# Patient Record
Sex: Female | Born: 1940 | Race: Black or African American | Hispanic: No | Marital: Married | Smoking: Never smoker
Health system: Southern US, Community
[De-identification: ages and names within clinical notes are randomized; demographics above are authoritative.]

## PROBLEM LIST (undated history)

## (undated) DIAGNOSIS — I10 Essential (primary) hypertension: Secondary | ICD-10-CM

## (undated) DIAGNOSIS — I2699 Other pulmonary embolism without acute cor pulmonale: Secondary | ICD-10-CM

## (undated) DIAGNOSIS — R011 Cardiac murmur, unspecified: Secondary | ICD-10-CM

## (undated) DIAGNOSIS — I82409 Acute embolism and thrombosis of unspecified deep veins of unspecified lower extremity: Secondary | ICD-10-CM

## (undated) DIAGNOSIS — M810 Age-related osteoporosis without current pathological fracture: Secondary | ICD-10-CM

## (undated) DIAGNOSIS — E7800 Pure hypercholesterolemia, unspecified: Secondary | ICD-10-CM

## (undated) DIAGNOSIS — M1990 Unspecified osteoarthritis, unspecified site: Secondary | ICD-10-CM

## (undated) HISTORY — DX: Age-related osteoporosis without current pathological fracture: M81.0

## (undated) HISTORY — PX: DILATION AND CURETTAGE OF UTERUS: SHX78

## (undated) HISTORY — PX: ABDOMINAL HYSTERECTOMY: SHX81

---
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
## 2007-11-06 ENCOUNTER — Emergency Department (HOSPITAL_COMMUNITY): Admission: EM | Admit: 2007-11-06 | Discharge: 2007-11-06 | Payer: Self-pay | Admitting: Emergency Medicine

## 2007-11-26 ENCOUNTER — Emergency Department (HOSPITAL_COMMUNITY): Admission: EM | Admit: 2007-11-26 | Discharge: 2007-11-26 | Payer: Self-pay | Admitting: Emergency Medicine

## 2008-04-08 ENCOUNTER — Emergency Department (HOSPITAL_COMMUNITY): Admission: EM | Admit: 2008-04-08 | Discharge: 2008-04-08 | Payer: Self-pay | Admitting: Emergency Medicine

## 2008-06-02 ENCOUNTER — Emergency Department (HOSPITAL_COMMUNITY): Admission: EM | Admit: 2008-06-02 | Discharge: 2008-06-02 | Payer: Self-pay | Admitting: Emergency Medicine

## 2011-03-21 DIAGNOSIS — D179 Benign lipomatous neoplasm, unspecified: Secondary | ICD-10-CM | POA: Insufficient documentation

## 2011-03-21 DIAGNOSIS — E7800 Pure hypercholesterolemia, unspecified: Secondary | ICD-10-CM | POA: Diagnosis present

## 2011-05-30 LAB — BASIC METABOLIC PANEL
BUN: 22
Calcium: 9.4
Creatinine, Ser: 1.37 — ABNORMAL HIGH
GFR calc Af Amer: 47 — ABNORMAL LOW
GFR calc non Af Amer: 38 — ABNORMAL LOW

## 2011-05-30 LAB — CBC
MCV: 88.8
Platelets: 176
RBC: 3.81 — ABNORMAL LOW
WBC: 5.4

## 2011-05-30 LAB — DIFFERENTIAL
Eosinophils Absolute: 0.1
Lymphocytes Relative: 9 — ABNORMAL LOW
Lymphs Abs: 0.5 — ABNORMAL LOW
Monocytes Relative: 9
Neutro Abs: 4.3
Neutrophils Relative %: 79 — ABNORMAL HIGH

## 2011-05-30 LAB — INFLUENZA A+B VIRUS AG-DIRECT(RAPID)
Inflenza A Ag: NEGATIVE
Influenza B Ag: NEGATIVE

## 2011-07-07 ENCOUNTER — Emergency Department (HOSPITAL_COMMUNITY): Payer: PRIVATE HEALTH INSURANCE

## 2011-07-07 ENCOUNTER — Encounter: Payer: Self-pay | Admitting: *Deleted

## 2011-07-07 ENCOUNTER — Emergency Department (HOSPITAL_COMMUNITY)
Admission: EM | Admit: 2011-07-07 | Discharge: 2011-07-07 | Disposition: A | Discharge status: Home / Self Care | Payer: PRIVATE HEALTH INSURANCE | Attending: Emergency Medicine | Admitting: Emergency Medicine

## 2011-07-07 DIAGNOSIS — S92919A Unspecified fracture of unspecified toe(s), initial encounter for closed fracture: Secondary | ICD-10-CM | POA: Insufficient documentation

## 2011-07-07 DIAGNOSIS — W2203XA Walked into furniture, initial encounter: Secondary | ICD-10-CM | POA: Insufficient documentation

## 2011-07-07 DIAGNOSIS — S92502A Displaced unspecified fracture of left lesser toe(s), initial encounter for closed fracture: Secondary | ICD-10-CM

## 2011-07-07 DIAGNOSIS — M79609 Pain in unspecified limb: Secondary | ICD-10-CM | POA: Insufficient documentation

## 2011-07-07 HISTORY — DX: Pure hypercholesterolemia, unspecified: E78.00

## 2011-07-07 NOTE — ED Notes (Signed)
Pt c/o injury to her left little toe last Friday. Pt states that it is swollen and painful.

## 2011-07-07 NOTE — ED Provider Notes (Signed)
Scribed for Raeford Razor, MD, the patient was seen in room APFT20/APFT20 . This chart was scribed by Ellie Lunch.   CSN: [id number] Arrival date & time: 07/07/2011  3:23 PM   First MD Initiated Contact with Patient 07/07/11 1621      Chief Complaint  Patient presents with  . Toe Injury    (Consider location/radiation/quality/duration/timing/severity/associated sxs/prior treatment) HPI Nicole Good is a 70 y.o. female who presents to the Emergency Department complaining of toe injury. Pt kicked a toe planter with her left foot ~10 days ago. Pt complains of pain in her 5th digit on her left foot. Pt describes pain as a constant soreness. Nothing makes pain better. Pain aggravated by high heels. Denies pain in any other digit or area. Pt has not treated with any pain medication. Pt has treated with epsom salt soak, alcohol, and cold compress with no improvement. There are no other associated symptoms and no other alleviating or aggravating factors.     Past Medical History  Diagnosis Date  . High cholesterol     Past Surgical History  Procedure Date  . Dilitation and curretage   . Abdominal hysterectomy     History reviewed. No pertinent family history.  History  Substance Use Topics  . Smoking status: Never Smoker   . Smokeless tobacco: Not on file  . Alcohol Use: No    Review of Systems 10 Systems reviewed and are negative for acute change except as noted in the HPI.   Allergies  Review of patient's allergies indicates no known allergies.  Home Medications   Current Outpatient Rx  Name Route Sig Dispense Refill  . ATORVASTATIN CALCIUM 10 MG PO TABS Oral Take 10 mg by mouth at bedtime.      . METHYLCELLULOSE 1 % OP SOLN Both Eyes Place 1 drop into both eyes as needed. For dry eye relief     . THERA M PLUS PO TABS Oral Take 1 tablet by mouth daily.        BP 113/88  Pulse 115  Temp(Src) 98.3 F (36.8 C) (Oral)  Resp 20  Ht 5\' 5"  (1.651 m)  Wt 170 lb  (77.111 kg)  BMI 28.29 kg/m2  SpO2 100%  Physical Exam  Nursing note and vitals reviewed. Constitutional: She is oriented to person, place, and time. She appears well-developed and well-nourished.  HENT:  Head: Normocephalic and atraumatic.  Eyes: Conjunctivae and EOM are normal.  Neck: Normal range of motion. Neck supple.  Cardiovascular: Normal rate, regular rhythm and normal heart sounds.   Pulmonary/Chest: Effort normal and breath sounds normal.  Abdominal: Soft. There is no tenderness.  Musculoskeletal: Normal range of motion.       Mild tenderness 5th digit of left foot. No appreciable swelling or erythema. Good flexion and extension. Neurovascularly intact.   Neurological: She is alert and oriented to person, place, and time.  Skin: Skin is warm and dry.  Psychiatric: She has a normal mood and affect.    ED Course  Procedures (including critical care time)  OTHER DATA REVIEWED: Nursing notes, vital signs, and past medical records reviewed.  DIAGNOSTIC STUDIES: Oxygen Saturation is 100% on room air, normal by my interpretation.    Dg Toe 5th Left  07/07/2011  *RADIOLOGY REPORT*  Clinical Data: Left little toe pain after stubbing toe on 07/04/2011  LEFT TOE - 2+ VIEW  Comparison: None  Findings: Three views are performed, showing an oblique fracture of the proximal phalanx of the fifth  digit.  There is deformity of the fifth metatarsal, consistent with old injury.  There is soft tissue swelling of the fifth digit.  No radiopaque foreign body or soft tissue gas.  IMPRESSION:  Acute fracture of the proximal phalanx of the fifth digit.  Original Report Authenticated By: Patterson Hammersmith, M.D.   4:29 PM EDP at PT bedside discussed with PT that xray shows a fracture in 5th digit. Discussed plan to buddy tape 5th digit and  discharge.    1. Fracture of fifth toe, left, closed       MDM  70yF with closed fx of proximal phalanx L 5th toe. Pt declining pain meds. Instructed  to take buddy tape, wear hard soled shoes and tylenol/nsaids prn pain. Ortho referral given if needed.   I personally preformed the services scribed in my presence. The recorded information has been reviewed and considered. Raeford Razor, MD.        Raeford Razor, MD 07/07/11 [phone]

## 2011-12-03 ENCOUNTER — Encounter (HOSPITAL_COMMUNITY): Payer: Self-pay

## 2011-12-03 ENCOUNTER — Emergency Department (HOSPITAL_COMMUNITY)
Admission: EM | Admit: 2011-12-03 | Discharge: 2011-12-03 | Disposition: A | Discharge status: Home / Self Care | Payer: Medicare PPO | Attending: Emergency Medicine | Admitting: Emergency Medicine

## 2011-12-03 ENCOUNTER — Other Ambulatory Visit: Payer: Self-pay

## 2011-12-03 DIAGNOSIS — I499 Cardiac arrhythmia, unspecified: Secondary | ICD-10-CM | POA: Insufficient documentation

## 2011-12-03 DIAGNOSIS — R002 Palpitations: Secondary | ICD-10-CM

## 2011-12-03 DIAGNOSIS — G4700 Insomnia, unspecified: Secondary | ICD-10-CM

## 2011-12-03 DIAGNOSIS — E7800 Pure hypercholesterolemia, unspecified: Secondary | ICD-10-CM | POA: Insufficient documentation

## 2011-12-03 LAB — COMPREHENSIVE METABOLIC PANEL
AST: 22 U/L (ref 0–37)
Albumin: 4 g/dL (ref 3.5–5.2)
Alkaline Phosphatase: 76 U/L (ref 39–117)
BUN: 25 mg/dL — ABNORMAL HIGH (ref 6–23)
Potassium: 4 mEq/L (ref 3.5–5.1)
Total Protein: 7.3 g/dL (ref 6.0–8.3)

## 2011-12-03 MED ORDER — ZOLPIDEM TARTRATE 5 MG PO TABS: 5.0000 mg | ORAL_TABLET | Freq: Every evening | ORAL | Status: DC | PRN

## 2011-12-03 NOTE — Discharge Instructions (Signed)
Make an appointment with either the cardiologist or your primary care doctor to arrange a Holter monitor to evaluate or extra heart beats.  Palpitations  A palpitation is the feeling that your heartbeat is irregular or is faster than normal. Although this is frightening, it usually is not serious. Palpitations may be caused by excesses of smoking, caffeine, or alcohol. They are also brought on by stress and anxiety. Sometimes, they are caused by heart disease. Unless otherwise noted, your caregiver did not find any signs of serious illness at this time. HOME CARE INSTRUCTIONS  To help prevent palpitations:  Drink decaffeinated coffee, tea, and soda pop. Avoid chocolate.   If you smoke or drink alcohol, quit or cut down as much as possible.   Reduce your stress or anxiety level. Biofeedback, yoga, or meditation will help you relax. Physical activity such as swimming, jogging, or walking also may be helpful.  SEEK MEDICAL CARE IF:   You continue to have a fast heartbeat.   Your palpitations occur more often.  SEEK IMMEDIATE MEDICAL CARE IF: You develop chest pain, shortness of breath, severe headache, dizziness, or fainting. Document Released: 08/22/2000 Document Revised: 08/14/2011 Document Reviewed: 10/22/2007 [facility] Patient Information 2012 [place], [place].  Insomnia Insomnia is frequent trouble falling and/or staying asleep. Insomnia can be a long term problem or a short term problem. Both are common. Insomnia can be a short term problem when the wakefulness is related to a certain stress or worry. Long term insomnia is often related to ongoing stress during waking hours and/or poor sleeping habits. Overtime, sleep deprivation itself can make the problem worse. Every little thing feels more severe because you are overtired and your ability to cope is decreased. CAUSES   Stress, anxiety, and depression.   Poor sleeping habits.   Distractions such as TV in the bedroom.   Naps close  to bedtime.   Engaging in emotionally charged conversations before bed.   Technical reading before sleep.   Alcohol and other sedatives. They may make the problem worse. They can hurt normal sleep patterns and normal dream activity.   Stimulants such as caffeine for several hours prior to bedtime.   Pain syndromes and shortness of breath can cause insomnia.   Exercise late at night.   Changing time zones may cause sleeping problems (jet lag).  It is sometimes helpful to have someone observe your sleeping patterns. They should look for periods of not breathing during the night (sleep apnea). They should also look to see how long those periods last. If you live alone or observers are uncertain, you can also be observed at a sleep clinic where your sleep patterns will be professionally monitored. Sleep apnea requires a checkup and treatment. Give your caregivers your medical history. Give your caregivers observations your family has made about your sleep.  SYMPTOMS   Not feeling rested in the morning.   Anxiety and restlessness at bedtime.   Difficulty falling and staying asleep.  TREATMENT   Your caregiver may prescribe treatment for an underlying medical disorders. Your caregiver can give advice or help if you are using alcohol or other drugs for self-medication. Treatment of underlying problems will usually eliminate insomnia problems.   Medications can be prescribed for short time use. They are generally not recommended for lengthy use.   Over-the-counter sleep medicines are not recommended for lengthy use. They can be habit forming.   You can promote easier sleeping by making lifestyle changes such as:   Using relaxation techniques  that help with breathing and reduce muscle tension.   Exercising earlier in the day.   Changing your diet and the time of your last meal. No night time snacks.   Establish a regular time to go to bed.   Counseling can help with stressful problems  and worry.   Soothing music and white noise may be helpful if there are background noises you cannot remove.   Stop tedious detailed work at least one hour before bedtime.  HOME CARE INSTRUCTIONS   Keep a diary. Inform your caregiver about your progress. This includes any medication side effects. See your caregiver regularly. Take note of:   Times when you are asleep.   Times when you are awake during the night.   The quality of your sleep.   How you feel the next day.  This information will help your caregiver care for you.  Get out of bed if you are still awake after 15 minutes. Read or do some quiet activity. Keep the lights down. Wait until you feel sleepy and go back to bed.   Keep regular sleeping and waking hours. Avoid naps.   Exercise regularly.   Avoid distractions at bedtime. Distractions include watching television or engaging in any intense or detailed activity like attempting to balance the household checkbook.   Develop a bedtime ritual. Keep a familiar routine of bathing, brushing your teeth, climbing into bed at the same time each night, listening to soothing music. Routines increase the success of falling to sleep faster.   Use relaxation techniques. This can be using breathing and muscle tension release routines. It can also include visualizing peaceful scenes. You can also help control troubling or intruding thoughts by keeping your mind occupied with boring or repetitive thoughts like the old concept of counting sheep. You can make it more creative like imagining planting one beautiful flower after another in your backyard garden.   During your day, work to eliminate stress. When this is not possible use some of the previous suggestions to help reduce the anxiety that accompanies stressful situations.  MAKE SURE YOU:   Understand these instructions.   Will watch your condition.   Will get help right away if you are not doing well or get worse.  Document  Released: 08/22/2000 Document Revised: 08/14/2011 Document Reviewed: 09/22/2007 [facility] Patient Information 2012 [place], [place].  Zolpidem tablets What is this medicine? ZOLPIDEM (zole PI dem) is used to treat insomnia. This medicine helps you to fall asleep and sleep through the night. This medicine may be used for other purposes; ask your health care provider or pharmacist if you have questions. What should I tell my health care provider before I take this medicine? They need to know if you have any of these conditions: -depression -history of a drug or alcohol abuse problem -liver disease -lung or breathing disease -suicidal thoughts -an unusual or allergic reaction to zolpidem, other medicines, foods, dyes, or preservatives -pregnant or trying to get pregnant -breast-feeding How should I use this medicine? Take this medicine by mouth with a glass of water. Follow the directions on the prescription label. It is better to take this medicine on an empty stomach and only when you are ready for bed. Do not take your medicine more often than directed. If you have been taking this medicine for several weeks and suddenly stop taking it, you may get unpleasant withdrawal symptoms. Your doctor or health care professional may want to gradually reduce the dose. Do not stop  taking this medicine on your own. Always follow your doctor or health care professional's advice. A special MedGuide will be given to you by the pharmacist with each prescription and refill. Be sure to read this information carefully each time. Talk to your pediatrician regarding the use of this medicine in children. Special care may be needed. Overdosage: If you think you have taken too much of this medicine contact a poison control center or emergency room at once. NOTE: This medicine is only for you. Do not share this medicine with others. What if I miss a dose? This does not apply. This medicine should only be taken  immediately before going to sleep. Do not take double or extra doses. What may interact with this medicine? -herbal medicines like kava kava, melatonin, St. John's wort and valerian -medicines for fungal infections like ketoconazole, fluconazole, or itraconazole -medicines for treating depression or other mental problems -other medicines given for sleep -some medicines for Parkinson' s disease or other movement disorders -some medicines used to treat HIV infection or AIDS, like ritonavir This list may not describe all possible interactions. Give your health care provider a list of all the medicines, herbs, non-prescription drugs, or dietary supplements you use. Also tell them if you smoke, drink alcohol, or use illegal drugs. Some items may interact with your medicine. What should I watch for while using this medicine? Visit your doctor or health care professional for regular checks on your progress. Keep a regular sleep schedule by going to bed at about the same time each night. Avoid caffeine-containing drinks in the evening hours. When sleep medicines are used every night for more than a few weeks, they may stop working. Talk to your doctor if you still have trouble sleeping. Do not take this medicine unless you are able to get a full night's sleep before you must be active again. You may not be able to remember things that you do in the hours after you take this medicine. Some people have reported driving, making phone calls, or preparing and eating food while asleep after taking sleep medicine. Take this medicine right before going to sleep. Tell your doctor if you are have any problems with your memory. After you stop taking this medicine, you may have trouble falling asleep. This is called rebound insomnia. This problem usually goes away on its own after 1 or 2 nights. You may get drowsy or dizzy. Do not drive, use machinery, or do anything that needs mental alertness until you know how this  medicine affects you. Do not stand or sit up quickly, especially if you are an older patient. This reduces the risk of dizzy or fainting spells. Alcohol may interfere with the effect of this medicine. Avoid alcoholic drinks. If you or your family notice any changes in your behavior, or if you have any unusual or disturbing thoughts, call your doctor right away. What side effects may I notice from receiving this medicine? Side effects that you should report to your doctor or health care professional as soon as possible: -allergic reactions like skin rash, itching or hives, swelling of the face, lips, or tongue -changes in vision -confusion -depressed mood -feeling faint or lightheaded, falls -hallucinations -problems with balance, speaking, walking -restlessness, excitability, or feelings of agitation -unusual activities while asleep like driving, eating, making phone calls Side effects that usually do not require medical attention (report to your doctor or health care professional if they continue or are bothersome): -diarrhea -dizziness, or daytime drowsiness, sometimes  called a hangover effect -headache This list may not describe all possible side effects. Call your doctor for medical advice about side effects. You may report side effects to FDA at 1-800-FDA-1088. Where should I keep my medicine? Keep out of the reach of children. This medicine can be abused. Keep your medicine in a safe place to protect it from theft. Do not share this medicine with anyone. Selling or giving away this medicine is dangerous and against the law. Store at room temperature between 20 and 25 degrees C (68 and 77 degrees F). Throw away any unused medicine after the expiration date. NOTE: This sheet is a summary. It may not cover all possible information. If you have questions about this medicine, talk to your doctor, pharmacist, or health care provider.  2012, [employer]. (02/28/2008 9:46:47 PM)

## 2011-12-03 NOTE — ED Notes (Signed)
Pt reports her father recently died and for the past 5 days has noticed her heat "skipping beats" intermittently.  Reports has been feeling very sleepy and tired and is  Unusual for her.  Denies cp, sob, or dizziness.

## 2011-12-03 NOTE — ED Provider Notes (Signed)
History     CSN: [id number]  Arrival date & time 12/03/11  1253   First MD Initiated Contact with Patient 12/03/11 1312      Chief Complaint  Patient presents with  . Irregular Heart Beat    (Consider location/radiation/quality/duration/timing/severity/associated sxs/prior treatment) The history is provided by the patient.   71 year old female has noticed extra heartbeats for the last 5 days. They are coming rather infrequently averaging about one hour. There is no associated chest pain, heaviness, tightness, or pressure. She denies dizziness or lightheadedness. She denies dyspnea, nausea, vomiting, diaphoresis. She has been under stress for the last several months following the death of her father and states that she has not been sleeping well during this time. She denies depression, crying spells, anhedonia. She has noted that the extra beats do not seem to occur when she is active. She's not had problems with extra beats before.  Past Medical History  Diagnosis Date  . High cholesterol     Past Surgical History  Procedure Date  . Dilitation and curretage   . Abdominal hysterectomy     No family history on file.  History  Substance Use Topics  . Smoking status: Never Smoker   . Smokeless tobacco: Not on file  . Alcohol Use: No    OB History    Grav Para Term Preterm Abortions TAB SAB Ect Mult Living                  Review of Systems  All other systems reviewed and are negative.    Allergies  Review of patient's allergies indicates no known allergies.  Home Medications   Current Outpatient Rx  Name Route Sig Dispense Refill  . ATORVASTATIN CALCIUM 10 MG PO TABS Oral Take 10 mg by mouth at bedtime.      . METHYLCELLULOSE 1 % OP SOLN Both Eyes Place 1 drop into both eyes as needed. For dry eye relief     . THERA M PLUS PO TABS Oral Take 1 tablet by mouth daily.        BP 123/78  Pulse 71  Temp 98.1 F (36.7 C)  Resp 20  Ht 5\' 5"  (1.651 m)  Wt 178 lb  (80.74 kg)  BMI 29.62 kg/m2  SpO2 97%  Physical Exam  Nursing note and vitals reviewed.  70 year old female who is resting comfortably and in no acute distress. Vital signs are normal. Oxygen saturation is 97% which is normal. Head is normocephalic and atraumatic. PERRLA, EOMI. Oropharynx is clear. Neck is nontender and supple without adenopathy or JVD. There is no thyromegaly. Lungs are clear without rales, wheezes, or rhonchi. Heart has regular rate and rhythm without murmur. Abdomen is soft, flat, nontender without masses or hepatosplenomegaly. Extremities have no cyanosis or edema, full range of motion is present. Skin is warm and dry without rash. Neurologic: Mental status is normal, cranial nerves are intact, there are no focal motor or sensory deficits.  ED Course  Procedures (including critical care time)  Results for orders placed during the hospital encounter of 12/03/11  COMPREHENSIVE METABOLIC PANEL      Component Value Range   Sodium 137  135 - 145 (mEq/L)   Potassium 4.0  3.5 - 5.1 (mEq/L)   Chloride 99  96 - 112 (mEq/L)   CO2 28  19 - 32 (mEq/L)   Glucose, Bld 108 (*) 70 - 99 (mg/dL)   BUN 25 (*) 6 - 23 (mg/dL)   Creatinine, Ser  1.24 (*) 0.50 - 1.10 (mg/dL)   Calcium 19.1  8.4 - 10.5 (mg/dL)   Total Protein 7.3  6.0 - 8.3 (g/dL)   Albumin 4.0  3.5 - 5.2 (g/dL)   AST 22  0 - 37 (U/L)   ALT 22  0 - 35 (U/L)   Alkaline Phosphatase 76  39 - 117 (U/L)   Total Bilirubin 0.3  0.3 - 1.2 (mg/dL)   GFR calc non Af Amer 43 (*) >90 (mL/min)   GFR calc Af Amer 49 (*) >90 (mL/min)     Date: 12/03/2011  Rate: 67  Rhythm: normal sinus rhythm  QRS Axis: normal  Intervals: normal  ST/T Wave abnormalities: normal  Conduction Disutrbances:none  Narrative Interpretation: Borderline left atrial hypertrophy, otherwise normal ECG. No old ECG available for comparison.  Old EKG Reviewed: none available  Electrolytes are normal. Mild renal insufficiency is unchanged from baseline. No  evidence of hepatic dysfunction. She will be given a prescription for Ambien to treat her sleeping difficulty, and referred to cardiology to arrange for a Holter monitor. She's also given the option of having a Holter monitor arranged through her primary care provider.  1. Palpitations   2. Insomnia       MDM  Probable symptomatic PVCs. Insomnia. Electrolytes will be checked. Since she is on a statin, I will order a complete metabolic panel to also evaluate for any possible liver dysfunction.        Dione Booze, MD 12/03/11 1414

## 2011-12-03 NOTE — ED Notes (Signed)
Pt denies pain but states she has noticed an intermittent extra "thump" x 5 days. NAD.

## 2012-02-05 DIAGNOSIS — Z6826 Body mass index (BMI) 26.0-26.9, adult: Secondary | ICD-10-CM | POA: Insufficient documentation

## 2012-09-23 ENCOUNTER — Emergency Department (HOSPITAL_COMMUNITY)
Admission: EM | Admit: 2012-09-23 | Discharge: 2012-09-23 | Disposition: A | Discharge status: Home / Self Care | Payer: Medicare PPO | Attending: Emergency Medicine | Admitting: Emergency Medicine

## 2012-09-23 ENCOUNTER — Encounter (HOSPITAL_COMMUNITY): Payer: Self-pay | Admitting: *Deleted

## 2012-09-23 ENCOUNTER — Emergency Department (HOSPITAL_COMMUNITY): Payer: Medicare PPO

## 2012-09-23 DIAGNOSIS — R5381 Other malaise: Secondary | ICD-10-CM | POA: Insufficient documentation

## 2012-09-23 DIAGNOSIS — Z7982 Long term (current) use of aspirin: Secondary | ICD-10-CM | POA: Insufficient documentation

## 2012-09-23 DIAGNOSIS — R42 Dizziness and giddiness: Secondary | ICD-10-CM | POA: Insufficient documentation

## 2012-09-23 DIAGNOSIS — R531 Weakness: Secondary | ICD-10-CM

## 2012-09-23 DIAGNOSIS — Z79899 Other long term (current) drug therapy: Secondary | ICD-10-CM | POA: Insufficient documentation

## 2012-09-23 DIAGNOSIS — I10 Essential (primary) hypertension: Secondary | ICD-10-CM | POA: Insufficient documentation

## 2012-09-23 DIAGNOSIS — E7800 Pure hypercholesterolemia, unspecified: Secondary | ICD-10-CM | POA: Insufficient documentation

## 2012-09-23 HISTORY — DX: Essential (primary) hypertension: I10

## 2012-09-23 LAB — URINALYSIS, ROUTINE W REFLEX MICROSCOPIC
Hgb urine dipstick: NEGATIVE
Leukocytes, UA: NEGATIVE
Nitrite: NEGATIVE
Protein, ur: NEGATIVE mg/dL
Urobilinogen, UA: 0.2 mg/dL (ref 0.0–1.0)

## 2012-09-23 LAB — COMPREHENSIVE METABOLIC PANEL
BUN: 22 mg/dL (ref 6–23)
CO2: 30 mEq/L (ref 19–32)
Calcium: 10.4 mg/dL (ref 8.4–10.5)
Creatinine, Ser: 1.27 mg/dL — ABNORMAL HIGH (ref 0.50–1.10)
GFR calc Af Amer: 48 mL/min — ABNORMAL LOW (ref >90)
GFR calc non Af Amer: 41 mL/min — ABNORMAL LOW (ref >90)
Glucose, Bld: 99 mg/dL (ref 70–99)

## 2012-09-23 LAB — CBC WITH DIFFERENTIAL/PLATELET
Eosinophils Relative: 7 % — ABNORMAL HIGH (ref 0–5)
HCT: 37.9 % (ref 36.0–46.0)
Lymphocytes Relative: 23 % (ref 12–46)
Lymphs Abs: 1.3 10*3/uL (ref 0.7–4.0)
MCH: 30.7 pg (ref 26.0–34.0)
MCV: 92.2 fL (ref 78.0–100.0)
Monocytes Absolute: 0.3 10*3/uL (ref 0.1–1.0)
RBC: 4.11 MIL/uL (ref 3.87–5.11)
WBC: 5.5 10*3/uL (ref 4.0–10.5)

## 2012-09-23 LAB — TROPONIN I: Troponin I: <0.3 ng/mL (ref <0.30)

## 2012-09-23 NOTE — ED Notes (Signed)
Pt alert & oriented x4, stable gait. Patient given discharge instructions, paperwork & prescription(s). Patient  instructed to stop at the registration desk to finish any additional paperwork. Patient verbalized understanding. Pt left department w/ no further questions. 

## 2012-09-23 NOTE — ED Provider Notes (Signed)
History     CSN: [id number]  Arrival date & time 09/23/12  1420   First MD Initiated Contact with Patient 09/23/12 1610      Chief Complaint  Patient presents with  . Dizziness  . Weakness    (Consider location/radiation/quality/duration/timing/severity/associated sxs/prior treatment) HPI....generalized weakness for approximately 8 days. Slight dizziness. Generally, patient is in good health.  Nothing makes symptoms better or worse. Severity is moderate.   No fever, sweats, chills, dysuria, cough, dyspnea, stiff neck, weight loss.  Past Medical History  Diagnosis Date  . High cholesterol   . Hypertension     Past Surgical History  Procedure Date  . Dilitation and curretage   . Abdominal hysterectomy     No family history on file.  History  Substance Use Topics  . Smoking status: Never Smoker   . Smokeless tobacco: Not on file  . Alcohol Use: No    OB History    Grav Para Term Preterm Abortions TAB SAB Ect Mult Living                  Review of Systems  All other systems reviewed and are negative.    Allergies  Review of patient's allergies indicates no known allergies.  Home Medications   Current Outpatient Rx  Name  Route  Sig  Dispense  Refill  . ASPIRIN EC 81 MG PO TBEC   Oral   Take 81 mg by mouth every morning.          . ATORVASTATIN CALCIUM 10 MG PO TABS   Oral   Take 10 mg by mouth at bedtime.          . CHOLECALCIFEROL 400 UNITS PO TABS   Oral   Take 400 Units by mouth every morning.          Marland Kitchen OMEGA-3 FATTY ACIDS 1000 MG PO CAPS   Oral   Take 1 g by mouth daily.         Marland Kitchen HYDROCHLOROTHIAZIDE 25 MG PO TABS   Oral   Take 25 mg by mouth every morning.          Carma Leaven M PLUS PO TABS   Oral   Take 1 tablet by mouth every morning.            BP 150/65  Pulse 54  Temp 98 F (36.7 C) (Oral)  Resp 20  Ht 5\' 6"  (1.676 m)  Wt 180 lb (81.647 kg)  BMI 29.05 kg/m2  SpO2 95%  Physical Exam  Nursing note and vitals  reviewed. Constitutional: She is oriented to person, place, and time. She appears well-developed and well-nourished.  HENT:  Head: Normocephalic and atraumatic.  Eyes: Conjunctivae normal and EOM are normal. Pupils are equal, round, and reactive to light.  Neck: Normal range of motion. Neck supple.  Cardiovascular: Normal rate, regular rhythm and normal heart sounds.   Pulmonary/Chest: Effort normal and breath sounds normal.  Abdominal: Soft. Bowel sounds are normal.  Musculoskeletal: Normal range of motion.  Neurological: She is alert and oriented to person, place, and time.  Skin: Skin is warm and dry.  Psychiatric: She has a normal mood and affect.    ED Course  Procedures (including critical care time)  Labs Reviewed  CBC WITH DIFFERENTIAL - Abnormal; Notable for the following:    Eosinophils Relative 7 (*)     All other components within normal limits  COMPREHENSIVE METABOLIC PANEL - Abnormal; Notable for the following:  Creatinine, Ser 1.27 (*)     Total Bilirubin 0.1 (*)     GFR calc non Af Amer 41 (*)     GFR calc Af Amer 48 (*)     All other components within normal limits  TROPONIN I  URINALYSIS, ROUTINE W REFLEX MICROSCOPIC  TSH   Dg Chest 2 View  09/23/2012  *RADIOLOGY REPORT*  Clinical Data: Dizziness, weakness.  Flu-like symptoms.  CHEST - 2 VIEW  Comparison: 11/06/2007  Findings: Heart is upper limits normal in size.  Lungs are clear. No effusions or acute bony abnormality.  IMPRESSION: No acute cardiopulmonary disease.   Original Report Authenticated By: Charlett Nose, M.D.      1. Weakness       MDM  Patient has normal physical exam. Screening test negative. His primary care followup. No neuro deficits       Donnetta Hutching, MD 09/23/12 [phone]

## 2012-09-23 NOTE — ED Notes (Signed)
Pt states for past 8-9 days she "hasn't felt right" and c/o generalized weakness and intermittent dizziness. Pt denies any pain, SOB, NVD, headaches. Pt states "I just stay tired".

## 2012-09-23 NOTE — ED Notes (Signed)
Pt states that for the past two weeks she has been weak, off balance, dizzy, denies any pain ,sob, recent illness. Denies any weakness on one side vs other, no problems with thought process or speech.

## 2012-09-24 LAB — TSH: TSH: 0.576 u[IU]/mL (ref 0.350–4.500)

## 2013-06-01 ENCOUNTER — Emergency Department (HOSPITAL_COMMUNITY): Payer: Medicare PPO

## 2013-06-01 ENCOUNTER — Emergency Department (HOSPITAL_COMMUNITY)
Admission: EM | Admit: 2013-06-01 | Discharge: 2013-06-01 | Disposition: A | Discharge status: Home / Self Care | Payer: Medicare PPO | Attending: Emergency Medicine | Admitting: Emergency Medicine

## 2013-06-01 ENCOUNTER — Encounter (HOSPITAL_COMMUNITY): Payer: Self-pay | Admitting: Emergency Medicine

## 2013-06-01 DIAGNOSIS — K5900 Constipation, unspecified: Secondary | ICD-10-CM

## 2013-06-01 DIAGNOSIS — Z7982 Long term (current) use of aspirin: Secondary | ICD-10-CM | POA: Diagnosis not present

## 2013-06-01 DIAGNOSIS — E7800 Pure hypercholesterolemia, unspecified: Secondary | ICD-10-CM | POA: Diagnosis not present

## 2013-06-01 DIAGNOSIS — K649 Unspecified hemorrhoids: Secondary | ICD-10-CM | POA: Insufficient documentation

## 2013-06-01 DIAGNOSIS — Z79899 Other long term (current) drug therapy: Secondary | ICD-10-CM | POA: Diagnosis not present

## 2013-06-01 DIAGNOSIS — I10 Essential (primary) hypertension: Secondary | ICD-10-CM | POA: Insufficient documentation

## 2013-06-01 MED ORDER — FLEET ENEMA 7-19 GM/118ML RE ENEM
1.0000 | ENEMA | Freq: Once | RECTAL | Status: AC
  Administered 2013-06-01: 1 via RECTAL

## 2013-06-01 NOTE — ED Notes (Signed)
Fleet enema given by Dr. Adriana Simas with RN at bedside. Pt had small BM shortly after BM was given. Pt tolerated procedure well.

## 2013-06-01 NOTE — ED Provider Notes (Signed)
CSN: [id number]     Arrival date & time 06/01/13  1111 History  This chart was scribed for Nicole Hutching, MD by Nicole Good, ED scribe.  This patient was seen in room [facility] and the patient's care was started at 11:50 AM.  Chief Complaint  Patient presents with  . Constipation  . Hemorrhoids    The history is provided by the patient. No language interpreter was used.    HPI Comments: Nicole Good is a 72 y.o. female who presents to the Emergency Department complaining of constipation that has been present intermittently for 2-3 months and most recently has been ongoing for one week.  Pt states that she last had a normal BM 1 week ago and since then has had only sporadic small stools and has been straining.  Her last BM was yesterday,  She has attempted to treat constipation with prune juice and stool softener, without relief.  Pt also expresses concern she may have hemorrhoids secondary to her constipation.  She notes that she received an enema 5 weeks ago.   Past Medical History  Diagnosis Date  . High cholesterol   . Hypertension     Past Surgical History  Procedure Laterality Date  . Dilitation and curretage    . Abdominal hysterectomy      Family History  Problem Relation Age of Onset  . Stroke Mother     History  Substance Use Topics  . Smoking status: Never Smoker   . Smokeless tobacco: Never Used  . Alcohol Use: No    OB History   Grav Para Term Preterm Abortions TAB SAB Ect Mult Living   3 1 1  2  1 1  1       Review of Systems A complete 10 system review of systems was obtained and all systems are negative except as noted in the HPI and PMH.    Allergies  Review of patient's allergies indicates no known allergies.  Home Medications   Current Outpatient Rx  Name  Route  Sig  Dispense  Refill  . aspirin EC 81 MG tablet   Oral   Take 81 mg by mouth every morning.          Marland Kitchen atorvastatin (LIPITOR) 10 MG tablet   Oral   Take 10 mg by  mouth at bedtime.          . cholecalciferol (VITAMIN D) 400 UNITS TABS   Oral   Take 400 Units by mouth every morning.          . fish oil-omega-3 fatty acids 1000 MG capsule   Oral   Take 1 g by mouth daily.         . hydrochlorothiazide (HYDRODIURIL) 25 MG tablet   Oral   Take 25 mg by mouth every morning.          . Multiple Vitamins-Minerals (MULTIVITAMINS THER. W/MINERALS) TABS   Oral   Take 1 tablet by mouth every morning.           BP 122/67  Pulse 73  Temp(Src) 98 F (36.7 C) (Oral)  Resp 18  Ht 5' 5.5" (1.664 m)  Wt 162 lb (73.483 kg)  BMI 26.54 kg/m2  SpO2 100%  Physical Exam  Nursing note and vitals reviewed. Constitutional: She is oriented to person, place, and time. She appears well-developed and well-nourished.  HENT:  Head: Normocephalic and atraumatic.  Eyes: Conjunctivae and EOM are normal. Pupils are equal, round, and reactive to  light.  Neck: Normal range of motion. Neck supple.  Cardiovascular: Normal rate, regular rhythm and normal heart sounds.   Pulmonary/Chest: Effort normal and breath sounds normal.  Abdominal: Soft. Bowel sounds are normal. There is no tenderness.  Musculoskeletal: Normal range of motion.  Neurological: She is alert and oriented to person, place, and time.  Skin: Skin is warm and dry.  Psychiatric: She has a normal mood and affect.    ED Course  Procedures (including critical care time)  DIAGNOSTIC STUDIES: Oxygen Saturation is 100% on room air, normal by my interpretation.    COORDINATION OF CARE: 11:56 AM-Discussed treatment plan which includes abdomen x-ray and rectal exam with pt at bedside and pt agreed to plan.   Results for orders placed during the hospital encounter of 09/23/12  CBC WITH DIFFERENTIAL      Result Value Range   WBC 5.5  4.0 - 10.5 K/uL   RBC 4.11  3.87 - 5.11 MIL/uL   Hemoglobin 12.6  12.0 - 15.0 g/dL   HCT 45.4  09.8 - 11.9 %   MCV 92.2  78.0 - 100.0 fL   MCH 30.7  26.0 - 34.0  pg   MCHC 33.2  30.0 - 36.0 g/dL   RDW 14.7  82.9 - 56.2 %   Platelets 200  150 - 400 K/uL   Neutrophils Relative % 64  43 - 77 %   Neutro Abs 3.5  1.7 - 7.7 K/uL   Lymphocytes Relative 23  12 - 46 %   Lymphs Abs 1.3  0.7 - 4.0 K/uL   Monocytes Relative 5  3 - 12 %   Monocytes Absolute 0.3  0.1 - 1.0 K/uL   Eosinophils Relative 7 (*) 0 - 5 %   Eosinophils Absolute 0.4  0.0 - 0.7 K/uL   Basophils Relative 0  0 - 1 %   Basophils Absolute 0.0  0.0 - 0.1 K/uL  COMPREHENSIVE METABOLIC PANEL      Result Value Range   Sodium 138  135 - 145 mEq/L   Potassium 4.1  3.5 - 5.1 mEq/L   Chloride 102  96 - 112 mEq/L   CO2 30  19 - 32 mEq/L   Glucose, Bld 99  70 - 99 mg/dL   BUN 22  6 - 23 mg/dL   Creatinine, Ser 1.30 (*) 0.50 - 1.10 mg/dL   Calcium 86.5  8.4 - 78.4 mg/dL   Total Protein 7.8  6.0 - 8.3 g/dL   Albumin 4.1  3.5 - 5.2 g/dL   AST 23  0 - 37 U/L   ALT 24  0 - 35 U/L   Alkaline Phosphatase 75  39 - 117 U/L   Total Bilirubin 0.1 (*) 0.3 - 1.2 mg/dL   GFR calc non Af Amer 41 (*) >90 mL/min   GFR calc Af Amer 48 (*) >90 mL/min  TROPONIN I      Result Value Range   Troponin I <0.30  <0.30 ng/mL  URINALYSIS, ROUTINE W REFLEX MICROSCOPIC      Result Value Range   Color, Urine YELLOW  YELLOW   APPearance CLEAR  CLEAR   Specific Gravity, Urine 1.010  1.005 - 1.030   pH 7.0  5.0 - 8.0   Glucose, UA NEGATIVE  NEGATIVE mg/dL   Hgb urine dipstick NEGATIVE  NEGATIVE   Bilirubin Urine NEGATIVE  NEGATIVE   Ketones, ur NEGATIVE  NEGATIVE mg/dL   Protein, ur NEGATIVE  NEGATIVE mg/dL  Urobilinogen, UA 0.2  0.0 - 1.0 mg/dL   Nitrite NEGATIVE  NEGATIVE   Leukocytes, UA NEGATIVE  NEGATIVE  TSH      Result Value Range   TSH 0.576  0.350 - 4.500 uIU/mL    Dg Abd 1 View  06/01/2013   CLINICAL DATA:  Constipation for 3 months, hemorrhoids  EXAM: ABDOMEN - 1 VIEW  COMPARISON:  None  FINDINGS: Increased stool throughout colon.  Nonobstructive bowel gas pattern with gas present to rectum.  No  definite bowel wall thickening.  Bones appear mildly demineralized.  Small left pelvic phlebolith.  IMPRESSION: Increased stool throughout colon.   Electronically Signed   By: Ulyses Southward M.D.   On: 06/01/2013 11:56      MDM  No diagnosis found.  No acute abdomen.  Digital exam shows no obvious  stool in rectum.   I personally performed the services described in this documentation, which was scribed in my presence. The recorded information has been reviewed and is accurate.    Nicole Hutching, MD 06/06/13 1655

## 2013-06-01 NOTE — ED Notes (Signed)
Per c/o constipation x6 months. Patient reports taking stool softner and prune juice with no relief. Per patient last BM yesterday, soft, but "not a lot of stool." Patient also reports hemorrhoids from the constipation.

## 2013-06-01 NOTE — ED Notes (Signed)
Pt reports constipation and possible hemorrhoid x5-6 months. Pt states she has been using prune juice and OTC stool softener for symptoms and states are "usually" effective. Pt's last BM was last night however she reports it was very small. Pt denies abdominal pain and also denies any blood in her stools.

## 2014-07-04 DIAGNOSIS — K642 Third degree hemorrhoids: Secondary | ICD-10-CM | POA: Insufficient documentation

## 2014-07-04 HISTORY — DX: Third degree hemorrhoids: K64.2

## 2014-07-10 ENCOUNTER — Encounter (HOSPITAL_COMMUNITY): Payer: Self-pay | Admitting: Emergency Medicine

## 2015-09-08 ENCOUNTER — Emergency Department (HOSPITAL_COMMUNITY): Payer: Medicare PPO

## 2015-09-08 ENCOUNTER — Emergency Department (HOSPITAL_COMMUNITY)
Admission: EM | Admit: 2015-09-08 | Discharge: 2015-09-08 | Disposition: A | Discharge status: Home / Self Care | Payer: Medicare PPO | Attending: Emergency Medicine | Admitting: Emergency Medicine

## 2015-09-08 ENCOUNTER — Encounter (HOSPITAL_COMMUNITY): Payer: Self-pay | Admitting: Emergency Medicine

## 2015-09-08 DIAGNOSIS — Z86711 Personal history of pulmonary embolism: Secondary | ICD-10-CM | POA: Insufficient documentation

## 2015-09-08 DIAGNOSIS — E7800 Pure hypercholesterolemia, unspecified: Secondary | ICD-10-CM | POA: Insufficient documentation

## 2015-09-08 DIAGNOSIS — J40 Bronchitis, not specified as acute or chronic: Secondary | ICD-10-CM

## 2015-09-08 DIAGNOSIS — R05 Cough: Secondary | ICD-10-CM | POA: Diagnosis present

## 2015-09-08 DIAGNOSIS — Z7982 Long term (current) use of aspirin: Secondary | ICD-10-CM | POA: Diagnosis not present

## 2015-09-08 DIAGNOSIS — J209 Acute bronchitis, unspecified: Secondary | ICD-10-CM | POA: Diagnosis not present

## 2015-09-08 DIAGNOSIS — Z79899 Other long term (current) drug therapy: Secondary | ICD-10-CM | POA: Diagnosis not present

## 2015-09-08 DIAGNOSIS — I10 Essential (primary) hypertension: Secondary | ICD-10-CM | POA: Insufficient documentation

## 2015-09-08 HISTORY — DX: Other pulmonary embolism without acute cor pulmonale: I26.99

## 2015-09-08 MED ORDER — AZITHROMYCIN 250 MG PO TABS: ORAL_TABLET | ORAL | Status: DC

## 2015-09-08 NOTE — ED Provider Notes (Signed)
CSN: [id number]     Arrival date & time 09/08/15  [id number] History   First MD Initiated Contact with Patient 09/08/15 [phone]     Chief Complaint  Patient presents with  . Cough     (Consider location/radiation/quality/duration/timing/severity/associated sxs/prior Treatment) Patient is a 74 y.o. female presenting with cough. The history is provided by the patient. No language interpreter was used.  Cough Cough characteristics:  Productive Sputum characteristics:  White Severity:  Moderate Onset quality:  Gradual Duration:  5 days Timing:  Constant Progression:  Worsening Chronicity:  New Smoker: no   Context: upper respiratory infection   Relieved by:  Cough suppressants Worsened by:  Nothing tried Ineffective treatments:  None tried Associated symptoms: no rhinorrhea   Risk factors: no recent infection   Pt complains of cough.  No fever  Past Medical History  Diagnosis Date  . High cholesterol   . Hypertension   . Pulmonary emboli [facility])    Past Surgical History  Procedure Laterality Date  . Dilitation and curretage    . Abdominal hysterectomy     Family History  Problem Relation Age of Onset  . Stroke Mother    Social History  Substance Use Topics  . Smoking status: Never Smoker   . Smokeless tobacco: Never Used  . Alcohol Use: No   OB History    Gravida Para Term Preterm AB TAB SAB Ectopic Multiple Living   3 1 1  2  1 1  1      Review of Systems  HENT: Negative for rhinorrhea.   Respiratory: Positive for cough.   All other systems reviewed and are negative.     Allergies  Review of patient's allergies indicates no known allergies.  Home Medications   Prior to Admission medications   Medication Sig Start Date End Date Taking? Authorizing Provider  aspirin EC 81 MG tablet Take 81 mg by mouth every morning.     Historical Provider, MD  atorvastatin (LIPITOR) 10 MG tablet Take 10 mg by mouth at bedtime.     Historical Provider, MD  cholecalciferol  (VITAMIN D) 400 UNITS TABS Take 400 Units by mouth every morning.     Historical Provider, MD  fish oil-omega-3 fatty acids 1000 MG capsule Take 1 g by mouth daily.    Historical Provider, MD  hydrochlorothiazide (HYDRODIURIL) 25 MG tablet Take 25 mg by mouth every morning.     Historical Provider, MD  Multiple Vitamins-Minerals (MULTIVITAMINS THER. W/MINERALS) TABS Take 1 tablet by mouth every morning.     Historical Provider, MD   BP 121/63 mmHg  Pulse 62  Temp(Src) 98.6 F (37 C) (Oral)  Resp 18  Ht 5\' 6"  (1.676 m)  Wt 73.483 kg  BMI 26.16 kg/m2  SpO2 99% Physical Exam  Constitutional: She is oriented to person, place, and time. She appears well-developed and well-nourished.  HENT:  Head: Normocephalic and atraumatic.  Eyes: Conjunctivae and EOM are normal. Pupils are equal, round, and reactive to light.  Neck: Normal range of motion.  Cardiovascular: Normal rate and normal heart sounds.   Pulmonary/Chest: Effort normal.  Abdominal: She exhibits no distension.  Musculoskeletal: Normal range of motion.  Neurological: She is alert and oriented to person, place, and time.  Skin: Skin is warm.  Psychiatric: She has a normal mood and affect.  Nursing note and vitals reviewed.   ED Course  Procedures (including critical care time) Labs Review Labs Reviewed - No data to display  Imaging Review Dg Chest  2 View  09/08/2015  CLINICAL DATA:  Cough.  Chest congestion starting on last Tuesday. EXAM: CHEST  2 VIEW COMPARISON:  09/23/2012 FINDINGS: Tortuous thoracic aorta with atherosclerotic calcification of the arch. Mild enlargement of the cardiopericardial silhouette, without edema. The lungs appear otherwise clear.  No pleural effusion. Based on a horizontal central retrocardiac lucency I suspect the presence of a small hiatal hernia. IMPRESSION: 1. Mild enlargement of the cardiopericardial silhouette, without edema. 2. Small hiatal hernia. 3. Atherosclerotic thoracic aorta.  Electronically Signed   By: Van Clines M.D.   On: 09/08/2015 08:39   I have personally reviewed and evaluated these images and lab results as part of my medical decision-making.   EKG Interpretation None      MDMnormal temp, normal pulse and 99%02.  I suspect bronchitis.  Pt advised to follow up with her MD for recheck this week   Final diagnoses:  Bronchitis    Meds ordered this encounter  Medications  . azithromycin (ZITHROMAX Z-PAK) 250 MG tablet    Sig: 2 tablets on first day, then 1 tablet a day on days 2-5    Dispense:  6 each    Refill:  0    Order Specific Question:  Supervising Provider    Answer:  Noemi Chapel [place], PA-C 09/08/15 [place], MD 09/08/15 [phone]

## 2015-09-08 NOTE — Discharge Instructions (Signed)

## 2015-09-08 NOTE — ED Notes (Signed)
Pt states that she has had a cough with white sputum since Tuesday.

## 2015-09-09 HISTORY — PX: CATARACT EXTRACTION W/ INTRAOCULAR LENS  IMPLANT, BILATERAL: SHX1307

## 2015-10-02 ENCOUNTER — Emergency Department (HOSPITAL_COMMUNITY)
Admission: EM | Admit: 2015-10-02 | Discharge: 2015-10-02 | Disposition: A | Discharge status: Home / Self Care | Payer: Medicare PPO | Attending: Emergency Medicine | Admitting: Emergency Medicine

## 2015-10-02 ENCOUNTER — Emergency Department (HOSPITAL_COMMUNITY): Payer: Medicare PPO

## 2015-10-02 ENCOUNTER — Encounter (HOSPITAL_COMMUNITY): Payer: Self-pay | Admitting: *Deleted

## 2015-10-02 DIAGNOSIS — Z7982 Long term (current) use of aspirin: Secondary | ICD-10-CM | POA: Insufficient documentation

## 2015-10-02 DIAGNOSIS — E7800 Pure hypercholesterolemia, unspecified: Secondary | ICD-10-CM | POA: Diagnosis not present

## 2015-10-02 DIAGNOSIS — M542 Cervicalgia: Secondary | ICD-10-CM | POA: Diagnosis present

## 2015-10-02 DIAGNOSIS — M4802 Spinal stenosis, cervical region: Secondary | ICD-10-CM | POA: Insufficient documentation

## 2015-10-02 DIAGNOSIS — R202 Paresthesia of skin: Secondary | ICD-10-CM | POA: Insufficient documentation

## 2015-10-02 DIAGNOSIS — I10 Essential (primary) hypertension: Secondary | ICD-10-CM | POA: Diagnosis not present

## 2015-10-02 DIAGNOSIS — Z79899 Other long term (current) drug therapy: Secondary | ICD-10-CM | POA: Insufficient documentation

## 2015-10-02 DIAGNOSIS — Z86711 Personal history of pulmonary embolism: Secondary | ICD-10-CM | POA: Diagnosis not present

## 2015-10-02 MED ORDER — DEXAMETHASONE 4 MG PO TABS: 4.0000 mg | ORAL_TABLET | Freq: Two times a day (BID) | ORAL | Status: DC

## 2015-10-02 MED ORDER — MELOXICAM 7.5 MG PO TABS: 7.5000 mg | ORAL_TABLET | Freq: Two times a day (BID) | ORAL | Status: DC

## 2015-10-02 NOTE — ED Notes (Signed)
Pt c/o right sided neck pain x 4 days. States "weird sensation continues from neck down to right arm and hand". Denies tingling/burning/numbness. Does report doing yard work last Thursday that involved cutting down trees.

## 2015-10-02 NOTE — Discharge Instructions (Signed)
The CT scan of his cervical spine is negative for fracture or dislocation, and there is no evidence of herniated disc. There is noted narrowing of the foramina, or the tract where the smaller branches of nerve, all the spinal cord area. Please use the Decadron 2 times daily, and Mobic 2 times daily after a meal. Please see Dr. Berenice Primas, or the orthopedic specialist of your choice for orthopedic evaluation of this issue.

## 2015-10-02 NOTE — ED Provider Notes (Signed)
CSN: [id number]     Arrival date & time 10/02/15  1218 History   First MD Initiated Contact with Patient 10/02/15 1244     Chief Complaint  Patient presents with  . Neck Pain     (Consider location/radiation/quality/duration/timing/severity/associated sxs/prior Treatment) HPI Comments: Patient is a 75 year old female who presents to the emergency department with right-sided neck pain.  The patient states that over the last 4 days she's been having increasing pain in the mid to lower right neck, and this is extending into the right arm and occasionally into the right hand. The patient denies dropping any objects. The patient denies problems using the hand. She states that one or 2 days when this started she was cutting down lamp's and small trees. She states that after this the problem seemed to have gotten worse. She has a sensation of pins and needles at times that goes from the neck to the shoulder and down the arm. No other problems noted. The patient particularly denies any weakness of the upper or lower extremities. There's been no difficulty with speech, or swallowing. The patient has no problems with balance or walking.  Patient is a 75 y.o. female presenting with neck pain. The history is provided by the patient.  Neck Pain Pain location:  R side   Past Medical History  Diagnosis Date  . High cholesterol   . Hypertension   . Pulmonary emboli [facility])    Past Surgical History  Procedure Laterality Date  . Dilitation and curretage    . Abdominal hysterectomy     Family History  Problem Relation Age of Onset  . Stroke Mother    Social History  Substance Use Topics  . Smoking status: Never Smoker   . Smokeless tobacco: Never Used  . Alcohol Use: No   OB History    Gravida Para Term Preterm AB TAB SAB Ectopic Multiple Living   3 1 1  2  1 1  1      Review of Systems  Musculoskeletal: Positive for arthralgias and neck pain.  All other systems reviewed and are  negative.     Allergies  Review of patient's allergies indicates no known allergies.  Home Medications   Prior to Admission medications   Medication Sig Start Date End Date Taking? Authorizing Provider  aspirin EC 81 MG tablet Take 81 mg by mouth every morning.    Yes Historical Provider, MD  atorvastatin (LIPITOR) 10 MG tablet Take 10 mg by mouth at bedtime.    Yes Historical Provider, MD  fish oil-omega-3 fatty acids 1000 MG capsule Take 1 g by mouth daily.   Yes Historical Provider, MD  hydrochlorothiazide (HYDRODIURIL) 25 MG tablet Take 25 mg by mouth every morning.    Yes Historical Provider, MD  Multiple Vitamins-Minerals (MULTIVITAMINS THER. W/MINERALS) TABS Take 1 tablet by mouth every morning.    Yes Historical Provider, MD  azithromycin (ZITHROMAX Z-PAK) 250 MG tablet 2 tablets on first day, then 1 tablet a day on days 2-5 Patient not taking: Reported on 10/02/2015 09/08/15   Fransico Meadow, PA-C  dexamethasone (DECADRON) 4 MG tablet Take 1 tablet (4 mg total) by mouth 2 (two) times daily with a meal. 10/02/15   Lily Kocher, PA-C  meloxicam (MOBIC) 7.5 MG tablet Take 1 tablet (7.5 mg total) by mouth 2 (two) times daily after a meal. 10/02/15   Lily Kocher, PA-C   BP 135/67 mmHg  Pulse 70  Temp(Src) 98.1 F (36.7 C) (Temporal)  Resp 16  Ht 5' 5.5" (1.664 m)  Wt 75.297 kg  BMI 27.19 kg/m2  SpO2 99% Physical Exam  Constitutional: She is oriented to person, place, and time. She appears well-developed and well-nourished.  Non-toxic appearance.  HENT:  Head: Normocephalic.  Right Ear: Tympanic membrane and external ear normal.  Left Ear: Tympanic membrane and external ear normal.  Eyes: EOM and lids are normal. Pupils are equal, round, and reactive to light.  Neck: Normal range of motion. Neck supple. Carotid bruit is not present.  Cardiovascular: Normal rate, regular rhythm, normal heart sounds, intact distal pulses and normal pulses.   Pulmonary/Chest: Breath sounds  normal. No respiratory distress.  Abdominal: Soft. Bowel sounds are normal. There is no tenderness. There is no guarding.  Musculoskeletal: Normal range of motion.       Cervical back: She exhibits tenderness and spasm. She exhibits normal range of motion and no deformity.  There is soreness of the mid to lower lateral right neck. There is full range of motion of the right shoulder. There is full range of motion of the right elbow wrist and fingers.  Lymphadenopathy:       Head (right side): No submandibular adenopathy present.       Head (left side): No submandibular adenopathy present.    She has no cervical adenopathy.  Neurological: She is alert and oriented to person, place, and time. She has normal strength. No cranial nerve deficit or sensory deficit.  Grip is symmetrical. No motor deficit noted. Pt is ambulatory without problem.  Skin: Skin is warm and dry.  Psychiatric: She has a normal mood and affect. Her speech is normal.  Nursing note and vitals reviewed.   ED Course  Procedures (including critical care time) Labs Review Labs Reviewed - No data to display  Imaging Review Ct Cervical Spine Wo Contrast  10/02/2015  CLINICAL DATA:  Study 66-year-old presenting with a 4 day history of right-sided neck pain with right upper extremity paresthesias. Patient reports that she performed a large amount of yard work at home 5 days ago which included cutting down trees. No injuries, however. EXAM: CT CERVICAL SPINE WITHOUT CONTRAST TECHNIQUE: Multidetector CT imaging of the cervical spine was performed without intravenous contrast. Multiplanar CT image reconstructions were also generated. COMPARISON:  None. FINDINGS: No fractures identified involving the cervical spine. Sagittal reconstructed images demonstrate very slight (2 mm) spondylolisthesis of C4 on C5, degenerative in nature as there are severe facet degenerative changes at this level. Facet joints intact throughout with diffuse  degenerative changes. Mild disc space narrowing at C4-5 and C6-7. Moderate disc space narrowing at C5-6 with vacuum phenomenon. No spinal stenosis. Soft tissue window images demonstrate no overt disc extrusions. Coronal reformatted images demonstrate an intact craniocervical junction, intact C1-C2 articulation, intact dens, and intact lateral masses throughout. Predominately facet hypertrophy accounts for multilevel foraminal stenoses including mild left C3-4, severe bilateral C5-6, severe bilateral C6-7, and mild bilateral C7-T1. IMPRESSION: 1. No acute osseous abnormality. 2. Degenerative changes throughout the cervical spine as detailed above with multilevel foraminal stenoses and no evidence of spinal stenosis. Electronically Signed   By: Evangeline Dakin M.D.   On: 10/02/2015 14:22   I have personally reviewed and evaluated these images and lab results as part of my medical decision-making.   EKG Interpretation None      MDM  Vital signs are stable. The CT C-spine is negative for acute bone issue. There are multiple levels of degenerative changes through out the  C-spine, especially noted is foraminal stenosis. Pt denies dropping objects or controlling the upper extremities. Plan - orthopedic consult with Dr Berenice Primas. Rx for decadron and Mobic 7.5 bid with meal given to the patient.   Final diagnoses:  Foraminal stenosis of cervical region  Paresthesia of right arm    *I have reviewed nursing notes, vital signs, and all appropriate lab and imaging results for this patient.Lily Kocher, PA-C 10/02/15 1513  Milton Ferguson, MD 10/02/15 [phone]

## 2015-10-30 ENCOUNTER — Ambulatory Visit (INDEPENDENT_AMBULATORY_CARE_PROVIDER_SITE_OTHER): Payer: Medicare FFS | Admitting: Neurology

## 2015-10-30 ENCOUNTER — Encounter: Payer: Self-pay | Admitting: Neurology

## 2015-10-30 VITALS — BP 139/84 | HR 72 | Ht 65.0 in | Wt 176.5 lb

## 2015-10-30 DIAGNOSIS — R29898 Other symptoms and signs involving the musculoskeletal system: Secondary | ICD-10-CM | POA: Insufficient documentation

## 2015-10-30 HISTORY — DX: Other symptoms and signs involving the musculoskeletal system: R29.898

## 2015-10-30 NOTE — Progress Notes (Signed)
Reason for visit: Right arm weakness  Referring physician: Dr. Gerlean Ren Nicole Good is a 75 y.o. female  History of present illness:  Nicole Good is a 75 year old right-handed black female with a history of onset of right arm weakness that began about one month ago. The patient indicates that she woke up one morning with some sensation of weakness and nervousness of the right arm and hand. The patient reports some slight tingling sensations in the fourth and fifth fingers of the hand. She went to [facility], MRI of the cervical spine was done showing evidence of cervical spondylosis. She was sent to Dr. Rip Harbour from orthopedic surgery for an evaluation. No definite orthopedic etiology of her symptoms were noted. The patient was found to have relatively normal strength in both arms. The patient denies any neck pain or pain down the arm. She reports no troubles with balance or difficulty with any sensation alteration of the face, speech changes, or any loss of vision. She is sent to this office for an evaluation.  Past Medical History  Diagnosis Date  . High cholesterol   . Hypertension   . Pulmonary emboli [facility])     Past Surgical History  Procedure Laterality Date  . Dilitation and curretage    . Abdominal hysterectomy      Family History  Problem Relation Age of Onset  . Stroke Mother   . Heart disease Mother   . Hypertension Maternal Grandmother     Social history:  reports that she has never smoked. She has never used smokeless tobacco. She reports that she does not drink alcohol or use illicit drugs.  Medications:  Prior to Admission medications   Medication Sig Start Date End Date Taking? Authorizing Provider  aspirin EC 81 MG tablet Take 81 mg by mouth every morning.    Yes Historical Provider, MD  atorvastatin (LIPITOR) 10 MG tablet Take 10 mg by mouth at bedtime.    Yes Historical Provider, MD  azithromycin (ZITHROMAX Z-PAK) 250 MG tablet 2 tablets on  first day, then 1 tablet a day on days 2-5 09/08/15  Yes Fransico Meadow, PA-C  dexamethasone (DECADRON) 4 MG tablet Take 1 tablet (4 mg total) by mouth 2 (two) times daily with a meal. 10/02/15  Yes Lily Kocher, PA-C  fish oil-omega-3 fatty acids 1000 MG capsule Take 1 g by mouth daily.   Yes Historical Provider, MD  hydrochlorothiazide (HYDRODIURIL) 25 MG tablet Take 25 mg by mouth every morning.    Yes Historical Provider, MD  meloxicam (MOBIC) 7.5 MG tablet Take 1 tablet (7.5 mg total) by mouth 2 (two) times daily after a meal. 10/02/15  Yes Lily Kocher, PA-C  Multiple Vitamins-Minerals (MULTIVITAMINS THER. W/MINERALS) TABS Take 1 tablet by mouth every morning.    Yes Historical Provider, MD     No Known Allergies  ROS:  Out of a complete 14 system review of symptoms, the patient complains only of the following symptoms, and all other reviewed systems are negative.  Fatigue  Blood pressure 139/84, pulse 72, height 5\' 5"  (1.651 m), weight 176 lb 8 oz (80.06 kg).  Physical Exam  General: The patient is alert and cooperative at the time of the examination.  Eyes: Pupils are equal, round, and reactive to light. Discs are flat bilaterally.  Neck: The neck is supple, no carotid bruits are noted.  Respiratory: The respiratory examination is clear.  Cardiovascular: The cardiovascular examination reveals a regular rate and rhythm,  no obvious murmurs or rubs are noted.  Skin: Extremities are without significant edema.  Neurologic Exam  Mental status: The patient is alert and oriented x 3 at the time of the examination. The patient has apparent normal recent and remote memory, with an apparently normal attention span and concentration ability.  Cranial nerves: Facial symmetry is present. There is good sensation of the face to pinprick and soft touch bilaterally. The strength of the facial muscles and the muscles to head turning and shoulder shrug are normal bilaterally. Speech is well  enunciated, no aphasia or dysarthria is noted. Extraocular movements are full. Visual fields are full. The tongue is midline, and the patient has symmetric elevation of the soft palate. No obvious hearing deficits are noted.  Motor: The motor testing reveals 5 over 5 strength of all 4 extremities. Good symmetric motor tone is noted throughout.  Sensory: Sensory testing is intact to pinprick, soft touch, vibration sensation, and position sense on all 4 extremities. No evidence of extinction is noted.  Coordination: Cerebellar testing reveals good finger-nose-finger and heel-to-shin bilaterally.  Gait and station: Gait is normal. Tandem gait is normal. Romberg is negative. No drift is seen.  Reflexes: Deep tendon reflexes are symmetric and normal bilaterally, with exception of a slight increase in the left biceps reflex relative to the right. Toes are downgoing bilaterally.   Assessment/Plan:  1. Subjective right arm weakness  Clinical examination today is relatively unremarkable. The only notable features are that the biceps reflex on the right is decreased as compared to the left. I see no weakness whatsoever. The patient reports sudden onset of painless sensation of weakness in the right arm. We will check MRI of the brain, if this shows evidence of a recent stroke event, we will pursue further stroke evaluation.  Jill Alexanders MD 10/30/2015 7:33 PM  [facility] [address] [place], [place] [address]  Phone [phone] Fax [phone]

## 2015-11-01 ENCOUNTER — Encounter (HOSPITAL_COMMUNITY): Payer: Self-pay | Admitting: Occupational Therapy

## 2015-11-01 ENCOUNTER — Ambulatory Visit (HOSPITAL_COMMUNITY): Payer: Medicare PPO | Attending: Family Medicine | Admitting: Occupational Therapy

## 2015-11-01 DIAGNOSIS — M6281 Muscle weakness (generalized): Secondary | ICD-10-CM | POA: Insufficient documentation

## 2015-11-01 NOTE — Therapy (Addendum)
[place], [place], [address] Phone: [phone]   Fax:  [phone]  Occupational Therapy Evaluation  Patient Details  Name: Nicole Good MRN: [id number] Date of Birth: 1941-03-12 Referring Provider: Dr. Rhina Brackett  Encounter Date: 11/01/2015      OT End of Session - 11/01/15 1159    Visit Number 1   Number of Visits 1   Date for OT Re-Evaluation 11/02/15   Authorization Type Generic Commercial   OT Start Time 1007   OT Stop Time 1049   OT Time Calculation (min) 42 min   Activity Tolerance Patient tolerated treatment well   Behavior During Therapy [facility] for tasks assessed/performed      Past Medical History  Diagnosis Date  . High cholesterol   . Hypertension   . Pulmonary emboli [facility])     Past Surgical History  Procedure Laterality Date  . Dilitation and curretage    . Abdominal hysterectomy      There were no vitals filed for this visit.  Visit Diagnosis:  Muscle weakness of right upper extremity      Subjective Assessment - 11/01/15 1155    Subjective  S: My right arm just feels different than my left.    Pertinent History Pt is a 75 y/o female with complaints of right arm weakness that began approximately 1 month ago after she was moving limbs in the yard. Pt does not have any pain, tingling, or numbness in her RUE. Dr. Simonne Maffucci referred pt to occupational therapy for evaluation and treatment.    Special Tests FOTO Score: 84/100 (16% impairment)   Patient Stated Goals To have my arm feel normal   Currently in Pain? No/denies           [facility] OT Assessment - 11/01/15 1006    Assessment   Diagnosis Right arm weakness   Referring Provider Dr. Rhina Brackett   Onset Date 10/01/15  sudden onset   Prior Therapy none   Precautions   Precautions None   Balance Screen   Has the patient fallen in the past 6 months No   Has the patient had a decrease in activity level  because of a fear of falling?  No   Home  Environment   Family/patient expects to be discharged to: Private residence   Prior Function   Level of Independence Independent with basic ADLs   Leisure yardwork, reading, piano, community work   ADL   ADL comments Pt is able to complete all ADL and leisure tasks however her arm feels weak and "different from the left" the majority of the time   Written Expression   Dominant Hand Right   Cognition   Overall Cognitive Status Within Functional Limits for tasks assessed   ROM / Strength   AROM / PROM / Strength Strength;AROM   Palpation   Palpation comment Min fascial restrictions in the trapezius region   AROM   Overall AROM Comments All A/ROM is [facility]   Strength   Overall Strength Comments Assessed seated, ER/IR adducted   Strength Assessment Site Shoulder;Elbow;Wrist;Hand   Right/Left Shoulder Right   Right Shoulder Flexion 5/5   Right Shoulder ABduction 4+/5   Right Shoulder Internal Rotation 4+/5   Right Shoulder External Rotation 4/5   Right/Left Elbow Right   Right Elbow Flexion 4+/5   Right Elbow Extension 4+/5   Right/Left Wrist Right   Right Wrist Flexion 5/5   Right Wrist Extension  5/5   Right Wrist Radial Deviation 5/5   Right Wrist Ulnar Deviation 5/5   Right/Left hand Right;Left   Right Hand Gross Grasp Functional   Right Hand Grip (lbs) 54   Right Hand Lateral Pinch 17 lbs   Right Hand 3 Point Pinch 10 lbs   Left Hand Gross Grasp Functional   Left Hand Grip (lbs) 48   Left Hand Lateral Pinch 10 lbs   Left Hand 3 Point Pinch 9 lbs                  OT Treatments/Exercises (OP) - 11/01/15 1207    Exercises   Exercises Shoulder   Shoulder Exercises: Seated   Protraction Strengthening;5 reps   Protraction Weight (lbs) 2   Horizontal ABduction Strengthening;5 reps   Horizontal ABduction Weight (lbs) 2   External Rotation Strengthening;5 reps   External Rotation Weight (lbs) 2   Internal Rotation  Strengthening;5 reps   Internal Rotation Weight (lbs) 2   Flexion Strengthening;5 reps   Flexion Weight (lbs) 2   Abduction Strengthening;5 reps   ABduction Weight (lbs) 2   Shoulder Exercises: Standing   Extension Theraband;5 reps   Theraband Level (Shoulder Extension) Level 3 (Green)   Row [employer] reps   Theraband Level (Shoulder Row) Level 3 (Green)   Retraction Theraband;5 reps   Theraband Level (Shoulder Retraction) Level 3 (Green)               OT Education - 11/01/15 1158    Education provided Yes   Education Details RUE strengthening exercises, scapular theraband strengthening exercises   Person(s) Educated Patient   Methods Explanation;Demonstration;Handout   Comprehension Verbalized understanding;Returned demonstration          OT Short Term Goals - 11/01/15 1206    OT SHORT TERM GOAL #1   Title Pt will be educated on and independent in HEP.    Time 1   Period Days   Status Achieved                  Plan - 11/01/15 1159    Clinical Impression Statement A: Pt is a 75 y/o female presenting with weakness in the RUE after moving limbs in her yard approximately 1 month ago. Pt reports she has no difficulty using the RUE including lifting heavy objects and working out at the gym. Pt has no pain or altered sensation in the RUE. Pt presents with A/ROM and strength WFL, describing the feeling as " that feeling you get when you're nervous about something." Discussed and HEP versus therapy sessions with pt, pt is agreeable to HEP.     Pt will benefit from skilled therapeutic intervention in order to improve on the following deficits (Retired) Decreased strength   Rehab Potential Good   OT Frequency 1x / week   OT Duration --  1 visit   OT Treatment/Interventions Patient/family education   Plan P: Pt will benefit from skilled OT services and education on general RUE strengthening, self-myofascial release to decrease fascial restrictions and increase RUE  strength, focusing on improved activity tolerance and decreased fatigue during daily tasks.    OT Home Exercise Plan RUE strengthening and scapular theraband exercises   Consulted and Agree with Plan of Care Patient     G-Codes: Carrying, Moving, or Handling Objects Goal Status: [place] 0 percent impaired, limited, or restricted Discharge Status: [place] 0 percent impaired, limited, or restricted   Problem List Patient Active Problem List   Diagnosis  Date Noted  . Right arm weakness 10/30/2015    Guadelupe Sabin, OTR/L  [phone]  11/01/2015, 12:08 PM  [facility] [address] [place], [place], [address] Phone: [phone]   Fax:  [phone]  Name: Nicole Good MRN: [id number] Date of Birth: 1941-02-05

## 2015-11-01 NOTE — Patient Instructions (Signed)
Complete exercises with a 2 lb weight to begin with & increase as able to tolerate.   1) Shoulder Protraction    Begin with elbows by your side, slowly "punch" straight out in front of you keeping arms/elbows straight.      2) Shoulder Flexion  Supine:     Standing:         Begin with arms at your side with thumbs pointed up, slowly raise both arms up and forward towards overhead.               3) Horizontal abduction/adduction  Supine:   Standing:           Begin with arms straight out in front of you, bring out to the side in at "T" shape. Keep arms straight entire time.                 4) Internal & External Rotation    *No band* -Stand with elbows at the side and elbows bent 90 degrees. Move your forearms away from your body, then bring back inward toward the body.     5) Shoulder Abduction  Supine:     Standing:       Lying on your back begin with your arms flat on the table next to your side. Slowly move your arms out to the side so that they go overhead, in a jumping jack or snow angel movement.    6) X to V arms (cheerleader move):  Begin with arms straight down, crossed in front of body in an "X". Keeping arms crossed, lift arms straight up overhead. Then spread arms apart into a "V" shape.  Bring back together into x and lower down to starting position.    Repeat all exercises 10-15 times, 1-2 times per day.        Theraband Exercises: Complete 1-2X per day  (Home) Extension: Isometric / Bilateral Arm Retraction - Sitting   Facing anchor, hold hands and elbow at shoulder height, with elbow bent.  Pull arms back to squeeze shoulder blades together. Repeat 10-15 times.  Copyright  VHI. All rights reserved.   (Home) Retraction: Row - Bilateral (Anchor)   Facing anchor, arms reaching forward, pull hands toward stomach, keeping elbows bent and at your sides and pinching shoulder blades together. Repeat 10-15  times.  Copyright  VHI. All rights reserved.   (Clinic) Extension / Flexion (Assist)   Face anchor, pull arms back, keeping elbow straight, and squeze shoulder blades together. Repeat 10-15 times.   Copyright  VHI. All rights reserved.

## 2015-11-10 ENCOUNTER — Ambulatory Visit
Admission: RE | Admit: 2015-11-10 | Discharge: 2015-11-10 | Disposition: A | Discharge status: Home / Self Care | Payer: Medicare FFS | Source: Ambulatory Visit | Attending: Neurology | Admitting: Neurology

## 2015-11-10 DIAGNOSIS — R29898 Other symptoms and signs involving the musculoskeletal system: Secondary | ICD-10-CM

## 2015-11-12 ENCOUNTER — Telehealth: Payer: Self-pay | Admitting: Neurology

## 2015-11-12 NOTE — Telephone Encounter (Signed)
  I called the patient. The MRI shows minimal SVD, nothing to explain the symptoms noted by the patient.   MRI brain 11/12/15:  IMPRESSION: This MRI of the brain without contrast shows the following: 1. 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 The extent is typical for age. 2. There are no acute findings

## 2015-11-26 NOTE — Telephone Encounter (Signed)
I spoke to pt and relayed  her MRI results as per below.   She verbalized understanding.

## 2015-11-26 NOTE — Telephone Encounter (Addendum)
Pt called and would like to get results of MRI. May leave a message [phone]

## 2015-12-03 ENCOUNTER — Inpatient Hospital Stay (HOSPITAL_COMMUNITY): Admit: 2015-12-03 | Payer: Medicare FFS

## 2015-12-03 ENCOUNTER — Observation Stay (HOSPITAL_COMMUNITY): Payer: Medicare FFS

## 2015-12-03 ENCOUNTER — Emergency Department (HOSPITAL_BASED_OUTPATIENT_CLINIC_OR_DEPARTMENT_OTHER)
Admit: 2015-12-03 | Discharge: 2015-12-03 | Disposition: A | Discharge status: Home / Self Care | Payer: Medicare FFS | Attending: Vascular Surgery | Admitting: Vascular Surgery

## 2015-12-03 ENCOUNTER — Observation Stay (HOSPITAL_COMMUNITY)
Admission: EM | Admit: 2015-12-03 | Discharge: 2015-12-04 | Disposition: A | Discharge status: Home / Self Care | Payer: Medicare FFS | Attending: Family Medicine | Admitting: Family Medicine

## 2015-12-03 ENCOUNTER — Encounter (HOSPITAL_COMMUNITY): Payer: Self-pay | Admitting: Emergency Medicine

## 2015-12-03 DIAGNOSIS — I82402 Acute embolism and thrombosis of unspecified deep veins of left lower extremity: Secondary | ICD-10-CM | POA: Diagnosis present

## 2015-12-03 DIAGNOSIS — R55 Syncope and collapse: Secondary | ICD-10-CM | POA: Insufficient documentation

## 2015-12-03 DIAGNOSIS — I824Z2 Acute embolism and thrombosis of unspecified deep veins of left distal lower extremity: Secondary | ICD-10-CM | POA: Diagnosis not present

## 2015-12-03 DIAGNOSIS — Z86711 Personal history of pulmonary embolism: Secondary | ICD-10-CM

## 2015-12-03 DIAGNOSIS — M7989 Other specified soft tissue disorders: Secondary | ICD-10-CM

## 2015-12-03 DIAGNOSIS — I82412 Acute embolism and thrombosis of left femoral vein: Secondary | ICD-10-CM | POA: Diagnosis not present

## 2015-12-03 DIAGNOSIS — E785 Hyperlipidemia, unspecified: Secondary | ICD-10-CM | POA: Diagnosis not present

## 2015-12-03 DIAGNOSIS — I82409 Acute embolism and thrombosis of unspecified deep veins of unspecified lower extremity: Secondary | ICD-10-CM

## 2015-12-03 DIAGNOSIS — R7989 Other specified abnormal findings of blood chemistry: Secondary | ICD-10-CM | POA: Diagnosis not present

## 2015-12-03 DIAGNOSIS — I82442 Acute embolism and thrombosis of left tibial vein: Secondary | ICD-10-CM | POA: Diagnosis not present

## 2015-12-03 DIAGNOSIS — I10 Essential (primary) hypertension: Secondary | ICD-10-CM | POA: Diagnosis present

## 2015-12-03 DIAGNOSIS — I82432 Acute embolism and thrombosis of left popliteal vein: Secondary | ICD-10-CM | POA: Insufficient documentation

## 2015-12-03 DIAGNOSIS — R609 Edema, unspecified: Secondary | ICD-10-CM

## 2015-12-03 DIAGNOSIS — E7800 Pure hypercholesterolemia, unspecified: Secondary | ICD-10-CM | POA: Insufficient documentation

## 2015-12-03 DIAGNOSIS — I2699 Other pulmonary embolism without acute cor pulmonale: Secondary | ICD-10-CM | POA: Diagnosis not present

## 2015-12-03 DIAGNOSIS — I129 Hypertensive chronic kidney disease with stage 1 through stage 4 chronic kidney disease, or unspecified chronic kidney disease: Secondary | ICD-10-CM | POA: Insufficient documentation

## 2015-12-03 DIAGNOSIS — N183 Chronic kidney disease, stage 3 (moderate): Secondary | ICD-10-CM | POA: Insufficient documentation

## 2015-12-03 DIAGNOSIS — I999 Unspecified disorder of circulatory system: Secondary | ICD-10-CM | POA: Diagnosis not present

## 2015-12-03 DIAGNOSIS — K449 Diaphragmatic hernia without obstruction or gangrene: Secondary | ICD-10-CM | POA: Diagnosis not present

## 2015-12-03 DIAGNOSIS — Z7982 Long term (current) use of aspirin: Secondary | ICD-10-CM | POA: Insufficient documentation

## 2015-12-03 DIAGNOSIS — Z86718 Personal history of other venous thrombosis and embolism: Secondary | ICD-10-CM | POA: Insufficient documentation

## 2015-12-03 HISTORY — DX: Cardiac murmur, unspecified: R01.1

## 2015-12-03 HISTORY — DX: Acute embolism and thrombosis of unspecified deep veins of unspecified lower extremity: I82.409

## 2015-12-03 HISTORY — DX: Unspecified osteoarthritis, unspecified site: M19.90

## 2015-12-03 LAB — COMPREHENSIVE METABOLIC PANEL
ALBUMIN: 4.4 g/dL (ref 3.5–5.0)
ALT: 20 U/L (ref 14–54)
ANION GAP: 10 (ref 5–15)
AST: 25 U/L (ref 15–41)
Alkaline Phosphatase: 70 U/L (ref 38–126)
BILIRUBIN TOTAL: 0.8 mg/dL (ref 0.3–1.2)
BUN: 33 mg/dL — ABNORMAL HIGH (ref 6–20)
CHLORIDE: 105 mmol/L (ref 101–111)
CO2: 25 mmol/L (ref 22–32)
Calcium: 9.4 mg/dL (ref 8.9–10.3)
Creatinine, Ser: 1.47 mg/dL — ABNORMAL HIGH (ref 0.44–1.00)
GFR calc Af Amer: 39 mL/min — ABNORMAL LOW (ref >60)
GFR calc non Af Amer: 34 mL/min — ABNORMAL LOW (ref >60)
GLUCOSE: 162 mg/dL — AB (ref 65–99)
POTASSIUM: 4 mmol/L (ref 3.5–5.1)
Sodium: 140 mmol/L (ref 135–145)
TOTAL PROTEIN: 8.1 g/dL (ref 6.5–8.1)

## 2015-12-03 LAB — I-STAT CHEM 8, ED
BUN: 36 mg/dL — AB (ref 6–20)
Calcium, Ion: 1.18 mmol/L (ref 1.13–1.30)
Chloride: 104 mmol/L (ref 101–111)
Creatinine, Ser: 1.5 mg/dL — ABNORMAL HIGH (ref 0.44–1.00)
Glucose, Bld: 153 mg/dL — ABNORMAL HIGH (ref 65–99)
HEMATOCRIT: 44 % (ref 36.0–46.0)
HEMOGLOBIN: 15 g/dL (ref 12.0–15.0)
POTASSIUM: 4.1 mmol/L (ref 3.5–5.1)
SODIUM: 143 mmol/L (ref 135–145)
TCO2: 27 mmol/L (ref 0–100)

## 2015-12-03 LAB — PROTIME-INR
INR: 1.11 (ref 0.00–1.49)
PROTHROMBIN TIME: 14.5 s (ref 11.6–15.2)

## 2015-12-03 LAB — CBC WITH DIFFERENTIAL/PLATELET
BASOS ABS: 0 10*3/uL (ref 0.0–0.1)
BASOS PCT: 0 %
EOS ABS: 0.2 10*3/uL (ref 0.0–0.7)
EOS PCT: 2 %
HEMATOCRIT: 40.4 % (ref 36.0–46.0)
Hemoglobin: 13.2 g/dL (ref 12.0–15.0)
Lymphocytes Relative: 18 %
Lymphs Abs: 1.5 10*3/uL (ref 0.7–4.0)
MCH: 30.2 pg (ref 26.0–34.0)
MCHC: 32.7 g/dL (ref 30.0–36.0)
MCV: 92.4 fL (ref 78.0–100.0)
MONO ABS: 0.3 10*3/uL (ref 0.1–1.0)
MONOS PCT: 3 %
NEUTROS ABS: 6.7 10*3/uL (ref 1.7–7.7)
NEUTROS PCT: 77 %
PLATELETS: 185 10*3/uL (ref 150–400)
RBC: 4.37 MIL/uL (ref 3.87–5.11)
RDW: 13.4 % (ref 11.5–15.5)
WBC: 8.7 10*3/uL (ref 4.0–10.5)

## 2015-12-03 MED ORDER — ACETAMINOPHEN 650 MG RE SUPP: 650.0000 mg | Freq: Four times a day (QID) | RECTAL | Status: DC | PRN

## 2015-12-03 MED ORDER — ASPIRIN EC 81 MG PO TBEC
81.0000 mg | DELAYED_RELEASE_TABLET | Freq: Every morning | ORAL | Status: DC
  Administered 2015-12-04: 81 mg via ORAL
  Filled 2015-12-03: qty 1

## 2015-12-03 MED ORDER — ADULT MULTIVITAMIN W/MINERALS CH
1.0000 | ORAL_TABLET | Freq: Every day | ORAL | Status: DC
  Administered 2015-12-04: 1 via ORAL
  Filled 2015-12-03: qty 1

## 2015-12-03 MED ORDER — ACETAMINOPHEN 325 MG PO TABS: 650.0000 mg | ORAL_TABLET | Freq: Four times a day (QID) | ORAL | Status: DC | PRN

## 2015-12-03 MED ORDER — SODIUM CHLORIDE 0.9 % IV SOLN
INTRAVENOUS | Status: DC
  Administered 2015-12-03 – 2015-12-04 (×2): via INTRAVENOUS

## 2015-12-03 MED ORDER — ONDANSETRON HCL 4 MG PO TABS: 4.0000 mg | ORAL_TABLET | Freq: Four times a day (QID) | ORAL | Status: DC | PRN

## 2015-12-03 MED ORDER — HYDROCODONE-ACETAMINOPHEN 5-325 MG PO TABS: 1.0000 | ORAL_TABLET | ORAL | Status: DC | PRN

## 2015-12-03 MED ORDER — OMEGA-3 FATTY ACIDS 1000 MG PO CAPS: 1.0000 g | ORAL_CAPSULE | Freq: Every day | ORAL | Status: DC

## 2015-12-03 MED ORDER — ONDANSETRON HCL 4 MG/2ML IJ SOLN: 4.0000 mg | Freq: Four times a day (QID) | INTRAMUSCULAR | Status: DC | PRN

## 2015-12-03 MED ORDER — THERA M PLUS PO TABS: 1.0000 | ORAL_TABLET | Freq: Every morning | ORAL | Status: DC

## 2015-12-03 MED ORDER — FENTANYL CITRATE (PF) 100 MCG/2ML IJ SOLN
50.0000 ug | Freq: Once | INTRAMUSCULAR | Status: AC
  Administered 2015-12-03: 50 ug via INTRAVENOUS
  Filled 2015-12-03: qty 2

## 2015-12-03 MED ORDER — FENTANYL CITRATE (PF) 100 MCG/2ML IJ SOLN
25.0000 ug | Freq: Once | INTRAMUSCULAR | Status: AC
  Administered 2015-12-03: 25 ug via INTRAVENOUS
  Filled 2015-12-03: qty 2

## 2015-12-03 MED ORDER — ATORVASTATIN CALCIUM 10 MG PO TABS
10.0000 mg | ORAL_TABLET | Freq: Every day | ORAL | Status: DC
  Administered 2015-12-03: 10 mg via ORAL
  Filled 2015-12-03: qty 1

## 2015-12-03 MED ORDER — IOHEXOL 350 MG/ML SOLN
100.0000 mL | Freq: Once | INTRAVENOUS | Status: AC | PRN
  Administered 2015-12-03: 70 mL via INTRAVENOUS

## 2015-12-03 MED ORDER — SODIUM CHLORIDE 0.9% FLUSH
3.0000 mL | Freq: Two times a day (BID) | INTRAVENOUS | Status: DC
  Administered 2015-12-03 – 2015-12-04 (×2): 3 mL via INTRAVENOUS

## 2015-12-03 MED ORDER — ENOXAPARIN SODIUM 80 MG/0.8ML ~~LOC~~ SOLN
1.0000 mg/kg | Freq: Two times a day (BID) | SUBCUTANEOUS | Status: DC
  Administered 2015-12-03 – 2015-12-04 (×2): 80 mg via SUBCUTANEOUS
  Filled 2015-12-03 (×3): qty 0.8

## 2015-12-03 MED ORDER — OMEGA-3-ACID ETHYL ESTERS 1 G PO CAPS
1.0000 g | ORAL_CAPSULE | Freq: Every day | ORAL | Status: DC
  Administered 2015-12-04: 1 g via ORAL
  Filled 2015-12-03: qty 1

## 2015-12-03 NOTE — Progress Notes (Signed)
ANTICOAGULATION CONSULT NOTE - Initial Consult  Pharmacy Consult for LMWH Indication: DVT  No Known Allergies  Patient Measurements: Height: 5' 5.5" (166.4 cm) Weight: 175 lb (79.379 kg) IBW/kg (Calculated) : 58.15   Vital Signs: Temp: 98.6 F (37 C) (03/27 1151) Temp Source: Oral (03/27 1151) BP: 108/65 mmHg (03/27 1737) Pulse Rate: 62 (03/27 1737)  Labs:  Recent Labs  12/03/15 1344 12/03/15 1347  HGB 13.2 15.0  HCT 40.4 44.0  PLT 185  --   LABPROT 14.5  --   INR 1.11  --   CREATININE 1.47* 1.50*    Estimated Creatinine Clearance: 34.1 mL/min (by C-G formula based on Cr of 1.5).   Medical History: Past Medical History  Diagnosis Date  . High cholesterol   . Hypertension   . 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  Pharmacy consulted to dose LMWH.   Wt 79.4 kg, creat 1.5, creat cl ~ 36 ml/min.  CBC WNL.  Baseline INR 1.11.  She has previous hx of PE approx 4 years ago but not on chronic anticoagulation.    Goal of Therapy:  Anti-Xa level 0.6-1 units/ml 4hrs after LMWH dose given Monitor platelets by anticoagulation protocol: Yes   Plan:  LMWH 1 mg/kg sq q12 = 80 q12h Could transition to 120 mg sq q24 hrs for convenience if needed at discharge F/u renal function and CBC F/u plans for oral anticoagulation  Eudelia Bunch, Pharm.D. [id number] 12/03/2015 5:46 PM

## 2015-12-03 NOTE — Progress Notes (Signed)
VASCULAR LAB PRELIMINARY  VENOUS  Right:  No evidence of DVT, superficial thrombosis, or Baker's cyst.  Left: DVT noted from the CFV, FV, pop v, gastrocnemius veins, PTV, and peroneal v.  Superficial thrombosis noted in the proximal few inches of the GSV.  No Baker's cyst.    ARTERIAL  ABI completed:  Biphasic waveforms from the CFA through the popliteal artery.  No areas of stenosis noted.      RIGHT    LEFT    PRESSURE WAVEFORM  PRESSURE WAVEFORM  BRACHIAL 129 Triphasic  BRACHIAL 124 Triphasic   DP 137 Triphasic  DP 112 Biphasic   AT   AT    PT 135 Triphasic  PT 127 Biphasic   PER   PER    GREAT TOE  NA GREAT TOE  NA    RIGHT LEFT  ABI 1.06 0.98     Nicole Good, RVT 12/03/2015, 4:53 PM

## 2015-12-03 NOTE — Consult Note (Addendum)
Referred by:  Dr. Wyvonnia Dusky [facility] ED)  Reason for referral: left leg ischemia  History of Present Illness  Nicole Good is a 76 y.o. ([birth year]) female who presents with chief complaint: fatigue for 2 weeks.  While waiting in the waiting room, she developed L calf swelling ~2.5 hours ago.  She then had a near syncopal event reported during blood draw with HR into 160s.  After resolution of this event, reported the ED physican could not feel pulse in the patient Left foot.  ED wanted her transfer for evaluation of left foot ischemia.  The patient denies any motor loss and anesthesia.  This patient does not have baseline history of intermittent claudication or rest pain.  Her atherosclerotic risk factors include: HLD, HTN.  She has had a PE previously but is not on chronic anticoagulation.   Past Medical History  Diagnosis Date  . High cholesterol   . Hypertension   . Pulmonary emboli [facility])     Past Surgical History  Procedure Laterality Date  . Dilitation and curretage    . Abdominal hysterectomy      Social History   Social History  . Marital Status: Married    Spouse Name: N/A  . Number of Children: 1  . Years of Education: MA   Occupational History  . retired    Social History Main Topics  . Smoking status: Never Smoker   . Smokeless tobacco: Never Used  . Alcohol Use: No  . Drug Use: No  . Sexual Activity: Not on file   Other Topics Concern  . Not on file   Social History Narrative   Patient does not drink much caffeine.   Patient is right handed.    Family History  Problem Relation Age of Onset  . Stroke Mother   . Heart disease Mother   . Hypertension Maternal Grandmother     No current facility-administered medications for this encounter.   Current Outpatient Prescriptions  Medication Sig Dispense Refill  . aspirin EC 81 MG tablet Take 81 mg by mouth every morning.     Marland Kitchen atorvastatin (LIPITOR) 10 MG tablet Take 10 mg by mouth at bedtime.       . fish oil-omega-3 fatty acids 1000 MG capsule Take 1 g by mouth daily.    . hydrochlorothiazide (HYDRODIURIL) 25 MG tablet Take 25 mg by mouth every morning.     . Multiple Vitamins-Minerals (MULTIVITAMINS THER. W/MINERALS) TABS Take 1 tablet by mouth every morning.     Marland Kitchen azithromycin (ZITHROMAX Z-PAK) 250 MG tablet 2 tablets on first day, then 1 tablet a day on days 2-5 (Patient not taking: Reported on 12/03/2015) 6 each 0  . dexamethasone (DECADRON) 4 MG tablet Take 1 tablet (4 mg total) by mouth 2 (two) times daily with a meal. (Patient not taking: Reported on 12/03/2015) 12 tablet 0  . meloxicam (MOBIC) 7.5 MG tablet Take 1 tablet (7.5 mg total) by mouth 2 (two) times daily after a meal. (Patient not taking: Reported on 12/03/2015) 12 tablet 0     No Known Allergies   REVIEW OF SYSTEMS:  (Positives checked otherwise negative)  CARDIOVASCULAR:   [ ]  chest pain,  [ ]  chest pressure,  [ ]  palpitations,  [ ]  shortness of breath when laying flat,  [ ]  shortness of breath with exertion,   [ ]  pain in feet when walking,  [ ]  pain in feet when laying flat, [x ] history of blood clot in  veins (DVT),  [ ]  history of phlebitis,  [x]  swelling in legs,  [ ]  varicose veins  PULMONARY:   [ ]  productive cough,  [ ]  asthma,  [ ]  wheezing  NEUROLOGIC:   [ ]  weakness in arms or legs,  [ ]  numbness in arms or legs,  [ ]  difficulty speaking or slurred speech,  [ ]  temporary loss of vision in one eye,  [ ]  dizziness  HEMATOLOGIC:   [ ]  bleeding problems,  [x]  problems with blood clotting too easily  MUSCULOSKEL:   [ ]  joint pain, [ ]  joint swelling  GASTROINTEST:   [ ]  vomiting blood,  [ ]  blood in stool     GENITOURINARY:   [ ]  burning with urination,  [ ]  blood in urine  PSYCHIATRIC:   [ ]  history of major depression  INTEGUMENTARY:   [ ]  rashes,  [ ]  ulcers  CONSTITUTIONAL:   [ ]  fever,  [ ]  chills    For VQI Use Only  PRE-ADM LIVING: Home  AMB STATUS:  Ambulatory  CAD Sx: None  PRIOR CHF: None  STRESS TEST: [x ] No, [ ]  Normal, [ ]  + ischemia, [ ]  + MI, [ ]  Both   Physical Examination  Filed Vitals:   12/03/15 1355 12/03/15 1400 12/03/15 1416 12/03/15 1458  BP: 118/70 111/82 111/82 114/78  Pulse: 80  71 64  Temp:      TempSrc:      Resp: 23 19 15 19   Height:      Weight:      SpO2: 99%  98% 98%   Body mass index is 28.67 kg/(m^2).  General: A&O x 3, WDWN  Head: [place]/AT  Ear/Nose/Throat: Hearing grossly intact, nares w/o erythema or drainage, oropharynx w/o Erythema/Exudate, Mallampati score: 3  Eyes: PERRLA, EOMI  Neck: Supple, no nuchal rigidity, no palpable LAD  Pulmonary: Sym exp, good air movt, CTAB, no rales, rhonchi, & wheezing  Cardiac: RRR, Nl S1, S2, no Murmurs, rubs or gallops  Vascular: Vessel Right Left  Radial Palpable Palpable  Brachial Palpable Palpable  Carotid Palpable, without bruit Palpable, without bruit  Aorta Not palpable N/A  Femoral Faintly Palpable Faintly Palpable  Popliteal Not palpable Not palpable  PT Not Palpable Not Palpable  DP Not Palpable Not Palpable   Gastrointestinal: soft, NTND, no G/R, no HSM, no masses, no CVAT B  Musculoskeletal: M/S 5/5 throughout included motor throughout left leg, Extremities without ischemic changes , L calf edema 1+, left anterior compartment swelling, erythema from proximal calf to distal thigh  Neurologic: CN 2-12 intact , Pain and light touch intact in extremities , Motor exam as listed above  Psychiatric: Judgment intact, Mood & affect appropriate for pt's clinical situation  Dermatologic: See M/S exam for extremity exam, no rashes otherwise noted  Lymph : No Cervical, Axillary, or Inguinal lymphadenopathy    Non-Invasive Vascular Imaging  ABI, LLE Venous and arterial duplex ordered  Laboratory: CBC:    Component Value Date/Time   WBC 8.7 12/03/2015 1344   RBC 4.37 12/03/2015 1344   HGB 15.0 12/03/2015 1347   HCT 44.0 12/03/2015  1347   PLT 185 12/03/2015 1344   MCV 92.4 12/03/2015 1344   MCH 30.2 12/03/2015 1344   MCHC 32.7 12/03/2015 1344   RDW 13.4 12/03/2015 1344   LYMPHSABS 1.5 12/03/2015 1344   MONOABS 0.3 12/03/2015 1344   EOSABS 0.2 12/03/2015 1344   BASOSABS 0.0 12/03/2015 1344    BMP:  Component Value Date/Time   NA 143 12/03/2015 1347   K 4.1 12/03/2015 1347   CL 104 12/03/2015 1347   CO2 25 12/03/2015 1344   GLUCOSE 153* 12/03/2015 1347   BUN 36* 12/03/2015 1347   CREATININE 1.50* 12/03/2015 1347   CALCIUM 9.4 12/03/2015 1344   GFRNONAA 34* 12/03/2015 1344   GFRAA 39* 12/03/2015 1344    Coagulation: Lab Results  Component Value Date   INR 1.11 12/03/2015   No results found for: PTT  Lipids: No results found for: CHOL, TRIG, HDL, CHOLHDL, VLDL, LDLCALC, LDLDIRECT   Medical Decision Making  Nicole Good is a 75 y.o. female who presents with: likely LLE DVT, chronic kidney disease stage III, abnormal glucose   Odds of combine DVT and thromboembolism are extremely low without PFO.  Non-invasive studies ordered.  Further recommendation depending on testing.  Thank you for allowing [place] to participate in this patient's care.   Adele Barthel, MD [facility] [place] Office: [phone] Pager: [phone]  12/03/2015, 3:42 PM  Addendum  VENOUS  Right: No evidence of DVT, superficial thrombosis, or Baker's cyst. Left: DVT noted from the CFV, FV, pop v, gastrocnemius veins, PTV, and peroneal v. Superficial thrombosis noted in the proximal few inches of the GSV. No Baker's cyst.    ARTERIAL  ABI completed: Biphasic waveforms from the CFA through the popliteal artery. No areas of stenosis noted.     RIGHT    LEFT     PRESSURE WAVEFORM  PRESSURE WAVEFORM  BRACHIAL 129 Triphasic  BRACHIAL 124 Triphasic   DP 137 Triphasic  DP 112 Biphasic   AT   AT    PT 135 Triphasic  PT 127 Biphasic   PER    PER    GREAT TOE  NA GREAT TOE  NA    RIGHT LEFT  ABI [id number]   - Not certain why I can't feel her pedal pulses other than possible vasoconstriction but the ABI demonstrate no PAD - LLE DVT noted: defer choice of anticoagulation to ED - Available as needed.  Adele Barthel, MD [facility] [place] Office: [phone] Pager: [phone]  12/03/2015, 5:11 PM

## 2015-12-03 NOTE — H&P (Signed)
[facility] History and Physical  Nicole Good [id number] DOB: May 30, 1941 DOA: 12/03/2015  Referring physician: Dr. Daleen Bo PCP: Mikle Bosworth, MD   Chief Complaint: Left leg swelling  HPI:  75 year old female with history of hypertension, hyperlipidemia and history of PE treated with Coumadin and Lovenox about 4 years back presented to the [facility] ED with weakness and fatigue for past 2 weeks. Reported some dizziness. While waiting in the ED she developed acute swelling in her left leg. She had significant Significantly heavy on the left leg. Patient denies any headache, blurred vision, nausea, vomiting, chest pain, palpitations or shortness of breath. He has any abdominal pain bowel urinary symptoms. Denies recent travel or trauma. Denies family history of blood clots. While she was having blood draw in the [place] she had a near-syncopal episode with heart rate going up to 160s transiently and subsequently felt better. The distal pulses were not palpable. Given concern for acute compartment syndrome vascular surgeon was consulted who recommended transferring patient to [facility]. Patient was transferred to [facility] . Patient was evaluated by vascular surgeon in the ED. ABI was done which were no peripheral arterial disease. Patient had a lower extremity Doppler done which showed extensive DVT stemming from common femoral, femoral vein, popliteal vein, gastrocnemius, posterior tibial and peroneal vein. Recent given a dose of IV Lovenox and hospitalist admission requested on observation.   Review of Systems:  As outlined in history of present illness, 12 point review of systems otherwise unremarkable.   Past Medical History  Diagnosis Date  . High cholesterol   . Hypertension   . Pulmonary emboli [facility])    Past Surgical History  Procedure Laterality Date  . Dilitation and curretage    . Abdominal hysterectomy     Social History:  reports that she has  never smoked. She has never used smokeless tobacco. She reports that she does not drink alcohol or use illicit drugs.  No Known Allergies  Family History  Problem Relation Age of Onset  . Stroke Mother   . Heart disease Mother   . Hypertension Maternal Grandmother     Prior to Admission medications   Medication Sig Start Date End Date Taking? Authorizing Provider  aspirin EC 81 MG tablet Take 81 mg by mouth every morning.    Yes Historical Provider, MD  atorvastatin (LIPITOR) 10 MG tablet Take 10 mg by mouth at bedtime.    Yes Historical Provider, MD  fish oil-omega-3 fatty acids 1000 MG capsule Take 1 g by mouth daily.   Yes Historical Provider, MD  hydrochlorothiazide (HYDRODIURIL) 25 MG tablet Take 25 mg by mouth every morning.    Yes Historical Provider, MD  Multiple Vitamins-Minerals (MULTIVITAMINS THER. W/MINERALS) TABS Take 1 tablet by mouth every morning.    Yes Historical Provider, MD  azithromycin (ZITHROMAX Z-PAK) 250 MG tablet 2 tablets on first day, then 1 tablet a day on days 2-5 Patient not taking: Reported on 12/03/2015 09/08/15   Fransico Meadow, PA-C  dexamethasone (DECADRON) 4 MG tablet Take 1 tablet (4 mg total) by mouth 2 (two) times daily with a meal. Patient not taking: Reported on 12/03/2015 10/02/15   Lily Kocher, PA-C  meloxicam (MOBIC) 7.5 MG tablet Take 1 tablet (7.5 mg total) by mouth 2 (two) times daily after a meal. Patient not taking: Reported on 12/03/2015 10/02/15   Lily Kocher, PA-C     Physical Exam:  Filed Vitals:   12/03/15 1458 12/03/15 1515 12/03/15  1720 12/03/15 1737  BP: 114/78 107/73 100/77 108/65  Pulse: 64 63 62 62  Temp:      TempSrc:      Resp: 19 20 15 16   Height:      Weight:      SpO2: 98% 96% 100% 100%    Constitutional: Vital signs reviewed.  Elderly female not in distress HEENT: no pallor, no icterus, moist oral mucosa, no cervical lymphadenopathy Cardiovascular: RRR, S1 normal, S2 normal, no MRG Chest: CTAB, no wheezes,  rales, or rhonchi Abdominal: Soft. Non-tender, non-distended, bowel sounds are normal, Ext: warm, significant swelling over left extremity extending from the thigh up to the ankles  Neurological: A&O x3, non focal  Labs on Admission:  Basic Metabolic Panel:  Recent Labs Lab 12/03/15 1344 12/03/15 1347  NA 140 143  K 4.0 4.1  CL 105 104  CO2 25  --   GLUCOSE 162* 153*  BUN 33* 36*  CREATININE 1.47* 1.50*  CALCIUM 9.4  --    Liver Function Tests:  Recent Labs Lab 12/03/15 1344  AST 25  ALT 20  ALKPHOS 70  BILITOT 0.8  PROT 8.1  ALBUMIN 4.4   No results for input(s): LIPASE, AMYLASE in the last 168 hours. No results for input(s): AMMONIA in the last 168 hours. CBC:  Recent Labs Lab 12/03/15 1344 12/03/15 1347  WBC 8.7  --   NEUTROABS 6.7  --   HGB 13.2 15.0  HCT 40.4 44.0  MCV 92.4  --   PLT 185  --    Cardiac Enzymes: No results for input(s): CKTOTAL, CKMB, CKMBINDEX, TROPONINI in the last 168 hours. BNP: Invalid input(s): POCBNP CBG: No results for input(s): GLUCAP in the last 168 hours.  Radiological Exams on Admission: No results found.  EKG: Normal sinus rhythm at 90, no ST-T changes  Assessment/Plan Principal Problem:   DVT (deep venous thrombosis), left Extensive acute DVT over left leg. No CT etiology. Patient has history of PE treated with anticoagulation about 4 years back. Admit under observation to telemetry for 24 hours. Received her dose of therapeutic Lovenox  in the ED. Given her GFR less than 40, I would place her on IV heparin if she stays in the hospital and get discharged tomorrow send her on no anticoagulation. - reportedly had a near-syncopal episode with HR going up to 160s in the ED. Vitals are currently stable. Obtain a CT angiogram of the chest to rule out acute PE. Pain control with when necessary hydrocodone. Given her history of PE and current episode of significant DVT she will likely need lifelong  anticoagulation.  Active Problems: Essential hypertension Hold HCTZ given her elevated creatinine. Monitor morning    Hyperlipidemia Continue statin.  Diet: Regular  DVT prophylaxis: IV heparin   Code Status: full code Family Communication: discussed with patient and her husband at bedside Disposition Plan: admit to obs tele  Louellen Molder [facility] Pager [phone]  Total time spent on admission :50 minutes  If 7PM-7AM, please contact night-coverage [email or web address] Password [facility] 12/03/2015, 5:57 PM

## 2015-12-03 NOTE — Progress Notes (Signed)
Report received from [facility] for admission to [phone]

## 2015-12-03 NOTE — ED Notes (Signed)
PT c/o generalized fatigue and denies any cold symptoms or body aches recently. PT denies any urinary symptoms.

## 2015-12-03 NOTE — ED Notes (Signed)
When lab was attempting to draw blood that was ordered pt became pale, clammy and HR noted to be 160 at that time. Now pt in triage and stated she was feeling better and denied any SOB.

## 2015-12-03 NOTE — ED Notes (Addendum)
Pt's left leg became swollen, red and warm to touch all of a sudden. Pt reporting "crampy" pain in left upper leg. No palpable pulses in left leg, no pulses found with doppler. Dr. Wyvonnia Dusky notified and coming to bedside.

## 2015-12-03 NOTE — ED Notes (Signed)
Pt transported to US

## 2015-12-03 NOTE — ED Notes (Signed)
Pt transported to CT ?

## 2015-12-03 NOTE — ED Provider Notes (Signed)
3:10 PM Patient seen at this time. No complaints. She has color change to left leg with mild cramping, swelling described as a catch. No palpable DP or PT pulses. Minimal tenderness and pain without any chest pain or SOB. She has history of PE approximately 4 years ago and was on anticoagulation for some time. Patient exercises on a NordicTrack machine for an hour a day, 5 days a week with no previous claudication.   Spoke with Dr. Bridgett Larsson. Asks for stat arterial duplex. Will try to facilitate this testing.   3:18 PM Spoke with vascular lab and informed them that patient is in urgent need of testing.   Spoke with Dr. Eulis Foster who will see patient. Venous doppler order also added.   BP 114/78 mmHg  Pulse 64  Temp(Src) 98.6 F (37 C) (Oral)  Resp 19  Ht 5' 5.5" (1.664 m)  Wt 79.379 kg  BMI 28.67 kg/m2  SpO2 98%'  3:32 PM Dr. Bridgett Larsson has seen patient. His concern is for DVT. Will follow results.   4:51 PM Patient with extensive LLE DVT per [place] tech. Slightly below normal ABI on left, normal ABI right.   5:19 PM Spoke with Dr. Bridgett Larsson regarding results. Agrees with anticoagulation. No indication for vascular intervention at this time. Lovenox ordered. Will defer oral anticoagulation decision to inpatient MD.   BP 107/73 mmHg  Pulse 63  Temp(Src) 98.6 F (37 C) (Oral)  Resp 20  Ht 5' 5.5" (1.664 m)  Wt 79.379 kg  BMI 28.67 kg/m2  SpO2 96%  5:38 PM Spoke with Dr. Clementeen Graham of [facility] who will admit.   Patient updated on results. Back pain improved with medication.    Carlisle Cater, PA-C 12/03/15 [place], MD 12/05/15 [phone]

## 2015-12-03 NOTE — ED Provider Notes (Signed)
CSN: [id number]     Arrival date & time 12/03/15  1133 History   First MD Initiated Contact with Patient 12/03/15 1339     Chief Complaint  Patient presents with  . Fatigue     (Consider location/radiation/quality/duration/timing/severity/associated sxs/prior Treatment) HPI Comments: Level 5 caveat for acuity of condition. Called to see patient as soon as she arrived to room by nurse. Patient developed left leg pain and swelling while in triage. She was coming to the ER for 2 weeks of dizziness, fatigue and exhaustion. She denies any nausea, vomiting or fever. No chest pain or shortness of breath. While having blood drawn in triage patient had a near syncopal episode with heart rate to 160s transiently. She now feels better. After this episode patient developed pain to her left leg. There are no palpable pulses and leg is dusky. Patient reports history of PE in the past and is no longer anticoagulated. Denies any chest, back, abdominal pain.  The history is provided by the patient and a relative.    Past Medical History  Diagnosis Date  . High cholesterol   . Hypertension   . Pulmonary emboli [facility])    Past Surgical History  Procedure Laterality Date  . Dilitation and curretage    . Abdominal hysterectomy     Family History  Problem Relation Age of Onset  . Stroke Mother   . Heart disease Mother   . Hypertension Maternal Grandmother    Social History  Substance Use Topics  . Smoking status: Never Smoker   . Smokeless tobacco: Never Used  . Alcohol Use: No   OB History    Gravida Para Term Preterm AB TAB SAB Ectopic Multiple Living   3 1 1  2  1 1  1      Review of Systems  Unable to perform ROS: Acuity of condition      Allergies  Review of patient's allergies indicates no known allergies.  Home Medications   Prior to Admission medications   Medication Sig Start Date End Date Taking? Authorizing Provider  aspirin EC 81 MG tablet Take 81 mg by mouth every  morning.     Historical Provider, MD  atorvastatin (LIPITOR) 10 MG tablet Take 10 mg by mouth at bedtime.     Historical Provider, MD  azithromycin (ZITHROMAX Z-PAK) 250 MG tablet 2 tablets on first day, then 1 tablet a day on days 2-5 09/08/15   Fransico Meadow, PA-C  dexamethasone (DECADRON) 4 MG tablet Take 1 tablet (4 mg total) by mouth 2 (two) times daily with a meal. 10/02/15   Lily Kocher, PA-C  fish oil-omega-3 fatty acids 1000 MG capsule Take 1 g by mouth daily.    Historical Provider, MD  hydrochlorothiazide (HYDRODIURIL) 25 MG tablet Take 25 mg by mouth every morning.     Historical Provider, MD  meloxicam (MOBIC) 7.5 MG tablet Take 1 tablet (7.5 mg total) by mouth 2 (two) times daily after a meal. 10/02/15   Lily Kocher, PA-C  Multiple Vitamins-Minerals (MULTIVITAMINS THER. W/MINERALS) TABS Take 1 tablet by mouth every morning.     Historical Provider, MD   BP 117/101 mmHg  Pulse 167  Temp(Src) 98.6 F (37 C) (Oral)  Resp 24  Ht 5' 5.5" (1.664 m)  Wt 175 lb (79.379 kg)  BMI 28.67 kg/m2  SpO2 100% Physical Exam  Constitutional: She is oriented to person, place, and time. She appears well-developed and well-nourished. No distress.  HENT:  Head: Normocephalic and atraumatic.  Mouth/Throat: Oropharynx is clear and moist. No oropharyngeal exudate.  Eyes: Conjunctivae and EOM are normal. Pupils are equal, round, and reactive to light.  Neck: Normal range of motion. Neck supple.  No meningismus.  Cardiovascular: Normal rate, regular rhythm, normal heart sounds and intact distal pulses.   No murmur heard. Pulmonary/Chest: Effort normal and breath sounds normal. No respiratory distress.  Abdominal: Soft. There is no tenderness. There is no rebound and no guarding.  Musculoskeletal: Normal range of motion. She exhibits edema and tenderness.  Left leg is dusky, foot is cool. Unable to palpate or Doppler femoral, DP or PT pulses. There is some tightness to patient's calf and pain  with range of motion of ankle.  Intact DP and PT pulses on right side.  Neurological: She is alert and oriented to person, place, and time. No cranial nerve deficit. She exhibits normal muscle tone. Coordination normal.  No ataxia on finger to nose bilaterally. No pronator drift. 5/5 strength throughout. CN 2-12 intact.Equal grip strength. Sensation intact.   Skin: Skin is warm.  Psychiatric: She has a normal mood and affect. Her behavior is normal.  Nursing note and vitals reviewed.   ED Course  Procedures (including critical care time) Labs Review Labs Reviewed  CBC WITH DIFFERENTIAL/PLATELET  COMPREHENSIVE METABOLIC PANEL  PROTIME-INR    Imaging Review No results found. I have personally reviewed and evaluated these images and lab results as part of my medical decision-making.   EKG Interpretation   Date/Time:  Monday December 03 2015 13:10:06 EDT Ventricular Rate:  90 PR Interval:  142 QRS Duration: 66 QT Interval:  366 QTC Calculation: 447 R Axis:   4 Text Interpretation:  Normal sinus rhythm Possible Left atrial enlargement  Borderline ECG Confirmed by Christy Gentles  MD, Elenore Rota ([id number]) on 12/03/2015  1:19:22 PM      MDM   Final diagnoses:  None  Patient with acute onset of left leg pain, swelling, unable to palpate pulses. Concern for acute limb ischemia. Unable to palpate or Doppler pulses. Possible early compartment syndrome with tightness in calf.   Immediately discussed with vascular surgery Dr. Bridgett Larsson. He recommends emergency transfer to [facility]. Does not recommend starting heparin. Does not recommend waiting for CT angiogram.  D/w Dr. Tomi Bamberger who accepts patient to [facility] ED.  Dr. Bridgett Larsson aware.  No heparin per Dr. Bridgett Larsson.  Vitals stable for transfer.  BP 111/82 mmHg  Pulse 71  Temp(Src) 98.6 F (37 C) (Oral)  Resp 15  Ht 5' 5.5" (1.664 m)  Wt 175 lb (79.379 kg)  BMI 28.67 kg/m2  SpO2 98%  CRITICAL CARE Performed by: Ezequiel Essex Total critical  care time: 31 minutes Critical care time was exclusive of separately billable procedures and treating other patients. Critical care was necessary to treat or prevent imminent or life-threatening deterioration. Critical care was time spent personally by me on the following activities: development of treatment plan with patient and/or surrogate as well as nursing, discussions with consultants, evaluation of patient's response to treatment, examination of patient, obtaining history from patient or surrogate, ordering and performing treatments and interventions, ordering and review of laboratory studies, ordering and review of radiographic studies, pulse oximetry and re-evaluation of patient's condition.   Ezequiel Essex, MD 12/03/15 [phone]

## 2015-12-04 DIAGNOSIS — I2699 Other pulmonary embolism without acute cor pulmonale: Principal | ICD-10-CM

## 2015-12-04 DIAGNOSIS — E785 Hyperlipidemia, unspecified: Secondary | ICD-10-CM

## 2015-12-04 DIAGNOSIS — I82402 Acute embolism and thrombosis of unspecified deep veins of left lower extremity: Secondary | ICD-10-CM

## 2015-12-04 DIAGNOSIS — I82409 Acute embolism and thrombosis of unspecified deep veins of unspecified lower extremity: Secondary | ICD-10-CM | POA: Diagnosis not present

## 2015-12-04 DIAGNOSIS — I10 Essential (primary) hypertension: Secondary | ICD-10-CM | POA: Diagnosis not present

## 2015-12-04 DIAGNOSIS — Z7901 Long term (current) use of anticoagulants: Secondary | ICD-10-CM | POA: Insufficient documentation

## 2015-12-04 LAB — BASIC METABOLIC PANEL
Anion gap: 9 (ref 5–15)
BUN: 32 mg/dL — AB (ref 6–20)
CHLORIDE: 108 mmol/L (ref 101–111)
CO2: 22 mmol/L (ref 22–32)
CREATININE: 1.36 mg/dL — AB (ref 0.44–1.00)
Calcium: 9.3 mg/dL (ref 8.9–10.3)
GFR calc Af Amer: 43 mL/min — ABNORMAL LOW (ref >60)
GFR calc non Af Amer: 37 mL/min — ABNORMAL LOW (ref >60)
Glucose, Bld: 104 mg/dL — ABNORMAL HIGH (ref 65–99)
POTASSIUM: 4.2 mmol/L (ref 3.5–5.1)
Sodium: 139 mmol/L (ref 135–145)

## 2015-12-04 LAB — CBC
HEMATOCRIT: 34.2 % — AB (ref 36.0–46.0)
Hemoglobin: 10.8 g/dL — ABNORMAL LOW (ref 12.0–15.0)
MCH: 28.8 pg (ref 26.0–34.0)
MCHC: 31.6 g/dL (ref 30.0–36.0)
MCV: 91.2 fL (ref 78.0–100.0)
Platelets: 143 10*3/uL — ABNORMAL LOW (ref 150–400)
RBC: 3.75 MIL/uL — AB (ref 3.87–5.11)
RDW: 13.7 % (ref 11.5–15.5)
WBC: 9.3 10*3/uL (ref 4.0–10.5)

## 2015-12-04 MED ORDER — WARFARIN SODIUM 5 MG PO TABS: 5.0000 mg | ORAL_TABLET | Freq: Every day | ORAL | Status: DC

## 2015-12-04 MED ORDER — ENOXAPARIN SODIUM 80 MG/0.8ML ~~LOC~~ SOLN: 80.0000 mg | Freq: Two times a day (BID) | SUBCUTANEOUS | Status: DC

## 2015-12-04 NOTE — Progress Notes (Signed)
Pt discharged home instable condition with no concerns voiced. discharge education provided with pt voicing understanding the teaching

## 2015-12-04 NOTE — Care Management Note (Addendum)
Case Management Note  Patient Details  Name: Nicole Good MRN: [id number] Date of Birth: 1941-04-25  Subjective/Objective:                    Action/Plan:  Below explained to patient, she states she has already discussed with her doctor and she prefers Coumadin .  S/W [facility] @ [employer] @ [facility] # [id number]   XARETLO 20 MG PO DAILY FOR 30   COVER- YES  CO-PAY- $ 47.00   Q/L 30 FOR 30  TIER- 3 DRUG  PRIOR APPROVAL - NO  PHARMACY: [facility] OR [facility]   Referral for [place], 30 day free card given and explained to patient . Entered benefits check for co pay for Xarelto 20 mg PO daily x 30 days , will notify patient of cost as soon as completed.  Patient voiced understanding.  Expected Discharge Date:                  Expected Discharge Plan:  Home/Self Care  In-House Referral:     Discharge planning Services  Medication Assistance  Post Acute Care Choice:    Choice offered to:  Patient  DME Arranged:    DME Agency:     HH Arranged:    [place] Agency:     Status of Service:  In process, will continue to follow  Medicare Important Message Given:    Date Medicare IM Given:    Medicare IM give by:    Date Additional Medicare IM Given:    Additional Medicare Important Message give by:     If discussed at [place] of Stay Meetings, dates discussed:    Additional Comments:  Marilu Favre, RN 12/04/2015, 8:28 AM

## 2015-12-04 NOTE — Care Management Obs Status (Signed)
MEDICARE OBSERVATION STATUS NOTIFICATION   Patient Details  Name: JORDIS EZZO MRN: [id number] Date of Birth: 1941/07/29   Medicare Observation Status Notification Given:  Yes (DVT work up )    Marilu Favre, RN 12/04/2015, 8:27 AM

## 2015-12-04 NOTE — Care Management (Signed)
Patient discharging on Lovenox 80 mg SQ BID x 10 days .  [facility] uses [facility] on [address] , [place] phone [phone]   Spoke with [place] at [facility] at above location they have medicine in stock however, co pay is $375.02 . Patient aware and states she still wants to go home on Lovenox and Coumadin.   Magdalen Spatz RN BSN [phone]

## 2015-12-04 NOTE — Discharge Summary (Signed)
Physician Discharge Summary  Nicole Good [id number] DOB: July 20, 1941 DOA: 12/03/2015  PCP: Mikle Bosworth, MD  Admit date: 12/03/2015 Discharge date: 12/04/2015  Time spent: 25 min minutes  Recommendations for Outpatient Follow-up:  1. Follow-up PCP in 3 days to check PT/INR 2. Check CBC in 1 week   Discharge Diagnoses:  Principal Problem:   DVT (deep venous thrombosis), left Active Problems:   DVT (deep venous thrombosis), unspecified laterality   Hx pulmonary embolism   Essential hypertension   Hyperlipidemia   Discharge Condition: Stable  Diet recommendation: Regular diet  Filed Weights   12/03/15 1151 12/03/15 2045  Weight: 79.379 kg (175 lb) 79.1 kg (174 lb 6.1 oz)    History of present illness:  75 year old female with history of hypertension, hyperlipidemia and history of PE treated with Coumadin and Lovenox about 4 years back presented to the [facility] ED with weakness and fatigue for past 2 weeks. Reported some dizziness. While waiting in the ED she developed acute swelling in her left leg. She had significant Significantly heavy on the left leg. Patient denies any headache, blurred vision, nausea, vomiting, chest pain, palpitations or shortness of breath. He has any abdominal pain bowel urinary symptoms. Denies recent travel or trauma. Denies family history of blood clots. While she was having blood draw in the [place] she had a near-syncopal episode with heart rate going up to 160s transiently and subsequently felt better. The distal pulses were not palpable. Given concern for acute compartment syndrome vascular surgeon was consulted who recommended transferring patient to [facility]. Patient was transferred to [facility] . Patient was evaluated by vascular surgeon in the ED. ABI was done which were no peripheral arterial disease. Patient had a lower extremity Doppler done which showed extensive DVT stemming from common femoral, femoral vein, popliteal vein,  gastrocnemius, posterior tibial and peroneal vein.  Hospital Course:  DVT/pulmonary embolism- patient was admitted with DVT of left lower extremity, CT angiogram showed acute pulmonary embolism with no right heart strain. Patient started on anticoagulation with Lovenox 80 mg subcutaneous every 12 hours. I discussed in detail with patient regarding Coumadin versus Xarelto. Patient has opted for Coumadin as she has taken Coumadin before for pulmonary embolism 4 years ago. Will discharge patient on Lovenox 80 mg subcutaneous every 12 hours as bridging with Coumadin 5 mg by mouth daily. Patient will check with her PCP in 3 days to check the PT/INR. Patient will be given 10 day supply of Lovenox  Hypertension-blood pressure is stable continue HCTZ  Chronic anemia- patient's hemoglobin stays usually around 11-12. This morning hemoglobin 10.8. She can follow-up with PCP to check CBC in 1 week. No signs and symptoms of bleeding.  Patient to follow-up with PCP.  Procedures:  None  Consultations:  None  Discharge Exam: Filed Vitals:   12/03/15 2045 12/04/15 0614  BP: 91/77 124/81  Pulse: 60 63  Temp: 98.9 F (37.2 C) 99 F (37.2 C)  Resp: 18 18    General: Appears in no acute distress Cardiovascular: S1-S2 regular Respiratory: Clear to auscultation bilaterally  Discharge Instructions   Discharge Instructions    Diet - low sodium heart healthy    Complete by:  As directed      Discharge instructions    Complete by:  As directed   Take Lovenox 80 mg subcutaneously twice a day, along with Coumadin 5 mg by mouth daily. Have your levels of INR checked by PCP in 3 days, need to continue  taking both till INR greater than 2.0. Once INR more than 2.0,  you can stop Lovenox.     Increase activity slowly    Complete by:  As directed           Current Discharge Medication List    START taking these medications   Details  enoxaparin (LOVENOX) 80 MG/0.8ML injection Inject 0.8 mLs (80 mg  total) into the skin every 12 (twelve) hours. Qty: 20 Syringe, Refills: 0    warfarin (COUMADIN) 5 MG tablet Take 1 tablet (5 mg total) by mouth daily at 6 PM. Qty: 30 tablet, Refills: 2      CONTINUE these medications which have NOT CHANGED   Details  atorvastatin (LIPITOR) 10 MG tablet Take 10 mg by mouth at bedtime.     fish oil-omega-3 fatty acids 1000 MG capsule Take 1 g by mouth daily.    hydrochlorothiazide (HYDRODIURIL) 25 MG tablet Take 25 mg by mouth every morning.     Multiple Vitamins-Minerals (MULTIVITAMINS THER. W/MINERALS) TABS Take 1 tablet by mouth every morning.       STOP taking these medications     aspirin EC 81 MG tablet      azithromycin (ZITHROMAX Z-PAK) 250 MG tablet      dexamethasone (DECADRON) 4 MG tablet      meloxicam (MOBIC) 7.5 MG tablet        No Known Allergies Follow-up Information    Follow up with Mikle Bosworth, MD In 3 days.   Specialty:  Internal Medicine   Why:  Check PT/INR in 3 days   Contact information:   [place] [address] [phone]        The results of significant diagnostics from this hospitalization (including imaging, microbiology, ancillary and laboratory) are listed below for reference.    Significant Diagnostic Studies: Ct Angio Chest Pe W/cm &/or Wo Cm  12/03/2015  CLINICAL DATA:  Left lower extremity DVT. History of pulmonary embolism. Low back pain. EXAM: CT ANGIOGRAPHY CHEST WITH CONTRAST TECHNIQUE: Multidetector CT imaging of the chest was performed using the standard protocol during bolus administration of intravenous contrast. Multiplanar CT image reconstructions and MIPs were obtained to evaluate the vascular anatomy. CONTRAST:  91mL OMNIPAQUE IOHEXOL 350 MG/ML SOLN COMPARISON:  09/08/2015 chest radiograph. FINDINGS: Mediastinum/Nodes: The study is high quality for the evaluation of pulmonary embolism. There are several acute segmental and subsegmental pulmonary emboli in the bilateral  upper lobes, right middle lobe and bilateral lower lobes. No central pulmonary emboli. Great vessels are normal in course and caliber. Normal heart size. No right ventricle dilation. No pericardial fluid/thickening. Normal visualized thyroid. Fluid-filled thoracic esophagus with mild circumferential wall thickening in the lower thoracic esophagus. No pathologically enlarged axillary, mediastinal or hilar lymph nodes. Lungs/Pleura: No pneumothorax. No pleural effusion. No acute consolidative airspace disease, significant pulmonary nodules or lung masses. Mild hypoventilatory changes in the dependent lungs. Upper abdomen: Small hiatal hernia. Incomplete visualization of low-density lesions in the anterior upper left kidney. Musculoskeletal:  No aggressive appearing focal osseous lesions. Review of the MIP images confirms the above findings. IMPRESSION: 1. Acute bilateral pulmonary embolism involving segmental and subsegmental pulmonary arteries of all lung lobes, overall low to moderate clot burden. 2. No CT evidence of pulmonary arterial hypertension or right heart strain. 3. Small hiatal hernia. Fluid in the thoracic esophagus, suggesting esophageal dysmotility and/or gastroesophageal reflux. Mild circumferential wall thickening in the lower thoracic esophagus is nonspecific, statistically most commonly secondary to reflux  esophagitis. Critical Value/emergent results were called by telephone at the time of interpretation on 12/03/2015 at 6:52 pm to Dr. Eulis Foster, who verbally acknowledged these results. Electronically Signed   By: Ilona Sorrel M.D.   On: 12/03/2015 18:53   Mr Brain Wo Contrast  11/12/2015   [facility] [address] [place], [place] [address] [phone] NEUROIMAGING REPORT STUDY DATE: 11/10/2015 PATIENT NAME: ALLYX PINNIX DOB: [birth year] MRN: [id number] EXAM: MRI Brain without contrast ORDERING CLINICIAN: Kathrynn Ducking M.D. CLINICAL HISTORY: 75 year old woman with  right arm weakness COMPARISON FILMS: None TECHNIQUE: MRI of the brain without contrast was obtained utilizing 5 mm axial slices with T1, T2, T2 flair, SWI and diffusion weighted views.  T1 sagittal and T2 coronal views were obtained. CONTRAST: none IMAGING SITE: [place] [facility], [place], [place] FINDINGS: On sagittal images, the spinal cord is imaged caudally to C4 and is normal in caliber.   The contents of the posterior fossa are of normal size and position.   The pituitary gland and optic chiasm appear normal.    Brain volume appears normal for age.   The ventricles are normal in size for age and without distortion.  There are no abnormal extra-axial collections of fluid.  The cerebellum and brainstem appears normal.   The deep gray matter appears normal.  There are scattered T2/FLAIR hyperintense foci in the subcortical and deep matter more than the periventricular white matter. Small confluencies are noted in the right low narrative the ventricles bilaterally..  The orbits appear normal.   The VIIth/VIIIth nerve complex appears normal.  The mastoid air cells appear normal.  The paranasal sinuses appear normal.  Flow voids are identified within the major intracerebral arteries.   Diffusion weighted images are normal.  Susceptibility weighted images are normal.    11/12/2015   This MRI of the brain without contrast shows the following: 1.   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 The extent is typical for age. 2.   There are no acute findings INTERPRETING PHYSICIAN: Richard A. Felecia Shelling, MD, PhD Certified in  Neuroimaging by [employer]     Labs: Basic Metabolic Panel:  Recent Labs Lab 12/03/15 1344 12/03/15 1347 12/04/15 0453  NA 140 143 139  K 4.0 4.1 4.2  CL 105 104 108  CO2 25  --  22  GLUCOSE 162* 153* 104*  BUN 33* 36* 32*   CREATININE 1.47* 1.50* 1.36*  CALCIUM 9.4  --  9.3   Liver Function Tests:  Recent Labs Lab 12/03/15 1344  AST 25  ALT 20  ALKPHOS 70  BILITOT 0.8  PROT 8.1  ALBUMIN 4.4   No results for input(s): LIPASE, AMYLASE in the last 168 hours. No results for input(s): AMMONIA in the last 168 hours. CBC:  Recent Labs Lab 12/03/15 1344 12/03/15 1347 12/04/15 0453  WBC 8.7  --  9.3  NEUTROABS 6.7  --   --   HGB 13.2 15.0 10.8*  HCT 40.4 44.0 34.2*  MCV 92.4  --  91.2  PLT 185  --  143*       Signed:  Jeffifer Rabold S MD.  [facility] 12/04/2015, 3:50 PM

## 2016-10-20 ENCOUNTER — Encounter (HOSPITAL_COMMUNITY): Payer: Self-pay | Admitting: Physical Therapy

## 2016-10-20 ENCOUNTER — Ambulatory Visit (HOSPITAL_COMMUNITY): Payer: Medicare Other | Attending: Family Medicine | Admitting: Physical Therapy

## 2016-10-20 DIAGNOSIS — M545 Low back pain: Secondary | ICD-10-CM | POA: Diagnosis not present

## 2016-10-21 NOTE — Therapy (Signed)
[facility] [address] [place], [place], [address] Phone: [phone]   Fax:  [phone]  Physical Therapy Evaluation  Patient Details  Name: DEAIRRA SPITLER MRN: [id number] Date of Birth: [birth year] Referring Provider: Rhina Brackett, MD  Encounter Date: 10/20/2016      PT End of Session - 10/21/16 0907    Visit Number 1   Number of Visits 1   Authorization Type [employer]    Authorization Time Period 10/20/16 to 10/23/16   PT Start Time [id number]   PT Stop Time 1424   PT Time Calculation (min) 39 min   Activity Tolerance Patient tolerated treatment well;No increased pain   Behavior During Therapy WFL for tasks assessed/performed      Past Medical History:  Diagnosis Date  . Arthritis    "maybe in my right knee and hip" (12/03/2015)  . DVT (deep venous thrombosis) ([place]) 12/03/2015   LLE  . Heart murmur    "when I was a little girl; gone now"  . High cholesterol   . Hypertension   . Pulmonary emboli ([place]) ~ 2012    Past Surgical History:  Procedure Laterality Date  . ABDOMINAL HYSTERECTOMY  ~ 1979   "partial"  . CATARACT EXTRACTION W/ INTRAOCULAR LENS  IMPLANT, BILATERAL Bilateral 2017  . DILATION AND CURETTAGE OF UTERUS    . TONSILLECTOMY  1950    There were no vitals filed for this visit.       Subjective Assessment - 10/20/16 1350    Subjective Pt reports B knee discomfort that has been going on for about 5 years. She only notices the discomfort when she is laying down at the end of the day. She does not have pain, rather it is more just an odd feeling. Her low back started ~3weeks ago when she woke up. It was unexpected. She feels that it has been about the same since it started, it will feel tight in the morning and will go away after she works out. Otherwise, she has some stiffness and achiness in the evening. She works out 5x/week and likes to be very active.    Pertinent History Hx of DVT, HTN, arthritis    How long can you sit comfortably? unlimited    How long can you stand comfortably? unlimited    How long can you walk comfortably? unlimited    Patient Stated Goals decrease the tightness/discomfort    Currently in Pain? No/denies            [facility] PT Assessment - 10/21/16 0001      Assessment   Medical Diagnosis thoracolumbar pain and B knee pain    Referring Provider Rhina Brackett, MD   Onset Date/Surgical Date --  knees several years ago; low back ~3 weeks ago    Next MD Visit none as of now    Prior Therapy none      Balance Screen   Has the patient fallen in the past 6 months No   Has the patient had a decrease in activity level because of a fear of falling?  No   Is the patient reluctant to leave their home because of a fear of falling?  No     Prior Function   Level of Independence Independent   Leisure Pt works out 5x/week and likes to stay busy during the day      Cognition   Overall Cognitive Status Within Functional Limits for tasks assessed  Observation/Other Assessments   Focus on Therapeutic Outcomes (FOTO)  6% limited      Sensation   Light Touch Appears Intact     AROM   Overall AROM Comments lumbar standing AROM full and pain free     Strength   Overall Strength Comments BLE strength grossly 5/5 MMT, except B hip abductor strength which is 4/5 MMT     Flexibility   Soft Tissue Assessment /Muscle Length yes  (+) B hip flexor tightness noted in supine    Hamstrings WNL   Quadriceps WNL   Piriformis WNL     Palpation   Palpation comment non tender with palpation along knee joint line, patellar tendon,      Transfers   Five time sit to stand comments  7.8 sec                    [facility] Adult PT Treatment/Exercise - 10/21/16 0001      Exercises   Exercises Other Exercises   Other Exercises  Standing hip flexor stretch 2x30 sec each; seated trunk rotation x15 reps each with green TB; side stepping with green TB around ankles x5  ft each direction for HEP demo                 PT Education - 10/21/16 0906    Education provided Yes   Education Details eval findings/POC; HEP review and discussed importance of variety with cardio/exercise to allow body to rest; discussed yoga as a good source of flexibility and stability training   Person(s) Educated Patient   Methods Handout;Explanation;Demonstration   Comprehension Returned demonstration;Verbalized understanding          PT Short Term Goals - 10/21/16 0908      PT SHORT TERM GOAL #1   Title Pt will demo good understaning of her HEP to allow her to progress her strength and flexibility at home.    Time 1   Period Days   Status Achieved           PT Long Term Goals - 10/21/16 [id number]      PT LONG TERM GOAL #1   Title N/A due to 1 time visit                Plan - 10/21/16 0910    Clinical Impression Statement Pt is a pleasant and motivated 76yo F referred to OPPT with complaints of low back and bilateral knee discomfort. She denies any change in bowel bladder function as well as numbness and tingling at this time. She demonstrates functional strength that is well beyond what is expected for her age, good BLE strength, and lumbar/knee ROM that is WNL and pain free. She denies and joint line tenderness or discomfort with palpation along her lumbar spine. She is currently exercising 5x/week with up to an hour or more on the treadmill and she is not performing any exercises to improve her flexibility. It is likely that her occasional discomfort is related to her mild limitations in hip flexibility and over conditioning on the treadmill which places stress on her knee joints. Therapist educated pt on the importance of variety and flexibility with her exercise program and reviewed other options she can pursue. She demonstrated good understanding of this and was able to perform her HEP without difficulty. At this time she feels comfortable performing her  HEP and making the discussed changes without need to return to PT. She does not require continued skilled PT  services and is being seen for a one time visit only.    Rehab Potential Excellent   PT Frequency One time visit   PT Treatment/Interventions ADLs/Self Care Home Management;Moist Heat;Therapeutic exercise;Therapeutic activities;Neuromuscular re-education;Patient/family education   PT Next Visit Plan one time visit    PT Home Exercise Plan standing hip flexor stretch 3x30 sec each, sidestepping with theraband 3x45ft at a time, seated trunk rotation with green TB 2x15 each    Consulted and Agree with Plan of Care Patient      Patient will benefit from skilled therapeutic intervention in order to improve the following deficits and impairments:  Impaired flexibility, Decreased activity tolerance  Visit Diagnosis: Low back pain, unspecified back pain laterality, unspecified chronicity, with sciatica presence unspecified      G-Codes - 2016-11-02 0917    Functional Assessment Tool Used FOTO: 6% limited    Functional Limitation Mobility: Walking and moving around   Mobility: Walking and Moving Around Current Status [phone]) At least 1 percent but less than 20 percent impaired, limited or restricted   Mobility: Walking and Moving Around Discharge Status [phone]) At least 1 percent but less than 20 percent impaired, limited or restricted       Problem List Patient Active Problem List   Diagnosis Date Noted  . Acute pulmonary embolism ([place]) 12/04/2015  . DVT (deep venous thrombosis), unspecified laterality 12/03/2015  . DVT (deep venous thrombosis), left 12/03/2015  . Hx pulmonary embolism 12/03/2015  . Essential hypertension 12/03/2015  . Hyperlipidemia 12/03/2015  . Right arm weakness 10/30/2015    9:25 AM,11-02-16 Elly Modena PT, DPT [facility] Out[facility] [address] [place], [place],  [address] Phone: [phone]   Fax:  [phone]  Name: CATANA CROFTON MRN: [id number] Date of Birth: [birth year]

## 2017-12-06 DIAGNOSIS — H04123 Dry eye syndrome of bilateral lacrimal glands: Secondary | ICD-10-CM | POA: Insufficient documentation

## 2017-12-06 DIAGNOSIS — H02421 Myogenic ptosis of right eyelid: Secondary | ICD-10-CM | POA: Insufficient documentation

## 2017-12-06 DIAGNOSIS — Z961 Presence of intraocular lens: Secondary | ICD-10-CM | POA: Insufficient documentation

## 2018-06-02 DIAGNOSIS — G4730 Sleep apnea, unspecified: Secondary | ICD-10-CM | POA: Insufficient documentation

## 2018-06-17 DIAGNOSIS — M5451 Vertebrogenic low back pain: Secondary | ICD-10-CM | POA: Insufficient documentation

## 2018-07-26 ENCOUNTER — Other Ambulatory Visit: Payer: Self-pay

## 2018-07-26 ENCOUNTER — Emergency Department (HOSPITAL_COMMUNITY): Payer: Medicare Other

## 2018-07-26 ENCOUNTER — Emergency Department (HOSPITAL_COMMUNITY)
Admission: EM | Admit: 2018-07-26 | Discharge: 2018-07-26 | Disposition: A | Discharge status: Home / Self Care | Payer: Medicare Other | Attending: Emergency Medicine | Admitting: Emergency Medicine

## 2018-07-26 ENCOUNTER — Encounter (HOSPITAL_COMMUNITY): Payer: Self-pay | Admitting: Emergency Medicine

## 2018-07-26 DIAGNOSIS — I10 Essential (primary) hypertension: Secondary | ICD-10-CM | POA: Insufficient documentation

## 2018-07-26 DIAGNOSIS — J069 Acute upper respiratory infection, unspecified: Secondary | ICD-10-CM | POA: Insufficient documentation

## 2018-07-26 DIAGNOSIS — Z7901 Long term (current) use of anticoagulants: Secondary | ICD-10-CM | POA: Diagnosis not present

## 2018-07-26 DIAGNOSIS — R05 Cough: Secondary | ICD-10-CM | POA: Diagnosis present

## 2018-07-26 DIAGNOSIS — Z79899 Other long term (current) drug therapy: Secondary | ICD-10-CM | POA: Diagnosis not present

## 2018-07-26 DIAGNOSIS — B9789 Other viral agents as the cause of diseases classified elsewhere: Secondary | ICD-10-CM | POA: Diagnosis not present

## 2018-07-26 MED ORDER — BENZONATATE 100 MG PO CAPS: 200.0000 mg | ORAL_CAPSULE | Freq: Three times a day (TID) | ORAL | 0 refills | Status: DC | PRN

## 2018-07-26 MED ORDER — DM-GUAIFENESIN ER 30-600 MG PO TB12: 1.0000 | ORAL_TABLET | Freq: Two times a day (BID) | ORAL | 0 refills | Status: DC

## 2018-07-26 MED ORDER — BENZONATATE 100 MG PO CAPS
200.0000 mg | ORAL_CAPSULE | Freq: Once | ORAL | Status: AC
  Administered 2018-07-26: 200 mg via ORAL
  Filled 2018-07-26: qty 2

## 2018-07-26 NOTE — Discharge Instructions (Addendum)
Rest to make sure you are drinking plenty of fluids.  You may use the medications prescribed to help you with your cough symptom and to help more easily clear the congestion.  Your chest x-ray is clear today, there is no sign of pneumonia or other significant lung infection.

## 2018-07-26 NOTE — ED Triage Notes (Signed)
Patient complaining of cough x 5 days.

## 2018-07-27 DIAGNOSIS — K2270 Barrett's esophagus without dysplasia: Secondary | ICD-10-CM | POA: Insufficient documentation

## 2018-07-27 DIAGNOSIS — K2970 Gastritis, unspecified, without bleeding: Secondary | ICD-10-CM | POA: Insufficient documentation

## 2018-07-28 NOTE — ED Provider Notes (Signed)
[facility] EMERGENCY DEPARTMENT Provider Note   CSN: [id number] Arrival date & time: 07/26/18  1049     History   Chief Complaint Chief Complaint  Patient presents with  . Cough    HPI Nicole Good is a 77 y.o. 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  She endorses having a uri including nasal congestion and drainage which is improving, but continues to have a dry, scratchy, itchy throat causing persistent cough.  She denies chest pain, sob, fevers or chills and has no leg pain or swelling. She has found no alleviators for her cough.  The history is provided by the patient.    Past Medical History:  Diagnosis Date  . Arthritis    "maybe in my right knee and hip" (12/03/2015)  . DVT (deep venous thrombosis) ([place]) 12/03/2015   LLE  . Heart murmur    "when I was a little girl; gone now"  . High cholesterol   . Hypertension   . Pulmonary emboli ([place]) ~ 2012    Patient Active Problem List   Diagnosis Date Noted  . Acute pulmonary embolism ([place]) 12/04/2015  . DVT (deep venous thrombosis), unspecified laterality 12/03/2015  . DVT (deep venous thrombosis), left 12/03/2015  . Hx pulmonary embolism 12/03/2015  . Essential hypertension 12/03/2015  . Hyperlipidemia 12/03/2015  . Right arm weakness 10/30/2015    Past Surgical History:  Procedure Laterality Date  . ABDOMINAL HYSTERECTOMY  ~ 1979   "partial"  . CATARACT EXTRACTION W/ INTRAOCULAR LENS  IMPLANT, BILATERAL Bilateral 2017  . DILATION AND CURETTAGE OF UTERUS    . TONSILLECTOMY  1950     OB History    Gravida  3   Para  1   Term  1   Preterm      AB  2   Living  1     SAB  1   TAB      Ectopic  1   Multiple      Live Births               Home Medications    Prior to Admission medications   Medication Sig Start Date End Date Taking? Authorizing Provider  atorvastatin  (LIPITOR) 10 MG tablet Take 10 mg by mouth at bedtime.     [provider]  benzonatate (TESSALON) 100 MG capsule Take 2 capsules (200 mg total) by mouth 3 (three) times daily as needed. 07/26/18   Evalee Jefferson, PA-C  dextromethorphan-guaiFENesin (MUCINEX DM) 30-600 MG 12hr tablet Take 1 tablet by mouth 2 (two) times daily. 07/26/18   Evalee Jefferson, PA-C  enoxaparin (LOVENOX) 80 MG/0.8ML injection Inject 0.8 mLs (80 mg total) into the skin every 12 (twelve) hours. 12/04/15 12/13/15  Oswald Hillock, MD  fish oil-omega-3 fatty acids 1000 MG capsule Take 1 g by mouth daily.    [provider]  hydrochlorothiazide (HYDRODIURIL) 25 MG tablet Take 25 mg by mouth every morning.     [provider]  Multiple Vitamins-Minerals (MULTIVITAMINS THER. W/MINERALS) TABS Take 1 tablet by mouth every morning.     [provider]  warfarin (COUMADIN) 5 MG tablet Take 1 tablet (5 mg total) by mouth daily at 6 PM. 12/04/15   Oswald Hillock, MD    Family History Family History  Problem Relation Age of Onset  . Stroke Mother   .  Heart disease Mother   . Hypertension Maternal Grandmother     Social History Social History   Tobacco Use  . Smoking status: Never Smoker  . Smokeless tobacco: Never Used  Substance Use Topics  . Alcohol use: No  . Drug use: No     Allergies   Patient has no known allergies.   Review of Systems Review of Systems  Constitutional: Negative for chills and fever.  HENT: Positive for congestion, rhinorrhea and sore throat. Negative for ear pain, sinus pressure, trouble swallowing and voice change.   Eyes: Negative for discharge.  Respiratory: Positive for cough. Negative for shortness of breath, wheezing and stridor.   Cardiovascular: Negative for chest pain.  Gastrointestinal: Negative for abdominal pain.  Genitourinary: Negative.      Physical Exam Updated Vital Signs BP 137/69 (BP Location: Left Arm)   Pulse 60   Temp 98.4 F (36.9 C)  (Oral)   Resp 14   Ht 5\' 6"  (1.676 m)   Wt 80.7 kg   SpO2 100%   BMI 28.73 kg/m   Physical Exam  Constitutional: She is oriented to person, place, and time. She appears well-developed and well-nourished.  HENT:  Head: Normocephalic and atraumatic.  Right Ear: Tympanic membrane and ear canal normal.  Left Ear: Tympanic membrane and ear canal normal.  Nose: Mucosal edema and rhinorrhea present.  Mouth/Throat: Uvula is midline, oropharynx is clear and moist and mucous membranes are normal. No oropharyngeal exudate, posterior oropharyngeal edema, posterior oropharyngeal erythema or tonsillar abscesses.  Eyes: Conjunctivae are normal.  Cardiovascular: Normal rate and normal heart sounds.  Pulmonary/Chest: Effort normal and breath sounds normal. No stridor. No respiratory distress. She has no wheezes. She has no rales.  Frequent cough, no crackles.  Abdominal: Soft. She exhibits no distension. There is no tenderness.  Musculoskeletal: Normal range of motion. She exhibits no edema or tenderness.  Neurological: She is alert and oriented to person, place, and time.  Skin: Skin is warm and dry. No rash noted.  Psychiatric: She has a normal mood and affect.     ED Treatments / Results  Labs (all labs ordered are listed, but only abnormal results are displayed) Labs Reviewed - No data to display  EKG None  Radiology Dg Chest 2 View  Result Date: 07/26/2018 CLINICAL DATA:  Cough EXAM: CHEST - 2 VIEW COMPARISON:  Chest radiographs September 08, 2015 and chest CT December 03, 2015 FINDINGS: There is no edema or consolidation. Heart size and pulmonary vascularity are normal. No adenopathy. There is aortic atherosclerosis. Hiatal hernia better seen on prior studies. No bone lesions. IMPRESSION: No edema or consolidation. There is aortic atherosclerosis. Hiatal hernia, better seen on prior studies. Aortic Atherosclerosis (ICD10-I70.0). Electronically Signed   By: Lowella Grip III M.D.   On:  07/26/2018 11:43    Procedures Procedures (including critical care time)  Medications Ordered in ED Medications  benzonatate (TESSALON) capsule 200 mg (200 mg Oral Given 07/26/18 1357)     Initial Impression / Assessment and Plan / ED Course  I have reviewed the triage vital signs and the nursing notes.  Pertinent labs & imaging results that were available during my care of the patient were reviewed by me and considered in my medical decision making (see chart for details).     Pt with exam and h/o c/w viral uri, improving but with lingering cough.  No sob, no peripheral edema, lungs clear. Exam not c/w PE or chf.  She was given tessalon  here with some perceived improvement in cough frequency.  Prescribed same for home use, also mucinex DM for pt to try if tessalon not fully effective. Plan f/u pcp for persistent sx. CXR clear.  Final Clinical Impressions(s) / ED Diagnoses   Final diagnoses:  Viral URI with cough    ED Discharge Orders         Ordered    benzonatate (TESSALON) 100 MG capsule  3 times daily PRN     07/26/18 1346    dextromethorphan-guaiFENesin (MUCINEX DM) 30-600 MG 12hr tablet  2 times daily     07/26/18 1346           Evalee Jefferson, PA-C 07/28/18 1120    Milton Ferguson, MD 07/28/18 1458

## 2018-09-05 ENCOUNTER — Encounter (HOSPITAL_COMMUNITY): Payer: Self-pay | Admitting: Emergency Medicine

## 2018-09-05 ENCOUNTER — Emergency Department (HOSPITAL_COMMUNITY)
Admission: EM | Admit: 2018-09-05 | Discharge: 2018-09-05 | Disposition: A | Discharge status: Home / Self Care | Payer: Medicare Other | Attending: Emergency Medicine | Admitting: Emergency Medicine

## 2018-09-05 ENCOUNTER — Other Ambulatory Visit: Payer: Self-pay

## 2018-09-05 DIAGNOSIS — N390 Urinary tract infection, site not specified: Secondary | ICD-10-CM | POA: Insufficient documentation

## 2018-09-05 DIAGNOSIS — Z7901 Long term (current) use of anticoagulants: Secondary | ICD-10-CM | POA: Diagnosis not present

## 2018-09-05 DIAGNOSIS — Z79899 Other long term (current) drug therapy: Secondary | ICD-10-CM | POA: Diagnosis not present

## 2018-09-05 DIAGNOSIS — I10 Essential (primary) hypertension: Secondary | ICD-10-CM | POA: Insufficient documentation

## 2018-09-05 DIAGNOSIS — E860 Dehydration: Secondary | ICD-10-CM | POA: Diagnosis not present

## 2018-09-05 DIAGNOSIS — R531 Weakness: Secondary | ICD-10-CM | POA: Diagnosis not present

## 2018-09-05 LAB — URINALYSIS, ROUTINE W REFLEX MICROSCOPIC
Bilirubin Urine: NEGATIVE
GLUCOSE, UA: NEGATIVE mg/dL
Hgb urine dipstick: NEGATIVE
Ketones, ur: NEGATIVE mg/dL
NITRITE: POSITIVE — AB
PH: 6 (ref 5.0–8.0)
Protein, ur: NEGATIVE mg/dL
SPECIFIC GRAVITY, URINE: 1.015 (ref 1.005–1.030)

## 2018-09-05 LAB — BASIC METABOLIC PANEL
ANION GAP: 8 (ref 5–15)
BUN: 29 mg/dL — AB (ref 8–23)
CALCIUM: 9.5 mg/dL (ref 8.9–10.3)
CO2: 26 mmol/L (ref 22–32)
Chloride: 105 mmol/L (ref 98–111)
Creatinine, Ser: 1.59 mg/dL — ABNORMAL HIGH (ref 0.44–1.00)
GFR calc Af Amer: 36 mL/min — ABNORMAL LOW (ref >60)
GFR, EST NON AFRICAN AMERICAN: 31 mL/min — AB (ref >60)
Glucose, Bld: 80 mg/dL (ref 70–99)
POTASSIUM: 3.8 mmol/L (ref 3.5–5.1)
SODIUM: 139 mmol/L (ref 135–145)

## 2018-09-05 LAB — CBC
HCT: 40.5 % (ref 36.0–46.0)
Hemoglobin: 12.6 g/dL (ref 12.0–15.0)
MCH: 29.2 pg (ref 26.0–34.0)
MCHC: 31.1 g/dL (ref 30.0–36.0)
MCV: 93.8 fL (ref 80.0–100.0)
PLATELETS: 205 10*3/uL (ref 150–400)
RBC: 4.32 MIL/uL (ref 3.87–5.11)
RDW: 13.2 % (ref 11.5–15.5)
WBC: 5.8 10*3/uL (ref 4.0–10.5)
nRBC: 0 % (ref 0.0–0.2)

## 2018-09-05 LAB — CBG MONITORING, ED: Glucose-Capillary: 95 mg/dL (ref 70–99)

## 2018-09-05 LAB — TSH: TSH: 0.728 u[IU]/mL (ref 0.350–4.500)

## 2018-09-05 MED ORDER — CEPHALEXIN 250 MG PO CAPS: 250.0000 mg | ORAL_CAPSULE | Freq: Four times a day (QID) | ORAL | 0 refills | Status: DC

## 2018-09-05 NOTE — ED Notes (Signed)
Pt reports increased fatigue for the past three weeks, denies unilateral weakness, slurred speech, or HA. States she is able to do all of her ADLs and work out 5 days a week. Denies GI/GU sx, anemia hx, or new medications.

## 2018-09-05 NOTE — Discharge Instructions (Signed)
Make sure that you drink plenty of fluids, 1 to 2 L of water each day.  This will improve your renal function and make you feel stronger.  We sent a prescription antibiotic to your pharmacy to treat UTI.  When you see your doctor next week make sure that they check your renal function and repeat a urinalysis to see if you still have an infection.

## 2018-09-05 NOTE — ED Triage Notes (Signed)
Pt states that for the past 3 weeks she has been overly tried for her no other s/s.

## 2018-09-05 NOTE — ED Provider Notes (Signed)
[facility] EMERGENCY DEPARTMENT Provider Note   CSN: [id number] Arrival date & time: 09/05/18  1039     History   Chief Complaint Chief Complaint  Patient presents with  . Weakness    HPI Nicole Good is a 77 y.o. female.  HPI   She complains of general weakness for several weeks, without known cause.  She is eating well, stooling well, urinating well, walking well and drove here from her house for evaluation.  She is taking her regular medication.  She wonders if the pantoprazole which she recently started is causing this problem.  She denies stress or depression.  She denies fever, chills, cough, chest pain, headache, neck pain or back pain.  There are no other known modifying factors.  Past Medical History:  Diagnosis Date  . Arthritis    "maybe in my right knee and hip" (12/03/2015)  . DVT (deep venous thrombosis) ([place]) 12/03/2015   LLE  . Heart murmur    "when I was a little girl; gone now"  . High cholesterol   . Hypertension   . Pulmonary emboli ([place]) ~ 2012    Patient Active Problem List   Diagnosis Date Noted  . Acute pulmonary embolism ([place]) 12/04/2015  . DVT (deep venous thrombosis), unspecified laterality 12/03/2015  . DVT (deep venous thrombosis), left 12/03/2015  . Hx pulmonary embolism 12/03/2015  . Essential hypertension 12/03/2015  . Hyperlipidemia 12/03/2015  . Right arm weakness 10/30/2015    Past Surgical History:  Procedure Laterality Date  . ABDOMINAL HYSTERECTOMY  ~ 1979   "partial"  . CATARACT EXTRACTION W/ INTRAOCULAR LENS  IMPLANT, BILATERAL Bilateral 2017  . DILATION AND CURETTAGE OF UTERUS    . TONSILLECTOMY  1950     OB History    Gravida  3   Para  1   Term  1   Preterm      AB  2   Living  1     SAB  1   TAB      Ectopic  1   Multiple      Live Births               Home Medications    Prior to Admission medications   Medication Sig Start Date End Date Taking? Authorizing Provider  apixaban  (ELIQUIS) 5 MG TABS tablet Take 5 mg by mouth at bedtime.   Yes [provider]  atorvastatin (LIPITOR) 10 MG tablet Take 10 mg by mouth at bedtime.    Yes [provider]  Cholecalciferol (VITAMIN D3) 1.25 MG (50000 UT) CAPS Take 1 capsule by mouth daily.   Yes [provider]  hydrochlorothiazide (HYDRODIURIL) 25 MG tablet Take 25 mg by mouth every morning.    Yes [provider]  Multiple Vitamins-Minerals (MULTIVITAMINS THER. W/MINERALS) TABS Take 1 tablet by mouth every morning.    Yes [provider]  pantoprazole (PROTONIX) 40 MG tablet Take 40 mg by mouth daily.   Yes [provider]  rosuvastatin (CRESTOR) 10 MG tablet Take 10 mg by mouth daily.   Yes [provider]  benzonatate (TESSALON) 100 MG capsule Take 2 capsules (200 mg total) by mouth 3 (three) times daily as needed. Patient not taking: Reported on 09/05/2018 07/26/18   Evalee Jefferson, PA-C  cephALEXin (KEFLEX) 250 MG capsule Take 1 capsule (250 mg total) by mouth 4 (four) times daily. 09/05/18   Daleen Bo, MD  enoxaparin (LOVENOX) 80 MG/0.8ML injection Inject 0.8 mLs (  80 mg total) into the skin every 12 (twelve) hours. 12/04/15 12/13/15  Oswald Hillock, MD    Family History Family History  Problem Relation Age of Onset  . Stroke Mother   . Heart disease Mother   . Hypertension Maternal Grandmother     Social History Social History   Tobacco Use  . Smoking status: Never Smoker  . Smokeless tobacco: Never Used  Substance Use Topics  . Alcohol use: No  . Drug use: No     Allergies   Patient has no known allergies.   Review of Systems Review of Systems  All other systems reviewed and are negative.    Physical Exam Updated Vital Signs BP (!) 143/79   Pulse (!) 49   Temp 98.1 F (36.7 C) (Oral)   Resp 17   Ht 5\' 6"  (1.676 m)   Wt 81.2 kg   SpO2 100%   BMI 28.89 kg/m   Physical Exam Vitals signs and nursing note reviewed.    Constitutional:      General: She is not in acute distress.    Appearance: Normal appearance. She is well-developed. She is not ill-appearing or diaphoretic.     Comments: Elderly, frail  HENT:     Head: Normocephalic and atraumatic.     Mouth/Throat:     Mouth: Mucous membranes are moist.     Pharynx: No oropharyngeal exudate or posterior oropharyngeal erythema.  Eyes:     Conjunctiva/sclera: Conjunctivae normal.     Pupils: Pupils are equal, round, and reactive to light.  Neck:     Musculoskeletal: Normal range of motion and neck supple.     Trachea: Phonation normal.     Comments: No thyromegaly or thyroid tenderness Cardiovascular:     Rate and Rhythm: Normal rate and regular rhythm.  Pulmonary:     Effort: Pulmonary effort is normal.     Breath sounds: Normal breath sounds.  Chest:     Chest wall: No tenderness.  Abdominal:     General: There is no distension.     Palpations: Abdomen is soft.     Tenderness: There is no abdominal tenderness. There is no guarding.  Musculoskeletal: Normal range of motion.  Skin:    General: Skin is warm and dry.     Coloration: Skin is not pale.     Findings: No erythema or rash.  Neurological:     Mental Status: She is alert and oriented to person, place, and time.     Cranial Nerves: No cranial nerve deficit.     Sensory: No sensory deficit.     Motor: No weakness or abnormal muscle tone.     Coordination: Coordination normal.  Psychiatric:        Mood and Affect: Mood normal.        Behavior: Behavior normal.        Thought Content: Thought content normal.        Judgment: Judgment normal.      ED Treatments / Results  Labs (all labs ordered are listed, but only abnormal results are displayed) Labs Reviewed  BASIC METABOLIC PANEL - Abnormal; Notable for the following components:      Result Value   BUN 29 (*)    Creatinine, Ser 1.59 (*)    GFR calc non Af Amer 31 (*)    GFR calc Af Amer 36 (*)    All other components  within normal limits  URINALYSIS, ROUTINE W REFLEX MICROSCOPIC - Abnormal;  Notable for the following components:   APPearance HAZY (*)    Nitrite POSITIVE (*)    Leukocytes, UA SMALL (*)    Bacteria, UA RARE (*)    All other components within normal limits  URINE CULTURE  CBC  TSH  CBG MONITORING, ED    EKG EKG Interpretation  Date/Time:  Sunday September 05 2018 11:12:22 EST Ventricular Rate:  58 PR Interval:    QRS Duration: 70 QT Interval:  409 QTC Calculation: 402 R Axis:   24 Text Interpretation:  Sinus rhythm since last tracing no significant change Confirmed by Jerrianne Hartin ([id number]) on 09/05/2018 11:57:42 AM   Radiology No results found.  Procedures Procedures (including critical care time)  Medications Ordered in ED Medications - No data to display   Initial Impression / Assessment and Plan / ED Course  I have reviewed the triage vital signs and the nursing notes.  Pertinent labs & imaging results that were available during my care of the patient were reviewed by me and considered in my medical decision making (see chart for details).  Clinical Course as of Sep 06 1319  Sun Sep 05, 2018  1311 Normal except BUN high, creatinine high, GFR low  Basic metabolic panel(!) [EW]  1312 Normal  TSH [EW]  1312 Normal except dipstick with nitrite and leukocytes.  Microscopic with increased WBCs and bacteria.  Urine culture ordered   [EW]    Clinical Course User Index [EW] Anthany Thornhill, MD     Patient Vitals for the past 24 hrs:  BP Temp Temp src Pulse Resp SpO2 Height Weight  09/05/18 1300 (!) 143/79 - - (!) 49 17 100 % - -  09/05/18 1103 131/73 98.1 F (36.7 C) Oral (!) 58 16 100 % - -  09/05/18 1102 - - - - - - 5\' 6" (1.676 m) 81.2 kg    1:13 PM Reevaluation with update and discussion. After initial assessment and treatment, an updated evaluation reveals no change in clinical status.  Findings discussed with patient and all questions were answered.  Journei Thomassen   Medical Decision Making: Malaise, and weakness, with mild dehydration and likely UTI contributing to the discomfort.  Patient nontoxic.  Doubt serious bacterial infection metabolic instability or impending vascular collapse.  CRITICAL CARE-no Performed by: Jonte Shiller   Nursing Notes Reviewed/ Care Coordinated Applicable Imaging Reviewed Interpretation of Laboratory Data incorporated into ED treatment  The patient appears reasonably screened and/or stabilized for discharge and I doubt any other medical condition or other EMC requiring further screening, evaluation, or treatment in the ED at this time prior to discharge.  Plan: Home Medications-continue usual medicines, Tylenol if needed for pain or fever; Home Treatments-rest, fluids; return here if the recommended treatment, does not improve the symptoms; Recommended follow up-PCP checkup 1 week and as needed     Final Clinical Impressions(s) / ED Diagnoses   Final diagnoses:  Weakness  Urinary tract infection without hematuria, site unspecified  Dehydration    ED Discharge Orders         Ordered    cephALEXin (KEFLEX) 250 MG capsule  4 times daily     12 /29/19 1318           Daleen Bo, MD 09/05/18 1320

## 2018-09-07 LAB — URINE CULTURE
Culture: >=100000 — AB
Special Requests: NORMAL

## 2018-09-08 ENCOUNTER — Telehealth: Payer: Self-pay | Admitting: Emergency Medicine

## 2018-09-08 NOTE — Telephone Encounter (Signed)
Post ED Visit - Positive Culture Follow-up  Culture report reviewed by antimicrobial stewardship pharmacist:  []  Elenor Quinones, Pharm.D. []  Heide Guile, Pharm.D., BCPS AQ-ID []  Parks Neptune, Pharm.D., BCPS []  Alycia Rossetti, Pharm.D., BCPS []  [place], Pharm.D., BCPS, AAHIVP []  Legrand Como, Pharm.D., BCPS, AAHIVP []  Salome Arnt, PharmD, BCPS []  Johnnette Gourd, PharmD, BCPS [x]  Hughes Better, PharmD, BCPS []  Leeroy Cha, PharmD  Positive urine culture Treated with cephalexin, organism sensitive to the same and no further patient follow-up is required at this time.  Hazle Nordmann 09/08/2018, 8:39 AM

## 2018-11-16 ENCOUNTER — Emergency Department (HOSPITAL_COMMUNITY): Payer: Medicare Other

## 2018-11-16 ENCOUNTER — Other Ambulatory Visit: Payer: Self-pay

## 2018-11-16 ENCOUNTER — Emergency Department (HOSPITAL_COMMUNITY)
Admission: EM | Admit: 2018-11-16 | Discharge: 2018-11-16 | Disposition: A | Discharge status: Home / Self Care | Payer: Medicare Other | Attending: Emergency Medicine | Admitting: Emergency Medicine

## 2018-11-16 ENCOUNTER — Encounter (HOSPITAL_COMMUNITY): Payer: Self-pay | Admitting: Emergency Medicine

## 2018-11-16 DIAGNOSIS — M79675 Pain in left toe(s): Secondary | ICD-10-CM | POA: Diagnosis present

## 2018-11-16 DIAGNOSIS — I10 Essential (primary) hypertension: Secondary | ICD-10-CM | POA: Insufficient documentation

## 2018-11-16 DIAGNOSIS — M109 Gout, unspecified: Secondary | ICD-10-CM | POA: Diagnosis not present

## 2018-11-16 DIAGNOSIS — Z79899 Other long term (current) drug therapy: Secondary | ICD-10-CM | POA: Diagnosis not present

## 2018-11-16 DIAGNOSIS — Z7901 Long term (current) use of anticoagulants: Secondary | ICD-10-CM | POA: Insufficient documentation

## 2018-11-16 LAB — BASIC METABOLIC PANEL
Anion gap: 8 (ref 5–15)
BUN: 28 mg/dL — AB (ref 8–23)
CO2: 26 mmol/L (ref 22–32)
Calcium: 9.6 mg/dL (ref 8.9–10.3)
Chloride: 104 mmol/L (ref 98–111)
Creatinine, Ser: 1.36 mg/dL — ABNORMAL HIGH (ref 0.44–1.00)
GFR calc Af Amer: 43 mL/min — ABNORMAL LOW (ref >60)
GFR calc non Af Amer: 37 mL/min — ABNORMAL LOW (ref >60)
Glucose, Bld: 97 mg/dL (ref 70–99)
Potassium: 4.7 mmol/L (ref 3.5–5.1)
Sodium: 138 mmol/L (ref 135–145)

## 2018-11-16 LAB — CBC WITH DIFFERENTIAL/PLATELET
ABS IMMATURE GRANULOCYTES: 0.02 10*3/uL (ref 0.00–0.07)
BASOS PCT: 0 %
Basophils Absolute: 0 10*3/uL (ref 0.0–0.1)
Eosinophils Absolute: 0.2 10*3/uL (ref 0.0–0.5)
Eosinophils Relative: 3 %
HCT: 40.3 % (ref 36.0–46.0)
Hemoglobin: 12.7 g/dL (ref 12.0–15.0)
IMMATURE GRANULOCYTES: 0 %
LYMPHS PCT: 16 %
Lymphs Abs: 1.1 10*3/uL (ref 0.7–4.0)
MCH: 29.8 pg (ref 26.0–34.0)
MCHC: 31.5 g/dL (ref 30.0–36.0)
MCV: 94.6 fL (ref 80.0–100.0)
MONO ABS: 0.5 10*3/uL (ref 0.1–1.0)
MONOS PCT: 7 %
NEUTROS ABS: 5.3 10*3/uL (ref 1.7–7.7)
Neutrophils Relative %: 74 %
PLATELETS: 200 10*3/uL (ref 150–400)
RBC: 4.26 MIL/uL (ref 3.87–5.11)
RDW: 13.4 % (ref 11.5–15.5)
WBC: 7.2 10*3/uL (ref 4.0–10.5)
nRBC: 0 % (ref 0.0–0.2)

## 2018-11-16 LAB — URIC ACID: URIC ACID, SERUM: 7.9 mg/dL — AB (ref 2.5–7.1)

## 2018-11-16 MED ORDER — PREDNISONE 10 MG PO TABS
60.0000 mg | ORAL_TABLET | Freq: Once | ORAL | Status: AC
  Administered 2018-11-16: 60 mg via ORAL
  Filled 2018-11-16: qty 1

## 2018-11-16 MED ORDER — HYDROCODONE-ACETAMINOPHEN 5-325 MG PO TABS
1.0000 | ORAL_TABLET | Freq: Once | ORAL | Status: AC
  Administered 2018-11-16: 1 via ORAL
  Filled 2018-11-16: qty 1

## 2018-11-16 MED ORDER — HYDROCODONE-ACETAMINOPHEN 5-325 MG PO TABS: 1.0000 | ORAL_TABLET | ORAL | 0 refills | Status: DC | PRN

## 2018-11-16 MED ORDER — PREDNISONE 10 MG PO TABS: ORAL_TABLET | ORAL | 0 refills | Status: DC

## 2018-11-16 NOTE — ED Provider Notes (Signed)
[facility] EMERGENCY DEPARTMENT Provider Note   CSN: [id number] Arrival date & time: 11/16/18  1033    History   Chief Complaint Chief Complaint  Patient presents with  . Toe Pain    left great toe    HPI Nicole Good is a 78 y.o. female.     The history is provided by the patient and the spouse.  Toe Pain  This is a new problem. The current episode started 2 days ago. The problem occurs constantly. The problem has been gradually worsening. The symptoms are aggravated by walking and bending (Constant pain but worse with movement, bending, touching.). Nothing relieves the symptoms. She has tried acetaminophen for the symptoms. The treatment provided mild relief.   Patient reports a 2-day history of left great toe pain, mild erythema and edema which is constant but worse with movement and touching the toe, reporting difficulty wearing a shoe at this point in time.  She reports she had a pedicure a week ago, but has no pain or swelling around the distal toe or the nail.  She denies fevers or chills.  Denies history of gout.   Past Medical History:  Diagnosis Date  . Arthritis    "maybe in my right knee and hip" (12/03/2015)  . DVT (deep venous thrombosis) ([place]) 12/03/2015   LLE  . Heart murmur    "when I was a little girl; gone now"  . High cholesterol   . Hypertension   . Pulmonary emboli ([place]) ~ 2012    Patient Active Problem List   Diagnosis Date Noted  . Acute pulmonary embolism ([place]) 12/04/2015  . DVT (deep venous thrombosis), unspecified laterality 12/03/2015  . DVT (deep venous thrombosis), left 12/03/2015  . Hx pulmonary embolism 12/03/2015  . Essential hypertension 12/03/2015  . Hyperlipidemia 12/03/2015  . Right arm weakness 10/30/2015    Past Surgical History:  Procedure Laterality Date  . ABDOMINAL HYSTERECTOMY  ~ 1979   "partial"  . CATARACT EXTRACTION W/ INTRAOCULAR LENS  IMPLANT, BILATERAL Bilateral 2017  . DILATION AND CURETTAGE OF UTERUS    .  TONSILLECTOMY  1950     OB History    Gravida  3   Para  1   Term  1   Preterm      AB  2   Living  1     SAB  1   TAB      Ectopic  1   Multiple      Live Births               Home Medications    Prior to Admission medications   Medication Sig Start Date End Date Taking? Authorizing Provider  apixaban (ELIQUIS) 5 MG TABS tablet Take 5 mg by mouth at bedtime.    [provider]  atorvastatin (LIPITOR) 10 MG tablet Take 10 mg by mouth at bedtime.     [provider]  benzonatate (TESSALON) 100 MG capsule Take 2 capsules (200 mg total) by mouth 3 (three) times daily as needed. Patient not taking: Reported on 09/05/2018 07/26/18   Evalee Jefferson, PA-C  cephALEXin (KEFLEX) 250 MG capsule Take 1 capsule (250 mg total) by mouth 4 (four) times daily. 09/05/18   Daleen Bo, MD  Cholecalciferol (VITAMIN D3) 1.25 MG (50000 UT) CAPS Take 1 capsule by mouth daily.    [provider]  enoxaparin (LOVENOX) 80 MG/0.8ML injection Inject 0.8 mLs (80 mg total) into the skin every 12 (twelve) hours.  12/04/15 12/13/15  Oswald Hillock, MD  hydrochlorothiazide (HYDRODIURIL) 25 MG tablet Take 25 mg by mouth every morning.     [provider]  HYDROcodone-acetaminophen (NORCO/VICODIN) 5-325 MG tablet Take 1 tablet by mouth every 4 (four) hours as needed. 11/16/18   Evalee Jefferson, PA-C  Multiple Vitamins-Minerals (MULTIVITAMINS THER. W/MINERALS) TABS Take 1 tablet by mouth every morning.     [provider]  pantoprazole (PROTONIX) 40 MG tablet Take 40 mg by mouth daily.    [provider]  predniSONE (DELTASONE) 10 MG tablet Take 6 tablets day one, 5 tablets day two, 4 tablets day three, 3 tablets day four, 2 tablets day five, then 1 tablet day six 11/16/18   Meribeth Vitug, Almyra Free, PA-C  rosuvastatin (CRESTOR) 10 MG tablet Take 10 mg by mouth daily.    [provider]    Family History Family History  Problem Relation Age of Onset  . Stroke  Mother   . Heart disease Mother   . Hypertension Maternal Grandmother     Social History Social History   Tobacco Use  . Smoking status: Never Smoker  . Smokeless tobacco: Never Used  Substance Use Topics  . Alcohol use: No  . Drug use: No     Allergies   Patient has no known allergies.   Review of Systems Review of Systems  Constitutional: Negative for fever.  Musculoskeletal: Positive for arthralgias and joint swelling. Negative for myalgias.  Skin: Positive for color change. Negative for wound.  Neurological: Negative for weakness and numbness.     Physical Exam Updated Vital Signs BP (!) 148/91 (BP Location: Right Arm)   Pulse 63   Temp 98.2 F (36.8 C)   Resp 18   Ht 5\' 6"  (1.676 m)   Wt 78 kg   SpO2 100%   BMI 27.76 kg/m   Physical Exam Constitutional:      Appearance: She is well-developed.  HENT:     Head: Atraumatic.  Neck:     Musculoskeletal: Normal range of motion.  Cardiovascular:     Pulses:          Popliteal pulses are 2+ on the right side and 2+ on the left side.     Comments: Pulses equal bilaterally Musculoskeletal:        General: Tenderness present.       Feet:     Comments: Pain and mild erythema without increased warmth isolated to the left MTP joint of the great toe.  There is no distal toe edema or erythema, no drainage and no skin wounds.  No red streaking.  Skin:    General: Skin is warm and dry.  Neurological:     Mental Status: She is alert.     Sensory: No sensory deficit.     Deep Tendon Reflexes: Reflexes normal.      ED Treatments / Results  Labs (all labs ordered are listed, but only abnormal results are displayed) Labs Reviewed  URIC ACID - Abnormal; Notable for the following components:      Result Value   Uric Acid, Serum 7.9 (*)    All other components within normal limits  BASIC METABOLIC PANEL - Abnormal; Notable for the following components:   BUN 28 (*)    Creatinine, Ser 1.36 (*)    GFR calc non  Af Amer 37 (*)    GFR calc Af Amer 43 (*)    All other components within normal limits  CBC WITH DIFFERENTIAL/PLATELET  EKG None  Radiology Dg Toe Great Left  Result Date: 11/16/2018 CLINICAL DATA:  78 year old female with a history swelling and redness EXAM: LEFT GREAT TOE COMPARISON:  None. FINDINGS: No acute displaced fracture. Mild degenerative changes of the interphalangeal joint. No radiopaque foreign body. No focal soft tissue swelling. No subluxation/dislocation IMPRESSION: Negative for acute bony abnormality. Electronically Signed   By: Corrie Mckusick D.O.   On: 11/16/2018 13:04    Procedures Procedures (including critical care time)  Medications Ordered in ED Medications  predniSONE (DELTASONE) tablet 60 mg (60 mg Oral Given 11/16/18 1347)  HYDROcodone-acetaminophen (NORCO/VICODIN) 5-325 MG per tablet 1 tablet (1 tablet Oral Given 11/16/18 1347)     Initial Impression / Assessment and Plan / ED Course  I have reviewed the triage vital signs and the nursing notes.  Pertinent labs & imaging results that were available during my care of the patient were reviewed by me and considered in my medical decision making (see chart for details).        Imaging is reviewed and discussed with patient.  Her exam and labs especially the uric acid elevation most strongly suggests acute gout.  I doubt that this is cellulitis.  She was placed on prednisone and hydrocodone.  We discussed elevation and warm compresses.  Plan follow-up with her primary doctor for recheck in 1 week if her symptoms are not improving.  Also discussed earlier recheck for any worsening symptoms.  She was given information about gout and food choices.  Final Clinical Impressions(s) / ED Diagnoses   Final diagnoses:  Pain of left great toe  Acute gout involving toe of left foot, unspecified cause    ED Discharge Orders         Ordered    predniSONE (DELTASONE) 10 MG tablet     11/16/18 1505     HYDROcodone-acetaminophen (NORCO/VICODIN) 5-325 MG tablet  Every 4 hours PRN     11/16/18 1505           Evalee Jefferson, PA-C 11/16/18 1625    Veryl Speak, MD 11/17/18 [phone]

## 2018-11-16 NOTE — ED Notes (Signed)
Pt's family member in room with patient came out and asked when they were going to be seen and complaining of wait.  RN explained to pt that they will be seen as soon as possible and explained that other patient are being seen at this time.

## 2018-11-16 NOTE — ED Triage Notes (Signed)
Pain to left great toe on Sunday night about 1800.  C/o throbbing and swelling to left great toe, tares pain 8/10.

## 2018-11-16 NOTE — Discharge Instructions (Addendum)
Give your next dose of prednisone tomorrow as discussed as you received today's dose here.  Do not drive within 4 hours of taking hydrocodone as this medication will make you drowsy.  Refer to the instructions below about this condition.

## 2019-07-29 DIAGNOSIS — N1832 Chronic kidney disease, stage 3b: Secondary | ICD-10-CM | POA: Insufficient documentation

## 2019-08-23 ENCOUNTER — Ambulatory Visit (INDEPENDENT_AMBULATORY_CARE_PROVIDER_SITE_OTHER): Payer: Medicare Other | Admitting: Family Medicine

## 2019-08-23 ENCOUNTER — Encounter: Payer: Self-pay | Admitting: Family Medicine

## 2019-08-23 ENCOUNTER — Other Ambulatory Visit: Payer: Self-pay

## 2019-08-23 VITALS — BP 122/78 | HR 70 | Temp 98.4°F | Resp 15 | Ht 66.0 in | Wt 182.0 lb

## 2019-08-23 DIAGNOSIS — R5383 Other fatigue: Secondary | ICD-10-CM

## 2019-08-23 DIAGNOSIS — E785 Hyperlipidemia, unspecified: Secondary | ICD-10-CM | POA: Diagnosis not present

## 2019-08-23 DIAGNOSIS — E663 Overweight: Secondary | ICD-10-CM | POA: Diagnosis not present

## 2019-08-23 DIAGNOSIS — R7301 Impaired fasting glucose: Secondary | ICD-10-CM

## 2019-08-23 DIAGNOSIS — N1832 Chronic kidney disease, stage 3b: Secondary | ICD-10-CM

## 2019-08-23 DIAGNOSIS — K2270 Barrett's esophagus without dysplasia: Secondary | ICD-10-CM

## 2019-08-23 DIAGNOSIS — I2699 Other pulmonary embolism without acute cor pulmonale: Secondary | ICD-10-CM

## 2019-08-23 DIAGNOSIS — I10 Essential (primary) hypertension: Secondary | ICD-10-CM

## 2019-08-23 DIAGNOSIS — E559 Vitamin D deficiency, unspecified: Secondary | ICD-10-CM

## 2019-08-23 NOTE — Assessment & Plan Note (Signed)
Needs updated lab work.  Pending the kidney function might need to have lisinopril added for kidney protective needs.

## 2019-08-23 NOTE — Assessment & Plan Note (Signed)
On Eliquis continue.

## 2019-08-23 NOTE — Assessment & Plan Note (Signed)
Checking labs continue medication at this time.

## 2019-08-23 NOTE — Assessment & Plan Note (Signed)
Controlled continue Protonix at this time.  No signs of symptoms of heartburn.

## 2019-08-23 NOTE — Patient Instructions (Addendum)
  I appreciate the opportunity to provide you with care for your health and wellness. Today we discussed: establish care  Follow up: 3 months   Labs placed today-fasting please get over the next week when you can. [facility] is on the same floor as our office.  For now we will continue all medications as ordered.  GOALS: continue hydration, eat more veggies and fruits than meats, and continue exercise :)  It was great to meet you today. I hope you have a wonderful, happy, safe, and healthy Holiday Season! See you in the New Year :)  Please continue to practice social distancing to keep you, your family, and our community safe.  If you must go out, please wear a mask and practice good handwashing.  It was a pleasure to see you and I look forward to continuing to work together on your health and well-being. Please do not hesitate to call the office if you need care or have questions about your care.  Have a wonderful day and week. With Gratitude, Cherly Beach, DNP, AGNP-BC

## 2019-08-23 NOTE — Progress Notes (Signed)
Subjective:  Patient ID: Nicole Good, female    DOB: 09-24-1940  Age: 78 y.o. MRN: [id number]  CC:  Chief Complaint  Patient presents with  . New Patient (Initial Visit)    establish care. patient says she isn't feeling herself. feels sleepy all the time even when she just gets up      HPI  New Pt to clinic, here to establish care.  Previous provider was at [facility].  Significant history of DVT/PE on Eliquis, arthritis, high cholesterol, high blood pressure, GERD, osteoporosis among others.  Only concern today outside of establishing care is that she feels like she is not having the energy that she needs the last month she has noticed that even after she sleeps a full night she wakes up tired she goes to bed tired.  She reports that she is very active during the day rearranging things organizing things cleaning her home she is very meticulous she reports about caring for things in her house.  She reports she works out 5 days a week 55 to 60 minutes at a time.  She reports that she would like to lose weight.  She denies having any alcohol or drug use.  Reports that she eats all major food groups.  Does not eat a lot of red meat does like to get her vegetables and fruits.  Reports that she drinks about 3 L of water daily.  She reports that she is up-to-date on her vaccines and immunizations as well as screening procedures.  Need to get records from previous office.   Past Medical History:  Diagnosis Date  . Arthritis    "maybe in my right knee and hip" (12/03/2015)  . DVT (deep venous thrombosis) ([place]) 12/03/2015   LLE  . Heart murmur    "when I was a little girl; gone now"  . High cholesterol   . Hypertension   . Osteoporosis   . Pulmonary emboli ([place]) ~ 2012    Family History  Problem Relation Age of Onset  . Stroke Mother   . Heart disease Mother   . Hypertension Maternal Grandmother     Current Meds  Medication Sig  . apixaban (ELIQUIS) 5 MG TABS tablet Take 5 mg by  mouth at bedtime.  . Cholecalciferol (VITAMIN D3) 1.25 MG (50000 UT) CAPS Take 1 capsule by mouth daily.  . hydrochlorothiazide (HYDRODIURIL) 25 MG tablet Take 25 mg by mouth every morning.   . Multiple Vitamins-Minerals (MULTIVITAMINS THER. W/MINERALS) TABS Take 1 tablet by mouth every morning.   . pantoprazole (PROTONIX) 40 MG tablet Take 40 mg by mouth daily.  . rosuvastatin (CRESTOR) 10 MG tablet Take 10 mg by mouth daily.    ROS:  Review of Systems  Constitutional: Negative.   HENT: Negative.   Eyes: Negative.   Respiratory: Negative.   Cardiovascular: Negative.   Gastrointestinal: Negative.   Genitourinary: Negative.   Musculoskeletal: Negative.   Skin: Negative.   Neurological: Negative.   Endo/Heme/Allergies: Negative.   Psychiatric/Behavioral: Negative.      Objective:   Today's Vitals: BP 122/78   Pulse 70   Temp 98.4 F (36.9 C) (Oral)   Resp 15   Ht 5\' 6"  (1.676 m)   Wt 182 lb (82.6 kg)   SpO2 99%   BMI 29.38 kg/m  Vitals with BMI 08/23/2019 11/16/2018 11/16/2018  Height 5\' 6"  - -  Weight 182 lbs - -  BMI 123XX123 - -  Systolic 123XX123 [id number]  Diastolic 78 91 [id number]  Pulse 70 63 65     Physical Exam Vitals and nursing note reviewed.  Constitutional:      Appearance: Normal appearance. She is well-developed, well-groomed and overweight.  HENT:     Head: Normocephalic and atraumatic.     Right Ear: External ear normal.     Left Ear: External ear normal.     Nose: Nose normal.     Mouth/Throat:     Mouth: Mucous membranes are moist.     Pharynx: Oropharynx is clear.  Eyes:     General:        Right eye: No discharge.        Left eye: No discharge.     Conjunctiva/sclera: Conjunctivae normal.  Cardiovascular:     Rate and Rhythm: Normal rate and regular rhythm.     Pulses: Normal pulses.     Heart sounds: Normal heart sounds.  Pulmonary:     Effort: Pulmonary effort is normal.     Breath sounds: Normal breath sounds.  Musculoskeletal:         General: Normal range of motion.     Cervical back: Normal range of motion and neck supple.  Skin:    General: Skin is warm.  Neurological:     General: No focal deficit present.     Mental Status: She is alert and oriented to person, place, and time.  Psychiatric:        Attention and Perception: Attention normal.        Mood and Affect: Mood normal.        Speech: Speech normal.        Behavior: Behavior normal. Behavior is cooperative.        Thought Content: Thought content normal.        Cognition and Memory: Cognition normal.        Judgment: Judgment normal.     Assessment   1. Essential hypertension   2. Hyperlipidemia, unspecified hyperlipidemia type   3. Other fatigue   4. Overweight (BMI 25.0-29.9)   5. Stage 3b chronic kidney disease   6. Barrett's esophagus without dysplasia   7. Impaired fasting blood sugar   8. Vitamin D deficiency   9. Other pulmonary embolism without acute cor pulmonale, unspecified chronicity ([place])     Tests ordered Orders Placed This Encounter  Procedures  . COMPLETE METABOLIC PANEL WITH GFR  . CBC with Differential/Platelet  . Hemoglobin A1c  . Lipid panel  . TSH  . VITAMIN D 25 Hydroxy   Plan: Please see assessment and plan per problem list below.   No orders of the defined types were placed in this encounter.   Patient to follow-up in 1 month .  Perlie Mayo, NP

## 2019-08-23 NOTE — Assessment & Plan Note (Signed)
Strongly encouraged to make sure that she maintains activity level as she is reported.  In addition to making sure that she eats a heart healthy, low-carb, low-fat diet.

## 2019-08-23 NOTE — Assessment & Plan Note (Signed)
On Eliquis, continue tolerating well.  No signs or symptoms of bleeding

## 2019-08-23 NOTE — Assessment & Plan Note (Signed)
Controlled continue current medication at this time.  Strongly encouraged to make sure she maintains a DASH diet and continue exercise.

## 2019-08-27 LAB — CBC WITH DIFFERENTIAL/PLATELET
Absolute Monocytes: 426 cells/uL (ref 200–950)
Basophils Absolute: 39 cells/uL (ref 0–200)
Basophils Relative: 0.7 %
Eosinophils Absolute: 280 cells/uL (ref 15–500)
Eosinophils Relative: 5 %
HCT: 40.5 % (ref 35.0–45.0)
Hemoglobin: 13.4 g/dL (ref 11.7–15.5)
Lymphs Abs: 1310 cells/uL (ref 850–3900)
MCH: 29.8 pg (ref 27.0–33.0)
MCHC: 33.1 g/dL (ref 32.0–36.0)
MCV: 90 fL (ref 80.0–100.0)
MPV: 11.2 fL (ref 7.5–12.5)
Monocytes Relative: 7.6 %
Neutro Abs: 3545 cells/uL (ref 1500–7800)
Neutrophils Relative %: 63.3 %
Platelets: 204 10*3/uL (ref 140–400)
RBC: 4.5 10*6/uL (ref 3.80–5.10)
RDW: 13.1 % (ref 11.0–15.0)
Total Lymphocyte: 23.4 %
WBC: 5.6 10*3/uL (ref 3.8–10.8)

## 2019-08-27 LAB — COMPLETE METABOLIC PANEL WITH GFR
AG Ratio: 1.5 (calc) (ref 1.0–2.5)
ALT: 20 U/L (ref 6–29)
AST: 23 U/L (ref 10–35)
Albumin: 4.5 g/dL (ref 3.6–5.1)
Alkaline phosphatase (APISO): 62 U/L (ref 37–153)
BUN/Creatinine Ratio: 16 (calc) (ref 6–22)
BUN: 25 mg/dL (ref 7–25)
CO2: 26 mmol/L (ref 20–32)
Calcium: 10.2 mg/dL (ref 8.6–10.4)
Chloride: 106 mmol/L (ref 98–110)
Creat: 1.58 mg/dL — ABNORMAL HIGH (ref 0.60–0.93)
GFR, Est African American: 36 mL/min/{1.73_m2} — ABNORMAL LOW (ref >=60)
GFR, Est Non African American: 31 mL/min/{1.73_m2} — ABNORMAL LOW (ref >=60)
Globulin: 3 g/dL (calc) (ref 1.9–3.7)
Glucose, Bld: 94 mg/dL (ref 65–99)
Potassium: 4.6 mmol/L (ref 3.5–5.3)
Sodium: 142 mmol/L (ref 135–146)
Total Bilirubin: 0.4 mg/dL (ref 0.2–1.2)
Total Protein: 7.5 g/dL (ref 6.1–8.1)

## 2019-08-27 LAB — LIPID PANEL
Cholesterol: 175 mg/dL (ref <200)
HDL: 32 mg/dL — ABNORMAL LOW (ref >=50)
LDL Cholesterol (Calc): 104 mg/dL (calc) — ABNORMAL HIGH
Non-HDL Cholesterol (Calc): 143 mg/dL (calc) — ABNORMAL HIGH (ref <130)
Total CHOL/HDL Ratio: 5.5 (calc) — ABNORMAL HIGH (ref <5.0)
Triglycerides: 276 mg/dL — ABNORMAL HIGH (ref <150)

## 2019-08-27 LAB — VITAMIN D 25 HYDROXY (VIT D DEFICIENCY, FRACTURES): Vit D, 25-Hydroxy: 48 ng/mL (ref 30–100)

## 2019-08-27 LAB — HEMOGLOBIN A1C
Hgb A1c MFr Bld: 6.1 % of total Hgb — ABNORMAL HIGH (ref <5.7)
Mean Plasma Glucose: 128 (calc)
eAG (mmol/L): 7.1 (calc)

## 2019-08-27 LAB — TSH: TSH: 0.66 mIU/L (ref 0.40–4.50)

## 2019-08-29 ENCOUNTER — Other Ambulatory Visit: Payer: Self-pay | Admitting: Family Medicine

## 2019-08-29 DIAGNOSIS — E781 Pure hyperglyceridemia: Secondary | ICD-10-CM

## 2019-08-29 MED ORDER — OMEGA-3-ACID ETHYL ESTERS 1 G PO CAPS: 1.0000 g | ORAL_CAPSULE | Freq: Two times a day (BID) | ORAL | 1 refills | Status: DC

## 2019-09-15 ENCOUNTER — Other Ambulatory Visit: Payer: Self-pay | Admitting: Family Medicine

## 2019-09-15 DIAGNOSIS — E781 Pure hyperglyceridemia: Secondary | ICD-10-CM

## 2019-09-15 MED ORDER — ICOSAPENT ETHYL 1 G PO CAPS: 2.0000 g | ORAL_CAPSULE | Freq: Two times a day (BID) | ORAL | 1 refills | Status: DC

## 2019-10-12 ENCOUNTER — Other Ambulatory Visit: Payer: Self-pay

## 2019-10-12 DIAGNOSIS — E781 Pure hyperglyceridemia: Secondary | ICD-10-CM

## 2019-10-12 MED ORDER — ICOSAPENT ETHYL 1 G PO CAPS: 2.0000 g | ORAL_CAPSULE | Freq: Two times a day (BID) | ORAL | 1 refills | Status: DC

## 2019-11-24 ENCOUNTER — Other Ambulatory Visit: Payer: Self-pay

## 2019-11-24 ENCOUNTER — Ambulatory Visit (INDEPENDENT_AMBULATORY_CARE_PROVIDER_SITE_OTHER): Payer: Medicare Other | Admitting: Family Medicine

## 2019-11-24 ENCOUNTER — Encounter: Payer: Self-pay | Admitting: Family Medicine

## 2019-11-24 VITALS — BP 130/84 | HR 60 | Temp 97.4°F | Resp 16 | Ht 66.0 in | Wt 180.0 lb

## 2019-11-24 DIAGNOSIS — E781 Pure hyperglyceridemia: Secondary | ICD-10-CM | POA: Diagnosis not present

## 2019-11-24 DIAGNOSIS — E663 Overweight: Secondary | ICD-10-CM | POA: Diagnosis not present

## 2019-11-24 DIAGNOSIS — I10 Essential (primary) hypertension: Secondary | ICD-10-CM

## 2019-11-24 DIAGNOSIS — R7303 Prediabetes: Secondary | ICD-10-CM

## 2019-11-24 DIAGNOSIS — E785 Hyperlipidemia, unspecified: Secondary | ICD-10-CM

## 2019-11-24 DIAGNOSIS — N1832 Chronic kidney disease, stage 3b: Secondary | ICD-10-CM

## 2019-11-24 DIAGNOSIS — Z780 Asymptomatic menopausal state: Secondary | ICD-10-CM

## 2019-11-24 MED ORDER — ICOSAPENT ETHYL 1 G PO CAPS: 2.0000 g | ORAL_CAPSULE | Freq: Two times a day (BID) | ORAL | 1 refills | Status: DC

## 2019-11-24 MED ORDER — LISINOPRIL-HYDROCHLOROTHIAZIDE 20-25 MG PO TABS: 1.0000 | ORAL_TABLET | Freq: Every day | ORAL | 3 refills | Status: DC

## 2019-11-24 NOTE — Assessment & Plan Note (Signed)
Nicole Good is encouraged to maintain a well balanced diet that is low in salt. Controlled, continue current medication regimen. Refills provided.  Additionally, she is also reminded that exercise is beneficial for heart health and control of  Blood pressure. 30-60 minutes daily is recommended-walking was suggested.

## 2019-11-24 NOTE — Assessment & Plan Note (Signed)
Bone scan ordered today.

## 2019-11-24 NOTE — Assessment & Plan Note (Signed)
Getting updated labs. Avoid NSAID and alike medications

## 2019-11-24 NOTE — Assessment & Plan Note (Signed)
Refill of vascepa provided Encouraged low fat diet

## 2019-11-24 NOTE — Assessment & Plan Note (Signed)
Up dated lab ordered

## 2019-11-24 NOTE — Patient Instructions (Signed)
I appreciate the opportunity to provide you with care for your health and wellness. Today we discussed: overall health  Follow up: 3 months   Labs fasting in the next day to week  So glad you are taking good care of yourself !!! Continue to do so, and enjoy the less healthier foods in moderation.   Enjoy to Spring!   Please continue to practice social distancing to keep you, your family, and our community safe.  If you must go out, please wear a mask and practice good handwashing.  It was a pleasure to see you and I look forward to continuing to work together on your health and well-being. Please do not hesitate to call the office if you need care or have questions about your care.  Have a wonderful day and week. With Gratitude, Cherly Beach, DNP, AGNP-BC

## 2019-11-24 NOTE — Progress Notes (Signed)
Subjective:  Patient ID: Nicole Good, female    DOB: [birth year]  Age: 79 y.o. MRN: [id number]  CC:  Chief Complaint  Patient presents with  . Hypertension    follow up visit       HPI  HPI  Nicole Good is a 79 year old female patient who presents today for follow-up visit on blood pressure. She reports that she has been exercising an hour at least 5 days a week.  Has been trying to stick to a low-salt diet.  Did enjoy some cube steak and gravy last night, but otherwise she reports working hard to be healthy.  She reports taking all medications without any issue.  She was only listed as having hydrochlorothiazide on her medication list but she supposed be on the combination pill of hydrochlorothiazide and lisinopril.  This will be changed and adjusted today.  She denies having any chest pain shortness of breath leg swelling headaches dizziness or vision changes or any other signs or symptoms of uncontrolled blood pressure at this time. Not fasting today but is able to get labs tomorrow next week.  Additionally reports that she has had both COVID vaccines. Denies having any other issues or concerns today.  Today patient denies signs and symptoms of COVID 19 infection including fever, chills, cough, shortness of breath, and headache. Past Medical, Surgical, Social History, Allergies, and Medications have been Reviewed.   Past Medical History:  Diagnosis Date  . Arthritis    "maybe in my right knee and hip" (12/03/2015)  . DVT (deep venous thrombosis) ([place]) 12/03/2015   LLE  . Heart murmur    "when I was a little girl; gone now"  . High cholesterol   . Hypertension   . Osteoporosis   . Prolapsed internal hemorrhoids, grade 3 07/04/2014  . Pulmonary emboli ([place]) ~ 2012  . Right arm weakness 10/30/2015    Current Meds  Medication Sig  . apixaban (ELIQUIS) 5 MG TABS tablet Take 5 mg by mouth at bedtime.  . Cholecalciferol (VITAMIN D3) 1.25 MG (50000 UT) CAPS Take 1  capsule by mouth daily.  . hydrochlorothiazide (HYDRODIURIL) 25 MG tablet Take 25 mg by mouth every morning.   Marland Kitchen icosapent Ethyl (VASCEPA) 1 g capsule Take 2 capsules (2 g total) by mouth 2 (two) times daily.  . Multiple Vitamins-Minerals (MULTIVITAMINS THER. W/MINERALS) TABS Take 1 tablet by mouth every morning.   . pantoprazole (PROTONIX) 40 MG tablet Take 40 mg by mouth daily.  . rosuvastatin (CRESTOR) 10 MG tablet Take 10 mg by mouth daily.  . [DISCONTINUED] icosapent Ethyl (VASCEPA) 1 g capsule Take 2 capsules (2 g total) by mouth 2 (two) times daily.    ROS:  Review of Systems  Constitutional: Negative.   HENT: Negative.   Eyes: Negative.   Respiratory: Negative.   Cardiovascular: Negative.   Gastrointestinal: Negative.   Genitourinary: Negative.   Musculoskeletal: Negative.   Skin: Negative.   Neurological: Negative.   Endo/Heme/Allergies: Negative.   Psychiatric/Behavioral: Negative.   All other systems reviewed and are negative.    Objective:   Today's Vitals: BP 130/84   Pulse 60   Temp (!) 97.4 F (36.3 C) (Temporal)   Resp 16   Ht 5\' 6"  (1.676 m)   Wt 180 lb (81.6 kg)   SpO2 99%   BMI 29.05 kg/m  Vitals with BMI 11/24/2019 08/23/2019 11/16/2018  Height 5\' 6"  5\' 6"  -  Weight 180 lbs 182 lbs -  BMI 29.07  123XX123 -  Systolic [id number] 123456  Diastolic 84 78 91  Pulse 60 70 63     Physical Exam Vitals and nursing note reviewed.  Constitutional:      Appearance: Normal appearance. She is well-developed, well-groomed and overweight.  HENT:     Head: Normocephalic and atraumatic.     Right Ear: External ear normal.     Left Ear: External ear normal.     Mouth/Throat:     Comments: Mask in place  Eyes:     General:        Right eye: No discharge.        Left eye: No discharge.     Conjunctiva/sclera: Conjunctivae normal.  Cardiovascular:     Rate and Rhythm: Normal rate and regular rhythm.     Pulses: Normal pulses.     Heart sounds: Normal heart sounds.    Pulmonary:     Effort: Pulmonary effort is normal.     Breath sounds: Normal breath sounds.  Musculoskeletal:        General: Normal range of motion.     Cervical back: Normal range of motion and neck supple.  Skin:    General: Skin is warm.  Neurological:     General: No focal deficit present.     Mental Status: She is alert and oriented to person, place, and time.  Psychiatric:        Attention and Perception: Attention normal.        Mood and Affect: Mood normal.        Speech: Speech normal.        Behavior: Behavior normal. Behavior is cooperative.        Thought Content: Thought content normal.        Cognition and Memory: Cognition normal.        Judgment: Judgment normal.     Assessment   1. Essential hypertension   2. Hyperlipidemia, unspecified hyperlipidemia type   3. Hypertriglyceridemia   4. Overweight (BMI 25.0-29.9)   5. Prediabetes   6. Post-menopausal   7. Stage 3b chronic kidney disease     Tests ordered Orders Placed This Encounter  Procedures  . DG Bone Density  . CBC  . COMPLETE METABOLIC PANEL WITH GFR  . Hemoglobin A1c  . Lipid panel     Plan: Please see assessment and plan per problem list above.   Meds ordered this encounter  Medications  . lisinopril-hydrochlorothiazide (ZESTORETIC) 20-25 MG tablet    Sig: Take 1 tablet by mouth daily.    Dispense:  90 tablet    Refill:  3    Order Specific Question:   Supervising Provider    Answer:   SIMPSON, MARGARET E [id number]  . icosapent Ethyl (VASCEPA) 1 g capsule    Sig: Take 2 capsules (2 g total) by mouth 2 (two) times daily.    Dispense:  120 capsule    Refill:  1    Order Specific Question:   Supervising Provider    Answer:   Fayrene Helper [[id number]]   30 mins of face to face time  Patient to follow-up in 02/29/2020.   Perlie Mayo, NP

## 2019-11-24 NOTE — Assessment & Plan Note (Signed)
Improved Encourage to continue lifestyle changes

## 2019-11-29 ENCOUNTER — Ambulatory Visit (HOSPITAL_COMMUNITY)
Admission: RE | Admit: 2019-11-29 | Discharge: 2019-11-29 | Disposition: A | Discharge status: Home / Self Care | Payer: Medicare Other | Source: Ambulatory Visit | Attending: Family Medicine | Admitting: Family Medicine

## 2019-11-29 ENCOUNTER — Other Ambulatory Visit: Payer: Self-pay

## 2019-11-29 DIAGNOSIS — Z780 Asymptomatic menopausal state: Secondary | ICD-10-CM | POA: Insufficient documentation

## 2019-11-30 ENCOUNTER — Other Ambulatory Visit: Payer: Self-pay | Admitting: Family Medicine

## 2019-11-30 DIAGNOSIS — M810 Age-related osteoporosis without current pathological fracture: Secondary | ICD-10-CM

## 2019-11-30 LAB — LIPID PANEL
Cholesterol: 174 mg/dL (ref <200)
HDL: 36 mg/dL — ABNORMAL LOW (ref >=50)
LDL Cholesterol (Calc): 111 mg/dL (calc) — ABNORMAL HIGH
Non-HDL Cholesterol (Calc): 138 mg/dL (calc) — ABNORMAL HIGH (ref <130)
Total CHOL/HDL Ratio: 4.8 (calc) (ref <5.0)
Triglycerides: 159 mg/dL — ABNORMAL HIGH (ref <150)

## 2019-11-30 LAB — COMPLETE METABOLIC PANEL WITH GFR
AG Ratio: 1.5 (calc) (ref 1.0–2.5)
ALT: 19 U/L (ref 6–29)
AST: 19 U/L (ref 10–35)
Albumin: 4.2 g/dL (ref 3.6–5.1)
Alkaline phosphatase (APISO): 54 U/L (ref 37–153)
BUN/Creatinine Ratio: 19 (calc) (ref 6–22)
BUN: 26 mg/dL — ABNORMAL HIGH (ref 7–25)
CO2: 27 mmol/L (ref 20–32)
Calcium: 9.5 mg/dL (ref 8.6–10.4)
Chloride: 105 mmol/L (ref 98–110)
Creat: 1.36 mg/dL — ABNORMAL HIGH (ref 0.60–0.93)
GFR, Est African American: 43 mL/min/{1.73_m2} — ABNORMAL LOW (ref >=60)
GFR, Est Non African American: 37 mL/min/{1.73_m2} — ABNORMAL LOW (ref >=60)
Globulin: 2.8 g/dL (calc) (ref 1.9–3.7)
Glucose, Bld: 80 mg/dL (ref 65–99)
Potassium: 4.7 mmol/L (ref 3.5–5.3)
Sodium: 140 mmol/L (ref 135–146)
Total Bilirubin: 0.3 mg/dL (ref 0.2–1.2)
Total Protein: 7 g/dL (ref 6.1–8.1)

## 2019-11-30 LAB — CBC
HCT: 37.8 % (ref 35.0–45.0)
Hemoglobin: 12.4 g/dL (ref 11.7–15.5)
MCH: 29.5 pg (ref 27.0–33.0)
MCHC: 32.8 g/dL (ref 32.0–36.0)
MCV: 90 fL (ref 80.0–100.0)
MPV: 10.7 fL (ref 7.5–12.5)
Platelets: 186 10*3/uL (ref 140–400)
RBC: 4.2 10*6/uL (ref 3.80–5.10)
RDW: 13.1 % (ref 11.0–15.0)
WBC: 4.8 10*3/uL (ref 3.8–10.8)

## 2019-11-30 LAB — HEMOGLOBIN A1C
Hgb A1c MFr Bld: 6.1 % of total Hgb — ABNORMAL HIGH (ref <5.7)
Mean Plasma Glucose: 128 (calc)
eAG (mmol/L): 7.1 (calc)

## 2019-11-30 MED ORDER — ALENDRONATE SODIUM 35 MG PO TABS: 35.0000 mg | ORAL_TABLET | ORAL | 3 refills | Status: DC

## 2020-01-09 ENCOUNTER — Telehealth: Payer: Self-pay

## 2020-01-09 NOTE — Telephone Encounter (Signed)
PT lvm that the medication she was given is only making her cough, she wants a call back from nurse

## 2020-01-09 NOTE — Telephone Encounter (Signed)
LVM for pt to call the office.

## 2020-01-10 ENCOUNTER — Telehealth: Payer: Self-pay

## 2020-01-10 NOTE — Telephone Encounter (Signed)
Pt is returning a call.  Pt also has a question regarding medication for Blood pressure, pt is coughing from the medication

## 2020-01-11 NOTE — Telephone Encounter (Signed)
Patient states the lisinopril is making her cough and its getting worse. She doesn't want to take it anymore and wants it changed to something else. I told her I would call her back after lunch.

## 2020-01-11 NOTE — Telephone Encounter (Signed)
LVM for pt per request with recommendations

## 2020-01-11 NOTE — Telephone Encounter (Signed)
We can stop the lisinopril and I will review notes and order a new medication.

## 2020-02-14 ENCOUNTER — Encounter: Payer: Self-pay | Admitting: Family Medicine

## 2020-02-14 ENCOUNTER — Other Ambulatory Visit: Payer: Self-pay

## 2020-02-14 ENCOUNTER — Ambulatory Visit (INDEPENDENT_AMBULATORY_CARE_PROVIDER_SITE_OTHER): Payer: Medicare Other | Admitting: Family Medicine

## 2020-02-14 VITALS — BP 118/83 | HR 83 | Temp 97.8°F | Ht 65.5 in | Wt 167.4 lb

## 2020-02-14 DIAGNOSIS — N1832 Chronic kidney disease, stage 3b: Secondary | ICD-10-CM

## 2020-02-14 DIAGNOSIS — I10 Essential (primary) hypertension: Secondary | ICD-10-CM

## 2020-02-14 DIAGNOSIS — Z7901 Long term (current) use of anticoagulants: Secondary | ICD-10-CM

## 2020-02-14 DIAGNOSIS — R5383 Other fatigue: Secondary | ICD-10-CM | POA: Diagnosis not present

## 2020-02-14 DIAGNOSIS — I493 Ventricular premature depolarization: Secondary | ICD-10-CM

## 2020-02-14 DIAGNOSIS — F320 Major depressive disorder, single episode, mild: Secondary | ICD-10-CM

## 2020-02-14 DIAGNOSIS — R9431 Abnormal electrocardiogram [ECG] [EKG]: Secondary | ICD-10-CM

## 2020-02-14 NOTE — Progress Notes (Signed)
Subjective:  Patient ID: Nicole Good, female    DOB: [birth year]  Age: 78 y.o. MRN: [id number]  CC:  Chief Complaint  Patient presents with  . Acute Visit      HPI  HPI Nicole Good is an 79 year old female patient of mine.  She presents today after having the last 4 to 5 days feeling bad.  Denies having aches or pains.  Just reports no energy feeling very sleepy and lethargic and tired.  She reports that she just wears out very easily.  She gets extremely tired after just vacuuming half the floor.  She denies having any changes with her ability to sleep.  She reports changes in her appetite not eating or drinking very well.  Husband is very concerned about this. She appears down in conversation.  She is on a blood thinner.  Denies having any abnormal bleeding episodes in stool, urine or by nose.  Take your medications as directed.  Irritability get updated labs today.  She denies having any chest pain today in office.  Denies having any palpitations, leg swelling, cough or shortness of breath.  Reports taking all her medications as directed.  Today patient denies signs and symptoms of COVID 19 infection including fever, chills, cough, shortness of breath, and headache. Past Medical, Surgical, Social History, Allergies, and Medications have been Reviewed.   Past Medical History:  Diagnosis Date  . Arthritis    "maybe in my right knee and hip" (12/03/2015)  . DVT (deep venous thrombosis) ([place]) 12/03/2015   LLE  . Heart murmur    "when I was a little girl; gone now"  . High cholesterol   . Hypertension   . Osteoporosis   . Prolapsed internal hemorrhoids, grade 3 07/04/2014  . Pulmonary emboli ([place]) ~ 2012  . Right arm weakness 10/30/2015    Current Meds  Medication Sig  . Cholecalciferol (VITAMIN D3) 1.25 MG (50000 UT) CAPS Take 1 capsule by mouth daily.  . Multiple Vitamins-Minerals (MULTIVITAMINS THER. W/MINERALS) TABS Take 1 tablet by mouth every morning.   .  pantoprazole (PROTONIX) 40 MG tablet Take 40 mg by mouth daily.  . rosuvastatin (CRESTOR) 10 MG tablet Take 10 mg by mouth daily.  . [DISCONTINUED] apixaban (ELIQUIS) 5 MG TABS tablet Take 5 mg by mouth at bedtime.  . [DISCONTINUED] lisinopril-hydrochlorothiazide (ZESTORETIC) 20-25 MG tablet Take 1 tablet by mouth daily.    ROS:  Review of Systems  Constitutional: Positive for malaise/fatigue.  HENT: Negative.   Eyes: Negative.   Respiratory: Negative.   Cardiovascular: Negative.   Gastrointestinal: Negative.   Genitourinary: Negative.   Musculoskeletal: Negative.   Skin: Negative.   Neurological: Negative.   Endo/Heme/Allergies: Negative.   Psychiatric/Behavioral: Positive for depression.  All other systems reviewed and are negative.    Objective:   Today's Vitals: BP 118/83 (BP Location: Right Arm, Patient Position: Sitting, Cuff Size: Normal)   Pulse 83   Temp 97.8 F (36.6 C) (Temporal)   Ht 5' 5.5" (1.664 m)   Wt 167 lb 6.4 oz (75.9 kg)   SpO2 98%   BMI 27.43 kg/m  Vitals with BMI 02/14/2020 11/24/2019 08/23/2019  Height 5' 5.5" 5\' 6"  5\' 6"   Weight 167 lbs 6 oz 180 lbs 182 lbs  BMI 27.42 17.61 60.73  Systolic 710 626 948  Diastolic 83 84 78  Pulse 83 60 70     Physical Exam Vitals and nursing note reviewed.  Constitutional:      Appearance:  Normal appearance. She is well-developed, well-groomed and overweight.  HENT:     Head: Normocephalic and atraumatic.     Right Ear: External ear normal.     Left Ear: External ear normal.     Mouth/Throat:     Comments: Mask in place  Eyes:     General:        Right eye: No discharge.        Left eye: No discharge.     Conjunctiva/sclera: Conjunctivae normal.  Cardiovascular:     Rate and Rhythm: Normal rate and regular rhythm.     Pulses: Normal pulses.     Heart sounds: Normal heart sounds.  Pulmonary:     Effort: Pulmonary effort is normal.     Breath sounds: Normal breath sounds.  Musculoskeletal:         General: Normal range of motion.     Cervical back: Normal range of motion and neck supple.  Skin:    General: Skin is warm.  Neurological:     General: No focal deficit present.     Mental Status: She is alert and oriented to person, place, and time.  Psychiatric:        Attention and Perception: Attention normal.        Mood and Affect: Mood is depressed.        Speech: Speech normal.        Behavior: Behavior normal. Behavior is cooperative.        Thought Content: Thought content normal.        Cognition and Memory: Cognition normal.        Judgment: Judgment normal.    EKG in office personally reviewed by me.  Normal sinus rhythm, 64 bpm.  Possible left atrial enlargement, left ventricular hypertrophy, nonspecific T wave abnormality.  Abnormal EKG.    Depression screen [facility] 2/9 02/14/2020 08/23/2019  Decreased Interest 0 0  Down, Depressed, Hopeless 0 0  PHQ - 2 Score 0 0  Altered sleeping 0 -  Tired, decreased energy 3 -  Change in appetite 3 -  Feeling bad or failure about yourself  0 -  Trouble concentrating 0 -  Moving slowly or fidgety/restless 0 -  Suicidal thoughts 0 -  PHQ-9 Score 6 -  Difficult doing work/chores Somewhat difficult -     Assessment   1. Current mild episode of major depressive disorder without prior episode ([place])   2. Essential hypertension   3. Fatigue, unspecified type   4. Stage 3b chronic kidney disease   5. Anticoagulant long-term use   6. Abnormal EKG   7. PVC's (premature ventricular contractions)     Tests ordered Orders Placed This Encounter  Procedures  . CBC  . COMPLETE METABOLIC PANEL WITH GFR  . VITAMIN D 25 Hydroxy (Vit-D Deficiency, Fractures)  . TSH  . Magnesium  . Magnesium  . Ambulatory referral to Cardiology     Plan: Please see assessment and plan per problem list above.   No orders of the defined types were placed in this encounter.   Patient to follow-up in 4 weeks   Perlie Mayo, NP

## 2020-02-14 NOTE — Patient Instructions (Addendum)
I appreciate the opportunity to provide you with care for your health and wellness. Today we discussed: fatigue   Follow up: 4-6 weeks  Labs today Referral to Cardiology-Heart Dr to follow up on EKG changes  Please increase your fluid intake and try to eat a balanced diet. We will be back in touch once your labs as resulted  Please continue to practice social distancing to keep you, your family, and our community safe.  If you must go out, please wear a mask and practice good handwashing.  It was a pleasure to see you and I look forward to continuing to work together on your health and well-being. Please do not hesitate to call the office if you need care or have questions about your care.  Have a wonderful day and week. With Gratitude, Cherly Beach, DNP, AGNP-BC

## 2020-02-15 ENCOUNTER — Other Ambulatory Visit: Payer: Self-pay | Admitting: Family Medicine

## 2020-02-15 DIAGNOSIS — M810 Age-related osteoporosis without current pathological fracture: Secondary | ICD-10-CM

## 2020-02-15 DIAGNOSIS — Z86718 Personal history of other venous thrombosis and embolism: Secondary | ICD-10-CM

## 2020-02-15 DIAGNOSIS — Z7901 Long term (current) use of anticoagulants: Secondary | ICD-10-CM

## 2020-02-15 LAB — COMPLETE METABOLIC PANEL WITH GFR
AG Ratio: 1.6 (calc) (ref 1.0–2.5)
ALT: 20 U/L (ref 6–29)
AST: 17 U/L (ref 10–35)
Albumin: 4.6 g/dL (ref 3.6–5.1)
Alkaline phosphatase (APISO): 57 U/L (ref 37–153)
BUN/Creatinine Ratio: 19 (calc) (ref 6–22)
BUN: 32 mg/dL — ABNORMAL HIGH (ref 7–25)
CO2: 28 mmol/L (ref 20–32)
Calcium: 10.5 mg/dL — ABNORMAL HIGH (ref 8.6–10.4)
Chloride: 101 mmol/L (ref 98–110)
Creat: 1.66 mg/dL — ABNORMAL HIGH (ref 0.60–0.88)
GFR, Est African American: 33 mL/min/{1.73_m2} — ABNORMAL LOW (ref >=60)
GFR, Est Non African American: 29 mL/min/{1.73_m2} — ABNORMAL LOW (ref >=60)
Globulin: 2.8 g/dL (calc) (ref 1.9–3.7)
Glucose, Bld: 91 mg/dL (ref 65–99)
Potassium: 4.7 mmol/L (ref 3.5–5.3)
Sodium: 140 mmol/L (ref 135–146)
Total Bilirubin: 0.3 mg/dL (ref 0.2–1.2)
Total Protein: 7.4 g/dL (ref 6.1–8.1)

## 2020-02-15 LAB — CBC
HCT: 41.3 % (ref 35.0–45.0)
Hemoglobin: 13.7 g/dL (ref 11.7–15.5)
MCH: 29.7 pg (ref 27.0–33.0)
MCHC: 33.2 g/dL (ref 32.0–36.0)
MCV: 89.6 fL (ref 80.0–100.0)
MPV: 10.7 fL (ref 7.5–12.5)
Platelets: 221 10*3/uL (ref 140–400)
RBC: 4.61 10*6/uL (ref 3.80–5.10)
RDW: 13.1 % (ref 11.0–15.0)
WBC: 6.5 10*3/uL (ref 3.8–10.8)

## 2020-02-15 LAB — VITAMIN D 25 HYDROXY (VIT D DEFICIENCY, FRACTURES): Vit D, 25-Hydroxy: 54 ng/mL (ref 30–100)

## 2020-02-15 LAB — MAGNESIUM: Magnesium: 2.2 mg/dL (ref 1.5–2.5)

## 2020-02-15 LAB — TSH: TSH: 0.46 mIU/L (ref 0.40–4.50)

## 2020-02-15 MED ORDER — CALCIUM 500 MG PO TABS: 500.0000 mg | ORAL_TABLET | Freq: Three times a day (TID) | ORAL | 3 refills | Status: DC

## 2020-02-15 MED ORDER — APIXABAN 2.5 MG PO TABS: 2.5000 mg | ORAL_TABLET | Freq: Two times a day (BID) | ORAL | 3 refills | Status: DC

## 2020-02-15 NOTE — Progress Notes (Signed)
CARDIOLOGY CONSULT NOTE       Patient ID: Nicole Good MRN: [id number] DOB/AGE: 80/80/1942 80 y.o.  Admit date: (Not on file) Referring Physician: Jerelene Redden Primary Physician: Perlie Mayo, NP Primary Cardiologist: New Reason for Consultation: PVC/Abnormal ECG  Active Problems:   * No active hospital problems. *   HPI:  79 y.o. referred by Joni Reining NP for abnormal ECG and PVC. 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 Arthritis    "maybe in my right knee and hip" (12/03/2015)  . DVT (deep venous thrombosis) ([place]) 12/03/2015   LLE  . Heart murmur    "when I was a little girl; gone now"  . High cholesterol   . Hypertension   . Osteoporosis   . Prolapsed internal hemorrhoids, grade 3 07/04/2014  . Pulmonary emboli ([place]) ~ 2012  . Right arm weakness 10/30/2015    Family History  Problem Relation Age of Onset  . Stroke Mother   . Heart disease Mother   . Hypertension Maternal Grandmother     Social History   Socioeconomic History  . Marital status: Married    Spouse name: Not on file  . Number of children: 1  . Years of education: MA  . Highest education level: Not on file  Occupational History  . Occupation: retired    Comment:  middle school principle   Tobacco Use  . Smoking status: Never Smoker  . Smokeless tobacco: Never Used  Substance and Sexual Activity  . Alcohol use: No  . Drug use: No  . Sexual activity: Never  Other Topics Concern  . Not on file  Social History Narrative   Lives with husband-married 67 years       1 son- no grandkids    Right handed       Enjoys: reads, cleaned and organized, piano       Diet: eats all foods groups and tries to balanced diet well-occasionally hot dog and red meat   Caffeine: hot chocolate, green tea   Water: 3 liters      Exercise 55 minutes 5 days of week       Wears seat belt   Smoke detectors at home   Does not use phone while driving   Social Determinants of Health   Financial Resource Strain: Low Risk   . Difficulty of Paying Living Expenses: Not hard at all  Food Insecurity: No Food Insecurity  . Worried About Charity fundraiser in the Last Year: Never true  . Ran Out of Food in the Last Year: Never true  Transportation Needs: No Transportation Needs  . Lack of Transportation (Medical): No  .  Lack of Transportation (Non-Medical): No  Physical Activity: Sufficiently Active  . Days of Exercise per Week: 5 days  . Minutes of Exercise per Session: 50 min  Stress: No Stress Concern Present  . Feeling of Stress : Not at all  Social Connections: Moderately Integrated  . Frequency of Communication with Friends and Family: More than three times a week  . Frequency of Social Gatherings with Friends and Family: More than three times a week  . Attends Religious Services: More than 4 times per year  . Active Member of Clubs or Organizations: No  . Attends Archivist Meetings: Never  . Marital Status: Married  Human resources officer Violence: Not At Risk  . Fear of Current or Ex-Partner: No  . Emotionally Abused: No  . Physically Abused: No  . Sexually Abused: No    Past Surgical History:  Procedure Laterality Date  . ABDOMINAL HYSTERECTOMY   ~ 1979   "partial"  . CATARACT EXTRACTION W/ INTRAOCULAR LENS  IMPLANT, BILATERAL Bilateral 2017  . DILATION AND CURETTAGE OF UTERUS    . TONSILLECTOMY  1950      Current Outpatient Medications:  .  apixaban (ELIQUIS) 2.5 MG TABS tablet, Take 1 tablet (2.5 mg total) by mouth 2 (two) times daily., Disp: 60 tablet, Rfl: 3 .  Calcium 500 MG tablet, Take 1 tablet (500 mg total) by mouth 3 (three) times daily with meals., Disp: 90 tablet, Rfl: 3 .  Cholecalciferol (VITAMIN D3) 1.25 MG (50000 UT) CAPS, Take 1 capsule by mouth daily., Disp: , Rfl:  .  hydrochlorothiazide (HYDRODIURIL) 25 MG tablet, Take 25 mg by mouth daily., Disp: , Rfl:  .  lisinopril (ZESTRIL) 10 MG tablet, Take 10 mg by mouth daily., Disp: , Rfl:  .  Multiple Vitamins-Minerals (MULTIVITAMINS THER. W/MINERALS) TABS, Take 1 tablet by mouth every morning. , Disp: , Rfl:  .  omega-3 acid ethyl esters (LOVAZA) 1 g capsule, Take 1 capsule by mouth 2 (two) times daily., Disp: , Rfl:  .  pantoprazole (PROTONIX) 40 MG tablet, Take 40 mg by mouth daily., Disp: , Rfl:  .  rosuvastatin (CRESTOR) 10 MG tablet, Take 10 mg by mouth daily., Disp: , Rfl:     Physical Exam: Blood pressure 130/80, pulse 68, height 5' 5.5" (1.664 m), weight 178 lb (80.7 kg), SpO2 97 %.  Affect appropriate Healthy:  appears stated age 79: Chronic right Ptosis  Neck supple with no adenopathy JVP normal no bruits no thyromegaly Lungs clear with no wheezing and good diaphragmatic motion Heart:  S1/S2 no murmur, no rub, gallop or click PMI normal Abdomen: benighn, BS positve, no tenderness, no AAA no bruit.  No HSM or HJR Distal pulses intact with no bruits No edema Neuro non-focal Skin warm and dry No muscular weakness  Labs:   Lab Results  Component Value Date   WBC 6.5 02/14/2020   HGB 13.7 02/14/2020   HCT 41.3 02/14/2020   MCV 89.6 02/14/2020   PLT 221 02/14/2020    No results for input(s): NA, K, CL, CO2, BUN, CREATININE, CALCIUM,  PROT, BILITOT, ALKPHOS, ALT, AST, GLUCOSE in the last 168 hours.  Invalid input(s): LABALBU Lab Results  Component Value Date   TROPONINI <0.30 09/23/2012    Lab Results  Component Value Date   CHOL 174 11/29/2019   CHOL 175 08/26/2019   Lab Results  Component Value Date   HDL 36 (L) 11/29/2019   HDL 32 (L) 08/26/2019   Lab Results  Component  Value Date   LDLCALC 111 (H) 11/29/2019   LDLCALC 104 (H) 08/26/2019   Lab Results  Component Value Date   TRIG 159 (H) 11/29/2019   TRIG 276 (H) 08/26/2019   Lab Results  Component Value Date   CHOLHDL 4.8 11/29/2019   CHOLHDL 5.5 (H) 08/26/2019   No results found for: LDLDIRECT    Radiology: No results found.  EKG: see HPI   ASSESSMENT AND PLAN:   1. Fatigue:  No obvious cardiac etiology will order echo to assess EF  2. PVC:  Isolated on ECG will order 48 hour holter to quantitate   3. DVT:  Distant on chronic anticoagulation per primary decrease eliquis to 2.5 mg bid due to low GFR and age    71. HLD:  On statin LFTls normal   5. CRF:  Discussed not getting dehydrated during warmer weather GFR estimated at 41 and given age 67 should renally adjust dosage of eliquis   6. HTN:  Well controlled.  Continue current medications and low sodium Dash type diet.      SignedJenkins Rouge 02/22/2020, 12:56 PM

## 2020-02-16 ENCOUNTER — Other Ambulatory Visit: Payer: Self-pay | Admitting: Family Medicine

## 2020-02-16 DIAGNOSIS — N1832 Chronic kidney disease, stage 3b: Secondary | ICD-10-CM

## 2020-02-17 ENCOUNTER — Telehealth: Payer: Self-pay

## 2020-02-17 NOTE — Telephone Encounter (Signed)
It appears dose was already decreased to 2.5 mg bid on 02/15/20

## 2020-02-17 NOTE — Telephone Encounter (Signed)
-----   Message from Josue Hector, MD sent at 02/15/2020  9:42 AM EDT ----- Please have patient lower eliquis dose to 2.5 bid due to age and low GFR additionally only needs 2.5 bid for DVT prophylaxis long term

## 2020-02-22 ENCOUNTER — Ambulatory Visit (INDEPENDENT_AMBULATORY_CARE_PROVIDER_SITE_OTHER): Payer: Medicare Other

## 2020-02-22 ENCOUNTER — Other Ambulatory Visit: Payer: Self-pay

## 2020-02-22 ENCOUNTER — Encounter: Payer: Self-pay | Admitting: Cardiovascular Disease

## 2020-02-22 ENCOUNTER — Ambulatory Visit (INDEPENDENT_AMBULATORY_CARE_PROVIDER_SITE_OTHER): Payer: Medicare Other | Admitting: Cardiovascular Disease

## 2020-02-22 VITALS — BP 130/80 | HR 68 | Ht 65.5 in | Wt 178.0 lb

## 2020-02-22 DIAGNOSIS — I10 Essential (primary) hypertension: Secondary | ICD-10-CM

## 2020-02-22 DIAGNOSIS — F320 Major depressive disorder, single episode, mild: Secondary | ICD-10-CM | POA: Insufficient documentation

## 2020-02-22 DIAGNOSIS — R5383 Other fatigue: Secondary | ICD-10-CM | POA: Diagnosis not present

## 2020-02-22 DIAGNOSIS — I493 Ventricular premature depolarization: Secondary | ICD-10-CM

## 2020-02-22 DIAGNOSIS — R9431 Abnormal electrocardiogram [ECG] [EKG]: Secondary | ICD-10-CM | POA: Insufficient documentation

## 2020-02-22 NOTE — Patient Instructions (Addendum)
Medication Instructions:  Your physician recommends that you continue on your current medications as directed. Please refer to the Current Medication list given to you today.  *If you need a refill on your cardiac medications before your next appointment, please call your pharmacy*   Lab Work: None ordered  If you have labs (blood work) drawn today and your tests are completely normal, you will receive your results only by: Marland Kitchen MyChart Message (if you have MyChart) OR . A paper copy in the mail If you have any lab test that is abnormal or we need to change your treatment, we will call you to review the results.   Testing/Procedures: Your physician has requested that you have an echocardiogram. Echocardiography is a painless test that uses sound waves to create images of your heart. It provides your doctor with information about the size and shape of your heart and how well your heart's chambers and valves are working. This procedure takes approximately one hour. There are no restrictions for this procedure.   Your physician has recommended that you wear a holter monitor. Holter monitors are medical devices that record the heart's electrical activity. Doctors most often use these monitors to diagnose arrhythmias. Arrhythmias are problems with the speed or rhythm of the heartbeat. The monitor is a small, portable device. You can wear one while you do your normal daily activities. This is usually used to diagnose what is causing palpitations/syncope (passing out).     Follow-Up: At [facility], you and your health needs are our priority.  As part of our continuing mission to provide you with exceptional heart care, we have created designated Provider Care Teams.  These Care Teams include your primary Cardiologist (physician) and Advanced Practice Providers (APPs -  Physician Assistants and Nurse Practitioners) who all work together to provide you with the care you need, when you need it.  We  recommend signing up for the patient portal called "MyChart".  Sign up information is provided on this After Visit Summary.  MyChart is used to connect with patients for Virtual Visits (Telemedicine).  Patients are able to view lab/test results, encounter notes, upcoming appointments, etc.  Non-urgent messages can be sent to your provider as well.   To learn more about what you can do with MyChart, go to [email or web address].    Your next appointment:   Depending upon test results  The format for your next appointment:   In Person  Provider:   Jenkins Rouge, MD   Other Instructions  Echocardiogram An echocardiogram is a procedure that uses painless sound waves (ultrasound) to produce an image of the heart. Images from an echocardiogram can provide important information about:  Signs of coronary artery disease (CAD).  Aneurysm detection. An aneurysm is a weak or damaged part of an artery wall that bulges out from the normal force of blood pumping through the body.  Heart size and shape. Changes in the size or shape of the heart can be associated with certain conditions, including heart failure, aneurysm, and CAD.  Heart muscle function.  Heart valve function.  Signs of a past heart attack.  Fluid buildup around the heart.  Thickening of the heart muscle.  A tumor or infectious growth around the heart valves. Tell a health care provider about:  Any allergies you have.  All medicines you are taking, including vitamins, herbs, eye drops, creams, and over-the-counter medicines.  Any blood disorders you have.  Any surgeries you have had.  Any medical conditions you  have.  Whether you are pregnant or may be pregnant. What are the risks? Generally, this is a safe procedure. However, problems may occur, including:  Allergic reaction to dye (contrast) that may be used during the procedure. What happens before the procedure? No specific preparation is needed. You may eat  and drink normally. What happens during the procedure?   An IV tube may be inserted into one of your veins.  You may receive contrast through this tube. A contrast is an injection that improves the quality of the pictures from your heart.  A gel will be applied to your chest.  A wand-like tool (transducer) will be moved over your chest. The gel will help to transmit the sound waves from the transducer.  The sound waves will harmlessly bounce off of your heart to allow the heart images to be captured in real-time motion. The images will be recorded on a computer. The procedure may vary among health care providers and hospitals. What happens after the procedure?  You may return to your normal, everyday life, including diet, activities, and medicines, unless your health care provider tells you not to do that. Summary  An echocardiogram is a procedure that uses painless sound waves (ultrasound) to produce an image of the heart.  Images from an echocardiogram can provide important information about the size and shape of your heart, heart muscle function, heart valve function, and fluid buildup around your heart.  You do not need to do anything to prepare before this procedure. You may eat and drink normally.  After the echocardiogram is completed, you may return to your normal, everyday life, unless your health care provider tells you not to do that. This information is not intended to replace advice given to you by your health care provider. Make sure you discuss any questions you have with your health care provider. Document Revised: 12/16/2018 Document Reviewed: 09/27/2016 Elsevier Patient Education  2020 [employer].   Ambulatory Cardiac Monitoring An ambulatory cardiac monitor is a small recording device that is used to detect abnormal heart rhythms (arrhythmias). Most monitors are connected by wires to flat, sticky disks (electrodes) that are then attached to your chest. You may  need to wear a monitor if you have had symptoms such as:  Fast heartbeats (palpitations).  Dizziness.  Fainting or light-headedness.  Unexplained weakness.  Shortness of breath. There are several types of monitors. Some common monitors include:  Holter monitor. This records your heart rhythm continuously, usually for 24-48 hours.  Event (episodic) monitor. This monitor has a symptoms button, and when pushed, it will begin recording. You need to activate this monitor to record when you have a heart-related symptom.  Automatic detection monitor. This monitor will begin recording when it detects an abnormal heartbeat. What are the risks? Generally, these devices are safe to use. However, it is possible that the skin under the electrodes will become irritated. How to prepare for monitoring Your health care provider will prepare your chest for the electrode placement and show you how to use the monitor.  Do not apply lotions to your chest before monitoring.  Follow directions on how to care for the monitor, and how to return the monitor when the testing period is complete. How to use your cardiac monitor  Follow directions about how long to wear the monitor, and if you can take the monitor off in order to shower or bathe. ? Do not let the monitor get wet. ? Do not bathe, swim, or  use a hot tub while wearing the monitor.  Keep your skin clean. Do not put body lotion or moisturizer on your chest.  Change the electrodes as told by your health care provider, or any time they stop sticking to your skin. You may need to use medical tape to keep them on.  Try to put the electrodes in slightly different places on your chest to help prevent skin irritation. Follow directions from your health care provider about where to place the electrodes.  Make sure the monitor is safely clipped to your clothing or in a location close to your body as recommended by your health care provider.  If your  monitor has a symptoms button, press the button to mark an event as soon as you feel a heart-related symptom, such as: ? Dizziness. ? Weakness. ? Light-headedness. ? Palpitations. ? Thumping or pounding in your chest. ? Shortness of breath. ? Unexplained weakness.  Keep a diary of your activities, such as walking, doing chores, and taking medicine. It is very important to note what you were doing when you pushed the button to record your symptoms. This will help your health care provider determine what might be contributing to your symptoms.  Send the recorded information as recommended by your health care provider. It may take some time for your health care provider to process the results.  Change the batteries as told by your health care provider.  Keep electronic devices away from your monitor. These include: ? Tablets. ? MP3 players. ? Cell phones.  While wearing your monitor you should avoid: ? Electric blankets. ? Armed forces operational officer. ? Electric toothbrushes. ? Microwave ovens. ? Magnets. ? Metal detectors. Get help right away if:  You have chest pain.  You have shortness of breath or extreme difficulty breathing.  You develop a very fast heartbeat that does not get better.  You develop dizziness that does not go away.  You faint or constantly feel like you are about to faint. Summary  An ambulatory cardiac monitor is a small recording device that is used to detect abnormal heart rhythms (arrhythmias).  Make sure you understand how to send the information from the monitor to your health care provider.  It is important to press the button on the monitor when you have any heart-related symptoms.  Keep a diary of your activities, such as walking, doing chores, and taking medicine. It is very important to note what you were doing when you pushed the button to record your symptoms. This will help your health care provider learn what might be causing your symptoms. This  information is not intended to replace advice given to you by your health care provider. Make sure you discuss any questions you have with your health care provider. Document Revised: 08/07/2017 Document Reviewed: 08/09/2016 Elsevier Patient Education  2020 [employer].

## 2020-02-22 NOTE — Assessment & Plan Note (Signed)
She reports continued onset of fatigue she has had fatigue off and on over the last year.  Will be getting updated labs to see if anything is changed.  And will treat as appropriate.

## 2020-02-22 NOTE — Assessment & Plan Note (Signed)
Nicole Good is encouraged to maintain a well balanced diet that is low in salt. Controlled, continue current medication regimen.  Additionally, she is also reminded that exercise is beneficial for heart health and control of  Blood pressure. 30-60 minutes daily is recommended-walking was suggested.

## 2020-02-22 NOTE — Assessment & Plan Note (Signed)
Getting updated labs including a magnesium and thyroid level.    Cardiology appointment upcoming.  Secondary changes on EKG and increased fatigue.

## 2020-02-22 NOTE — Assessment & Plan Note (Addendum)
Will be getting updated labs to see if this is the cause of her fatigue. Continue to avoid NSAID and like medication. Adjustment to Eliquis made secondary to kidney change

## 2020-02-22 NOTE — Assessment & Plan Note (Signed)
Eliquis reduced from 5 mg twice daily to 2.5 mg twice daily

## 2020-02-22 NOTE — Assessment & Plan Note (Signed)
Normal sinus rhythm, 64 bpm.  Possible left atrial enlargement, left ventricular hypertrophy, nonspecific T wave abnormality, abnormal EKG. cardiology referral in place.

## 2020-02-22 NOTE — Assessment & Plan Note (Signed)
PHQ-9 is elevated at a 6.  Decreased energy and appetite change.  She appears to be a little bit more depressed than previous visits.  No start of medication at this time.  Will get updated labs and revisit after cardiology for work-up  labs have resulted.  Denies SI and HI at this time.  Could be that her depression is causing her to have the fatigue.  Could be the other way around.

## 2020-02-24 ENCOUNTER — Encounter

## 2020-02-24 DIAGNOSIS — I493 Ventricular premature depolarization: Secondary | ICD-10-CM | POA: Diagnosis not present

## 2020-02-29 ENCOUNTER — Ambulatory Visit: Admitting: Family Medicine

## 2020-03-05 ENCOUNTER — Ambulatory Visit (HOSPITAL_COMMUNITY)
Admission: RE | Admit: 2020-03-05 | Discharge: 2020-03-05 | Disposition: A | Discharge status: Home / Self Care | Payer: Medicare Other | Source: Ambulatory Visit | Attending: Cardiovascular Disease | Admitting: Cardiovascular Disease

## 2020-03-05 ENCOUNTER — Other Ambulatory Visit: Payer: Self-pay

## 2020-03-05 DIAGNOSIS — R7303 Prediabetes: Secondary | ICD-10-CM | POA: Diagnosis not present

## 2020-03-05 DIAGNOSIS — N1832 Chronic kidney disease, stage 3b: Secondary | ICD-10-CM | POA: Diagnosis not present

## 2020-03-05 DIAGNOSIS — E785 Hyperlipidemia, unspecified: Secondary | ICD-10-CM | POA: Insufficient documentation

## 2020-03-05 DIAGNOSIS — I129 Hypertensive chronic kidney disease with stage 1 through stage 4 chronic kidney disease, or unspecified chronic kidney disease: Secondary | ICD-10-CM | POA: Diagnosis not present

## 2020-03-05 DIAGNOSIS — Z86718 Personal history of other venous thrombosis and embolism: Secondary | ICD-10-CM | POA: Insufficient documentation

## 2020-03-05 DIAGNOSIS — I071 Rheumatic tricuspid insufficiency: Secondary | ICD-10-CM | POA: Diagnosis not present

## 2020-03-05 DIAGNOSIS — R5383 Other fatigue: Secondary | ICD-10-CM | POA: Insufficient documentation

## 2020-03-05 NOTE — Progress Notes (Signed)
*  PRELIMINARY RESULTS* Echocardiogram 2D Echocardiogram has been performed.  Samuel Germany 03/05/2020, 1:48 PM

## 2020-03-07 ENCOUNTER — Other Ambulatory Visit: Payer: Self-pay

## 2020-03-14 ENCOUNTER — Telehealth: Payer: Self-pay

## 2020-03-14 NOTE — Telephone Encounter (Signed)
-----   Message from Josue Hector, MD sent at 03/14/2020 11:44 AM EDT ----- Monitor benign infrequent short bursts SVT and PAC;s  Continue current Rx for HTN Echo is fine with normal EF and no significant valve disease ----- Message ----- From: Howie Ill Sent: 03/14/2020   9:53 AM EDT To: Josue Hector, MD  Results from [facility] monitor

## 2020-03-14 NOTE — Telephone Encounter (Signed)
I spoke with patients and gave her results of tests.She was very relieved.

## 2020-03-15 ENCOUNTER — Other Ambulatory Visit: Payer: Self-pay

## 2020-03-15 ENCOUNTER — Encounter: Payer: Self-pay | Admitting: Family Medicine

## 2020-03-15 ENCOUNTER — Telehealth (INDEPENDENT_AMBULATORY_CARE_PROVIDER_SITE_OTHER): Payer: Medicare Other | Admitting: Family Medicine

## 2020-03-15 VITALS — BP 128/82 | HR 77 | Temp 97.3°F | Resp 16 | Ht 65.5 in | Wt 179.4 lb

## 2020-03-15 DIAGNOSIS — I493 Ventricular premature depolarization: Secondary | ICD-10-CM

## 2020-03-15 DIAGNOSIS — I10 Essential (primary) hypertension: Secondary | ICD-10-CM | POA: Diagnosis not present

## 2020-03-15 NOTE — Patient Instructions (Signed)
I appreciate the opportunity to provide you with care for your health and wellness. Today we discussed: overall health  Follow up: 3-4 months in office   No labs or referrals today  Have a great Summer :)   Please continue to practice social distancing to keep you, your family, and our community safe.  If you must go out, please wear a mask and practice good handwashing.  It was a pleasure to see you and I look forward to continuing to work together on your health and well-being. Please do not hesitate to call the office if you need care or have questions about your care.  Have a wonderful day and week. With Gratitude, Cherly Beach, DNP, AGNP-BC

## 2020-03-15 NOTE — Progress Notes (Signed)
Subjective:  Patient ID: Nicole Good, female    DOB: 07/15/1941  Age: 79 y.o. MRN: [id number]  CC:  Chief Complaint  Patient presents with  . Follow-up    4-6 week follow up everything is better went to cardio everything went good       HPI  HPI   Nicole Good is an 79 year old female patient of mine.  She presents today for follow-up after coming in several weeks back with having extreme fatigue and noted to have changes on EKG.  I sent her to cardiology EKG demonstrated abnormal changes and she had PVCs.  Cardiology checked her out with Holter monitor and echo everything is turned out well.  Discontinued blood pressure control and eating well diet was recommended.  She denies having any excessive fatigue today.  She reports that she is doing well not having any issues with is not having any bleeding episodes.  Taking her medications as directed.  She denies having any chest pain in the office.  Denies having any palpitations, leg swelling, cough, shortness of breath or any other signs or symptoms of infection and/or changes in heart.   Today patient denies signs and symptoms of COVID 19 infection including fever, chills, cough, shortness of breath, and headache. Past Medical, Surgical, Social History, Allergies, and Medications have been Reviewed.   Past Medical History:  Diagnosis Date  . Arthritis    "maybe in my right knee and hip" (12/03/2015)  . DVT (deep venous thrombosis) ([place]) 12/03/2015   LLE  . Heart murmur    "when I was a little girl; gone now"  . High cholesterol   . Hypertension   . Osteoporosis   . Prolapsed internal hemorrhoids, grade 3 07/04/2014  . Pulmonary emboli ([place]) ~ 2012  . Right arm weakness 10/30/2015    No outpatient medications have been marked as taking for the 03/15/20 encounter (Video Visit) with Perlie Mayo, NP.    ROS:  Review of Systems  Constitutional: Negative.   HENT: Negative.   Eyes: Negative.   Respiratory: Negative.     Cardiovascular: Negative.   Gastrointestinal: Negative.   Genitourinary: Negative.   Musculoskeletal: Negative.   Skin: Negative.   Neurological: Negative.   Endo/Heme/Allergies: Negative.   Psychiatric/Behavioral: Negative.   All other systems reviewed and are negative.    Objective:   Today's Vitals: BP 128/82 (BP Location: Right Arm, Patient Position: Sitting, Cuff Size: Normal)   Pulse 77   Temp (!) 97.3 F (36.3 C) (Temporal)   Resp 16   Ht 5' 5.5" (1.664 m)   Wt 179 lb 6.4 oz (81.4 kg)   SpO2 99%   BMI 29.40 kg/m  Vitals with BMI 03/15/2020 02/22/2020 02/14/2020  Height 5' 5.5" 5' 5.5" 5' 5.5"  Weight 179 lbs 6 oz 178 lbs 167 lbs 6 oz  BMI 29.39 49.70 26.37  Systolic 858 850 277  Diastolic 82 80 83  Pulse 77 68 83     Physical Exam Vitals and nursing note reviewed.  Constitutional:      Appearance: Normal appearance. She is well-developed, well-groomed and overweight.  HENT:     Head: Normocephalic and atraumatic.     Right Ear: External ear normal.     Left Ear: External ear normal.     Mouth/Throat:     Comments: Mask in place  Eyes:     General:        Right eye: No discharge.  Left eye: No discharge.     Conjunctiva/sclera: Conjunctivae normal.  Cardiovascular:     Rate and Rhythm: Normal rate and regular rhythm.     Pulses: Normal pulses.     Heart sounds: Normal heart sounds.  Pulmonary:     Effort: Pulmonary effort is normal.     Breath sounds: Normal breath sounds.  Musculoskeletal:        General: Normal range of motion.     Cervical back: Normal range of motion and neck supple.  Skin:    General: Skin is warm.  Neurological:     General: No focal deficit present.     Mental Status: She is alert and oriented to person, place, and time.  Psychiatric:        Attention and Perception: Attention normal.        Mood and Affect: Mood normal.        Speech: Speech normal.        Behavior: Behavior normal. Behavior is cooperative.         Thought Content: Thought content normal.        Cognition and Memory: Cognition normal.        Judgment: Judgment normal.     Assessment   1. Essential hypertension   2. PVC's (premature ventricular contractions)     Tests ordered No orders of the defined types were placed in this encounter.    Plan: Please see assessment and plan per problem list above.   No orders of the defined types were placed in this encounter.   Patient to follow-up in 3-4 month in office   Perlie Mayo, NP

## 2020-03-15 NOTE — Assessment & Plan Note (Signed)
Labs stable  Heart monitor and Echo good, cleared from cardiology

## 2020-03-15 NOTE — Assessment & Plan Note (Signed)
Stable, continue medications as directed. DASH diet and exercise encouraged

## 2020-04-02 ENCOUNTER — Emergency Department (HOSPITAL_COMMUNITY)
Admission: EM | Admit: 2020-04-02 | Discharge: 2020-04-02 | Discharge status: Against Medical Advice | Payer: Medicare Other

## 2020-04-02 ENCOUNTER — Other Ambulatory Visit: Payer: Self-pay

## 2020-04-04 ENCOUNTER — Other Ambulatory Visit: Payer: Self-pay

## 2020-04-04 ENCOUNTER — Emergency Department (HOSPITAL_COMMUNITY): Payer: Medicare Other

## 2020-04-04 ENCOUNTER — Emergency Department (HOSPITAL_COMMUNITY)
Admission: EM | Admit: 2020-04-04 | Discharge: 2020-04-04 | Disposition: A | Discharge status: Home / Self Care | Payer: Medicare Other | Attending: Emergency Medicine | Admitting: Emergency Medicine

## 2020-04-04 ENCOUNTER — Encounter (HOSPITAL_COMMUNITY): Payer: Self-pay

## 2020-04-04 DIAGNOSIS — N1832 Chronic kidney disease, stage 3b: Secondary | ICD-10-CM | POA: Insufficient documentation

## 2020-04-04 DIAGNOSIS — Z7901 Long term (current) use of anticoagulants: Secondary | ICD-10-CM | POA: Diagnosis not present

## 2020-04-04 DIAGNOSIS — I129 Hypertensive chronic kidney disease with stage 1 through stage 4 chronic kidney disease, or unspecified chronic kidney disease: Secondary | ICD-10-CM | POA: Diagnosis not present

## 2020-04-04 DIAGNOSIS — Z79899 Other long term (current) drug therapy: Secondary | ICD-10-CM | POA: Insufficient documentation

## 2020-04-04 DIAGNOSIS — M79674 Pain in right toe(s): Secondary | ICD-10-CM | POA: Diagnosis not present

## 2020-04-04 MED ORDER — PREDNISONE 20 MG PO TABS: 20.0000 mg | ORAL_TABLET | Freq: Every day | ORAL | 0 refills | Status: DC

## 2020-04-04 NOTE — Discharge Instructions (Addendum)
Your x-ray today did not show any broken bones.  Elevate your foot when possible.  Minimal walking or standing for 2 to 3 days.  Follow-up with your primary doctor for recheck.

## 2020-04-04 NOTE — ED Triage Notes (Signed)
Pt reports pain in R great toe for the past week.  Denies injury.  Reports redness and swelling.

## 2020-04-06 NOTE — ED Provider Notes (Signed)
[facility] Provider Note   CSN: [id number] Arrival date & time: 04/04/20  4098     History Chief Complaint  Patient presents with  . Toe Pain    Nicole Good is a 79 y.o. female.  HPI      Nicole Good is a 79 y.o. female with hx of PE/DVT, HTN who presents to the Emergency Department complaining of right great toe pain for nearly one week.  Describes a throbbing pain to the base of her great toe.  Pain is associated with swelling at the MCP joint and redness.  Pain has gradually improved.  She denies injury, no pain or  Redness of the great toe itself or symptoms radiating into her foot or ankle.  No prior history of gout.      Past Medical History:  Diagnosis Date  . Arthritis    "maybe in my right knee and hip" (12/03/2015)  . DVT (deep venous thrombosis) ([place]) 12/03/2015   LLE  . Heart murmur    "when I was a little girl; gone now"  . High cholesterol   . Hypertension   . Osteoporosis   . Prolapsed internal hemorrhoids, grade 3 07/04/2014  . Pulmonary emboli ([place]) ~ 2012  . Right arm weakness 10/30/2015    Patient Active Problem List   Diagnosis Date Noted  . Abnormal EKG 02/22/2020  . PVC's (premature ventricular contractions) 02/22/2020  . Current mild episode of major depressive disorder without prior episode ([place]) 02/22/2020  . Prediabetes 11/24/2019  . Post-menopausal 11/24/2019  . Fatigue 08/23/2019  . Stage 3b chronic kidney disease 07/29/2019  . 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 Gastritis 07/27/2018  . Observed sleep apnea 06/02/2018  . Dry eye syndrome of both lacrimal glands 12/06/2017  . 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 Pseudophakia of both eyes 12/06/2017  . Other pulmonary embolism without acute cor pulmonale ([place]) 12/04/2015  . Anticoagulant long-term use 12/04/2015  . DVT (deep venous thrombosis), left 12/03/2015  . Personal history of pulmonary embolism 12/03/2015  . H/O deep  venous thrombosis 12/03/2015  . Osteoarthritis, knee 05/06/2012  . Overweight (BMI 25.0-29.9) 02/05/2012  . Essential hypertension 03/21/2011  . Hypertriglyceridemia 03/21/2011  . Multiple lipomas 03/21/2011  . Osteoporosis 03/21/2011    Past Surgical History:  Procedure Laterality Date  . ABDOMINAL HYSTERECTOMY  ~ 1979   "partial"  . CATARACT EXTRACTION W/ INTRAOCULAR LENS  IMPLANT, BILATERAL Bilateral 2017  . DILATION AND CURETTAGE OF UTERUS    . TONSILLECTOMY  1950     OB History    Gravida  3   Para  1   Term  1   Preterm      AB  2   Living  1     SAB  1   TAB      Ectopic  1   Multiple      Live Births              Family History  Problem Relation Age of Onset  . Stroke Mother   . Heart disease Mother   . Hypertension Maternal Grandmother     Social History   Tobacco Use  . Smoking status: Never Smoker  . Smokeless tobacco: Never Used  Substance Use Topics  . Alcohol use: No  . Drug use: No    Home Medications Prior to Admission medications   Medication Sig Start Date End Date Taking? Authorizing Provider  apixaban (ELIQUIS) 2.5  MG TABS tablet Take 1 tablet (2.5 mg total) by mouth 2 (two) times daily. 02/15/20   Perlie Mayo, NP  Calcium 500 MG tablet Take 1 tablet (500 mg total) by mouth 3 (three) times daily with meals. 02/15/20   Perlie Mayo, NP  Cholecalciferol (VITAMIN D3) 1.25 MG (50000 UT) CAPS Take 1 capsule by mouth daily.    [provider]  hydrochlorothiazide (HYDRODIURIL) 25 MG tablet Take 25 mg by mouth daily. 01/30/20   [provider]  lisinopril (ZESTRIL) 10 MG tablet Take 10 mg by mouth daily. 10/27/19   [provider]  Multiple Vitamins-Minerals (MULTIVITAMINS THER. W/MINERALS) TABS Take 1 tablet by mouth every morning.     [provider]  omega-3 acid ethyl esters (LOVAZA) 1 g capsule Take 1 capsule by mouth 2 (two) times daily. 11/30/19   [provider]  pantoprazole  (PROTONIX) 40 MG tablet Take 40 mg by mouth daily.    [provider]  predniSONE (DELTASONE) 20 MG tablet Take 1 tablet (20 mg total) by mouth daily. 04/04/20   Sruthi Maurer, PA-C  rosuvastatin (CRESTOR) 10 MG tablet Take 10 mg by mouth daily.    [provider]    Allergies    Patient has no known allergies.  Review of Systems   Review of Systems  Constitutional: Negative for chills, fatigue and fever.  Respiratory: Negative for cough and shortness of breath.   Cardiovascular: Negative for chest pain, palpitations and leg swelling.  Gastrointestinal: Negative for abdominal pain, nausea and vomiting.  Genitourinary: Negative for dysuria, flank pain and hematuria.  Musculoskeletal: Positive for arthralgias (right great toe pain). Negative for back pain, myalgias, neck pain and neck stiffness.  Skin: Negative for rash.  Neurological: Negative for dizziness, weakness and numbness.  Hematological: Does not bruise/bleed easily.    Physical Exam Updated Vital Signs BP 118/75   Pulse 51   Temp 98.2 F (36.8 C)   Resp 18   Ht 5\' 5"  (1.651 m)   Wt 78.9 kg   SpO2 100%   BMI 28.96 kg/m   Physical Exam Vitals and nursing note reviewed.  Constitutional:      Appearance: Normal appearance. She is not ill-appearing or toxic-appearing.  HENT:     Head: Atraumatic.  Cardiovascular:     Rate and Rhythm: Normal rate and regular rhythm.     Pulses: Normal pulses.  Pulmonary:     Effort: Pulmonary effort is normal.     Breath sounds: Normal breath sounds.  Chest:     Chest wall: No tenderness.  Abdominal:     Palpations: Abdomen is soft.     Tenderness: There is no abdominal tenderness.  Musculoskeletal:        General: Tenderness present. No deformity or signs of injury. Normal range of motion.     Cervical back: Normal range of motion. No tenderness.     Comments: Localized ttp of the first MT head.  Mild erythema and edema noted.  No lymphangitis.  Distal toe  non-tender.  No open wounds to the foot.   Neurological:     Mental Status: She is alert.     ED Results / Procedures / Treatments   Labs (all labs ordered are listed, but only abnormal results are displayed) Labs Reviewed - No data to display  EKG None  Radiology DG Toe Great Right  Result Date: 04/04/2020 CLINICAL DATA:  Pt states she woke up Monday morning and her right great  toe was swollen and painful/no known injury EXAM: RIGHT GREAT TOE COMPARISON:  None. FINDINGS: No fracture or bone lesion. First metatarsophalangeal joint is normally spaced and aligned. There minimal marginal osteophytes. A small marginal erosion is suggested along the lateral base of the great toe proximal phalanx. The IP joint of the great toe is normally spaced and aligned no arthropathic changes. Soft tissue swelling, mild, is noted at the first metacarpophalangeal joint. IMPRESSION: 1. No fracture. 2. Minor arthropathic changes noted at the first metatarsophalangeal joint with a possible small marginal erosion. Electronically Signed   By: Lajean Manes M.D.   On: 04/04/2020 11:31     Procedures Procedures (including critical care time)  Medications Ordered in ED Medications - No data to display  ED Course  I have reviewed the triage vital signs and the nursing notes.  Pertinent labs & imaging results that were available during my care of the patient were reviewed by me and considered in my medical decision making (see chart for details).    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  NV intact.  No injury.  Sx's gradually improving since onset.  Pt able to bear weight.  XR neg for acute bony injury.  Pt takes anti coagulant, will refrain from NSAID. Doubt septic joint or cellulitis.   Will tx with short course of steroid, she agrees to close out pt f/u.     Final Clinical Impression(s) / ED Diagnoses Final diagnoses:  Great toe pain, right    Rx  / DC Orders ED Discharge Orders         Ordered    predniSONE (DELTASONE) 20 MG tablet  Daily     Discontinue  Reprint     04/04/20 1208           Kem Parkinson, PA-C 04/06/20 2332    Varney Biles, MD 04/06/20 [phone]

## 2020-05-23 ENCOUNTER — Other Ambulatory Visit: Payer: Self-pay | Admitting: Nephrology

## 2020-05-23 DIAGNOSIS — N1832 Chronic kidney disease, stage 3b: Secondary | ICD-10-CM

## 2020-05-28 ENCOUNTER — Other Ambulatory Visit: Payer: Self-pay

## 2020-05-28 ENCOUNTER — Ambulatory Visit (HOSPITAL_COMMUNITY)
Admission: RE | Admit: 2020-05-28 | Discharge: 2020-05-28 | Disposition: A | Discharge status: Home / Self Care | Payer: Medicare Other | Source: Ambulatory Visit | Attending: Nephrology | Admitting: Nephrology

## 2020-05-28 DIAGNOSIS — N1832 Chronic kidney disease, stage 3b: Secondary | ICD-10-CM | POA: Diagnosis present

## 2020-06-21 ENCOUNTER — Ambulatory Visit: Admitting: Family Medicine

## 2020-06-26 ENCOUNTER — Other Ambulatory Visit: Payer: Self-pay

## 2020-06-26 ENCOUNTER — Ambulatory Visit (INDEPENDENT_AMBULATORY_CARE_PROVIDER_SITE_OTHER): Payer: Medicare Other | Admitting: Family Medicine

## 2020-06-26 ENCOUNTER — Encounter: Payer: Self-pay | Admitting: Family Medicine

## 2020-06-26 VITALS — BP 122/76 | HR 86 | Temp 98.1°F | Ht 66.0 in | Wt 160.0 lb

## 2020-06-26 DIAGNOSIS — I10 Essential (primary) hypertension: Secondary | ICD-10-CM

## 2020-06-26 DIAGNOSIS — H02421 Myogenic ptosis of right eyelid: Secondary | ICD-10-CM

## 2020-06-26 DIAGNOSIS — N1832 Chronic kidney disease, stage 3b: Secondary | ICD-10-CM

## 2020-06-26 NOTE — Assessment & Plan Note (Signed)
She is now being closely followed by nephrology.  Continue to avoid NSAIDs and other like medications.  Continue all medication as ordered.

## 2020-06-26 NOTE — Assessment & Plan Note (Signed)
Controlled, continue current medications as directed.  DASH diet and exercise are encouraged as well.

## 2020-06-26 NOTE — Assessment & Plan Note (Signed)
Continues to have the droopiness of the right eyelid.  Does not want to do any surgery as she does not feel that it is worth it given her age.  She reports that she just a visual issue for her because she does like people noticing it.  But her vision is good and intact.  Is followed by ophthalmology.

## 2020-06-26 NOTE — Progress Notes (Signed)
Subjective:  Patient ID: Nicole Good, female    DOB: [birth year]  Age: 79 y.o. MRN: [id number]  CC:  Chief Complaint  Patient presents with  . Follow-up      HPI  HPI Nicole Good is an 79 year old female patient of mine.  She presents today for follow-up chronic conditions.  She denies having any issues or concerns to discuss.  Overall she is doing very well she reports taking her medication as directed.  Denies having any chest pain, headaches, leg swelling, dizziness, vision changes or palpitations.  Blood pressures well controlled today in the office.  She reports that she is sleeping well.  Denies any changes in bowel or bladder habits.  Reports she gets up twice at night to go to the restroom.  No blood in urine or stool. Denies falls or injuries. Denies changes in hearing or seeing.  Still has a droopy eyelid on the right side.  Reports that she does not think that she will get any more surgery on this.  It is very frustrating to her visually as she does not want people to see it but she does not think that the surgery will do much good because it will last for couple years.  Today patient denies signs and symptoms of COVID 19 infection including fever, chills, cough, shortness of breath, and headache. Past Medical, Surgical, Social History, Allergies, and Medications have been Reviewed.   Past Medical History:  Diagnosis Date  . Arthritis    "maybe in my right knee and hip" (12/03/2015)  . DVT (deep venous thrombosis) ([place]) 12/03/2015   LLE  . Heart murmur    "when I was a little girl; gone now"  . High cholesterol   . Hypertension   . Osteoporosis   . Prolapsed internal hemorrhoids, grade 3 07/04/2014  . Pulmonary emboli ([place]) ~ 2012  . Right arm weakness 10/30/2015    Current Meds  Medication Sig  . apixaban (ELIQUIS) 2.5 MG TABS tablet Take 1 tablet (2.5 mg total) by mouth 2 (two) times daily.  . Calcium 500 MG tablet Take 1 tablet (500 mg total) by mouth 3  (three) times daily with meals.  . Cholecalciferol (VITAMIN D3) 1.25 MG (50000 UT) CAPS Take 1 capsule by mouth daily.  . hydrochlorothiazide (HYDRODIURIL) 25 MG tablet Take 25 mg by mouth daily.  Marland Kitchen lisinopril (ZESTRIL) 10 MG tablet Take 10 mg by mouth daily.  . Multiple Vitamins-Minerals (MULTIVITAMINS THER. W/MINERALS) TABS Take 1 tablet by mouth every morning.   Marland Kitchen omega-3 acid ethyl esters (LOVAZA) 1 g capsule Take 1 capsule by mouth 2 (two) times daily.  . pantoprazole (PROTONIX) 40 MG tablet Take 40 mg by mouth daily.    ROS:  Review of Systems  HENT: Negative.   Eyes: Negative.   Respiratory: Negative.   Cardiovascular: Negative.   Gastrointestinal: Negative.   Genitourinary: Negative.   Musculoskeletal: Negative.   Skin: Negative.   Neurological: Negative.   Endo/Heme/Allergies: Negative.   Psychiatric/Behavioral: Negative.      Objective:   Today's Vitals: BP 122/76 (BP Location: Left Arm, Patient Position: Sitting, Cuff Size: Normal)   Pulse 86   Temp 98.1 F (36.7 C) (Tympanic)   Ht 5\' 6"  (1.676 m)   Wt 160 lb (72.6 kg)   SpO2 97%   BMI 25.82 kg/m  Vitals with BMI 06/26/2020 04/04/2020 04/04/2020  Height 5\' 6"  - -  Weight 160 lbs - -  BMI 25.84 - -  Systolic 253 664 403  Diastolic 76 75 98  Pulse 86 51 69     Physical Exam Vitals and nursing note reviewed.  Constitutional:      Appearance: Normal appearance. She is well-developed, well-groomed and overweight.  HENT:     Head: Normocephalic and atraumatic.     Right Ear: External ear normal.     Left Ear: External ear normal.     Mouth/Throat:     Comments: Mask in place Eyes:     General:        Right eye: No discharge.        Left eye: No discharge.     Conjunctiva/sclera: Conjunctivae normal.     Comments: Right eyelid droop- no change in vision   Cardiovascular:     Rate and Rhythm: Normal rate and regular rhythm.     Pulses: Normal pulses.     Heart sounds: Normal heart sounds.  Pulmonary:      Effort: Pulmonary effort is normal.     Breath sounds: Normal breath sounds.  Musculoskeletal:        General: Normal range of motion.     Cervical back: Normal range of motion and neck supple.  Skin:    General: Skin is warm.  Neurological:     General: No focal deficit present.     Mental Status: She is alert and oriented to person, place, and time.  Psychiatric:        Attention and Perception: Attention normal.        Mood and Affect: Mood normal.        Speech: Speech normal.        Behavior: Behavior normal. Behavior is cooperative.        Thought Content: Thought content normal.        Cognition and Memory: Cognition normal.        Judgment: Judgment normal.    Assessment   1. Stage 3b chronic kidney disease ([place])   2. Myogenic ptosis of right eyelid   3. Essential hypertension     Tests ordered No orders of the defined types were placed in this encounter.    Plan: Please see assessment and plan per problem list above.   No orders of the defined types were placed in this encounter.   Patient to follow-up in 10/30/2020  Note: This dictation was prepared with Dragon dictation along with smaller phrase technology. Similar sounding words can be transcribed inadequately or may not be corrected upon review. Any transcriptional errors that result from this process are unintentional.      Perlie Mayo, NP

## 2020-06-26 NOTE — Patient Instructions (Signed)
  HAPPY FALL!  I appreciate the opportunity to provide you with care for your health and wellness. Today we discussed: overall health   Follow up:  3-4 months   No labs or referrals today  So glad kidneys are doing better!  Have a great Holiday Season!   Please continue to practice social distancing to keep you, your family, and our community safe.  If you must go out, please wear a mask and practice good handwashing.  It was a pleasure to see you and I look forward to continuing to work together on your health and well-being. Please do not hesitate to call the office if you need care or have questions about your care.  Have a wonderful day and week. With Gratitude, Cherly Beach, DNP, AGNP-BC

## 2020-08-12 ENCOUNTER — Encounter (HOSPITAL_COMMUNITY): Payer: Self-pay | Admitting: Emergency Medicine

## 2020-08-12 ENCOUNTER — Emergency Department (HOSPITAL_COMMUNITY)
Admission: EM | Admit: 2020-08-12 | Discharge: 2020-08-12 | Disposition: A | Discharge status: Home / Self Care | Payer: Medicare Other | Attending: Emergency Medicine | Admitting: Emergency Medicine

## 2020-08-12 ENCOUNTER — Emergency Department (HOSPITAL_COMMUNITY): Payer: Medicare Other

## 2020-08-12 ENCOUNTER — Other Ambulatory Visit: Payer: Self-pay

## 2020-08-12 DIAGNOSIS — Z79899 Other long term (current) drug therapy: Secondary | ICD-10-CM | POA: Insufficient documentation

## 2020-08-12 DIAGNOSIS — Z7901 Long term (current) use of anticoagulants: Secondary | ICD-10-CM | POA: Insufficient documentation

## 2020-08-12 DIAGNOSIS — M79675 Pain in left toe(s): Secondary | ICD-10-CM | POA: Diagnosis not present

## 2020-08-12 DIAGNOSIS — N1832 Chronic kidney disease, stage 3b: Secondary | ICD-10-CM | POA: Insufficient documentation

## 2020-08-12 DIAGNOSIS — I129 Hypertensive chronic kidney disease with stage 1 through stage 4 chronic kidney disease, or unspecified chronic kidney disease: Secondary | ICD-10-CM | POA: Diagnosis not present

## 2020-08-12 MED ORDER — HYDROCODONE-ACETAMINOPHEN 5-325 MG PO TABS
1.0000 | ORAL_TABLET | ORAL | 0 refills | Status: DC | PRN
Start: 2020-08-12 — End: 2020-10-25

## 2020-08-12 MED ORDER — PREDNISONE 20 MG PO TABS
20.0000 mg | ORAL_TABLET | Freq: Once | ORAL | Status: AC
  Administered 2020-08-12: 20 mg via ORAL
  Filled 2020-08-12: qty 1

## 2020-08-12 MED ORDER — PREDNISONE 20 MG PO TABS: 20.0000 mg | ORAL_TABLET | Freq: Every day | ORAL | 0 refills | Status: AC

## 2020-08-12 NOTE — ED Triage Notes (Signed)
Pt c/o left greater toe pain since 08/08/20.

## 2020-08-12 NOTE — ED Provider Notes (Signed)
[facility] EMERGENCY DEPARTMENT Provider Note   CSN: [id number] Arrival date & time: 08/12/20  1018     History Chief Complaint  Patient presents with  . Toe Pain    Nicole Good is a 79 y.o. female with past medical history significant for PE, DVT on chronic anticoagulation, CKD, arthritis, hypertension, osteoporosis who presents for evaluation of left great toe pain.  Pain started 4 days ago.  Located to the base of her first metatarsal.  Does not extend into foot.  She denies any redness, swelling, warmth.  States this feels similar to an episode of gout she had a few months ago.  She is not any chronic gout medications.  No recent trauma or injury.  She denies any unilateral leg swelling, redness or warmth.  Denies fever, chills, nausea, vomiting, chest pain, shortness breath abdominal pain, diarrhea dysuria, paresthesias or weakness.  She is ambulating without any difficulty.  Denies additional aggravating or alleviating factors.  History of pain from patient and past medical records. No interpreter used.  HPI     Past Medical History:  Diagnosis Date  . Arthritis    "maybe in my right knee and hip" (12/03/2015)  . DVT (deep venous thrombosis) ([place]) 12/03/2015   LLE  . Heart murmur    "when I was a little girl; gone now"  . High cholesterol   . Hypertension   . Osteoporosis   . Prolapsed internal hemorrhoids, grade 3 07/04/2014  . Pulmonary emboli ([place]) ~ 2012  . Right arm weakness 10/30/2015    Patient Active Problem List   Diagnosis Date Noted  . Abnormal EKG 02/22/2020  . PVC's (premature ventricular contractions) 02/22/2020  . Current mild episode of major depressive disorder without prior episode ([place]) 02/22/2020  . Prediabetes 11/24/2019  . Post-menopausal 11/24/2019  . Fatigue 08/23/2019  . Stage 3b chronic kidney disease ([place]) 07/29/2019  . 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 Gastritis 07/27/2018  . Observed sleep apnea 06/02/2018  .  Dry eye syndrome of both lacrimal glands 12/06/2017  . 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 Pseudophakia of both eyes 12/06/2017  . Other pulmonary embolism without acute cor pulmonale ([place]) 12/04/2015  . Anticoagulant long-term use 12/04/2015  . DVT (deep venous thrombosis), left 12/03/2015  . Personal history of pulmonary embolism 12/03/2015  . H/O deep venous thrombosis 12/03/2015  . Osteoarthritis, knee 05/06/2012  . Overweight (BMI 25.0-29.9) 02/05/2012  . Essential hypertension 03/21/2011  . Hypertriglyceridemia 03/21/2011  . Multiple lipomas 03/21/2011  . Osteoporosis 03/21/2011    Past Surgical History:  Procedure Laterality Date  . ABDOMINAL HYSTERECTOMY  ~ 1979   "partial"  . CATARACT EXTRACTION W/ INTRAOCULAR LENS  IMPLANT, BILATERAL Bilateral 2017  . DILATION AND CURETTAGE OF UTERUS    . TONSILLECTOMY  1950     OB History    Gravida  3   Para  1   Term  1   Preterm      AB  2   Living  1     SAB  1   TAB      Ectopic  1   Multiple      Live Births              Family History  Problem Relation Age of Onset  . Stroke Mother   . Heart disease Mother   . Hypertension Maternal Grandmother     Social History   Tobacco Use  . Smoking status: Never  Smoker  . Smokeless tobacco: Never Used  Vaping Use  . Vaping Use: Never used  Substance Use Topics  . Alcohol use: No  . Drug use: No    Home Medications Prior to Admission medications   Medication Sig Start Date End Date Taking? Authorizing Provider  apixaban (ELIQUIS) 2.5 MG TABS tablet Take 1 tablet (2.5 mg total) by mouth 2 (two) times daily. 02/15/20   Perlie Mayo, NP  Calcium 500 MG tablet Take 1 tablet (500 mg total) by mouth 3 (three) times daily with meals. 02/15/20   Perlie Mayo, NP  Cholecalciferol (VITAMIN D3) 1.25 MG (50000 UT) CAPS Take 1 capsule by mouth daily.    [provider]  hydrochlorothiazide (HYDRODIURIL) 25 MG tablet Take 25 mg by mouth daily.  01/30/20   [provider]  HYDROcodone-acetaminophen (NORCO/VICODIN) 5-325 MG tablet Take 1 tablet by mouth every 4 (four) hours as needed. 08/12/20   Jerrell Hart A, PA-C  lisinopril (ZESTRIL) 10 MG tablet Take 10 mg by mouth daily. 10/27/19   [provider]  Multiple Vitamins-Minerals (MULTIVITAMINS THER. W/MINERALS) TABS Take 1 tablet by mouth every morning.     [provider]  omega-3 acid ethyl esters (LOVAZA) 1 g capsule Take 1 capsule by mouth 2 (two) times daily. 11/30/19   [provider]  pantoprazole (PROTONIX) 40 MG tablet Take 40 mg by mouth daily.    [provider]  predniSONE (DELTASONE) 20 MG tablet Take 1 tablet (20 mg total) by mouth daily for 5 days. 08/12/20 08/17/20  Talana Slatten A, PA-C    Allergies    Patient has no known allergies.  Review of Systems   Review of Systems  Constitutional: Negative.   HENT: Negative.   Respiratory: Negative.   Cardiovascular: Negative.   Gastrointestinal: Negative.   Genitourinary: Negative.   Musculoskeletal: Negative for gait problem.       Great toe pain  Skin: Negative.   Neurological: Negative.   All other systems reviewed and are negative.   Physical Exam Updated Vital Signs BP (!) 142/86 (BP Location: Right Arm)   Pulse 62   Temp 98.2 F (36.8 C) (Oral)   Resp 18   Ht 5\' 5"  (5.956 m)   Wt 79.8 kg   SpO2 98%   BMI 29.29 kg/m   Physical Exam Vitals and nursing note reviewed.  Constitutional:      General: She is not in acute distress.    Appearance: She is well-developed. She is not ill-appearing, toxic-appearing or diaphoretic.  HENT:     Head: Normocephalic and atraumatic.     Nose: Nose normal.     Mouth/Throat:     Mouth: Mucous membranes are moist.  Eyes:     Pupils: Pupils are equal, round, and reactive to light.  Cardiovascular:     Rate and Rhythm: Normal rate.     Pulses: Normal pulses.          Dorsalis pedis pulses are 2+ on the right side  and 2+ on the left side.     Heart sounds: Normal heart sounds.  Pulmonary:     Effort: Pulmonary effort is normal. No respiratory distress.     Breath sounds: Normal breath sounds.  Abdominal:     General: Bowel sounds are normal. There is no distension.  Musculoskeletal:        General: Normal range of motion.     Cervical back: Normal range of motion.  Right foot: Normal.     Left foot: Tenderness present.       Feet:     Comments: Tenderness surrounding base, first metatarsal on left lower extremity.  Full range of motion without difficulty.  No tenderness to proximal foot.  Compartments soft.  Bevelyn Buckles' sign negative.  No swelling surrounding toenail bed  Feet:     Right foot:     Skin integrity: Skin integrity normal.     Toenail Condition: Right toenails are abnormally thick.     Left foot:     Skin integrity: Skin integrity normal.     Toenail Condition: Left toenails are abnormally thick.     Comments: No ingrown toenail, paronychia Skin:    General: Skin is warm and dry.     Capillary Refill: Capillary refill takes less than 2 seconds.     Comments: No edema, erythema or warmth.  No fluctuance or induration.  Neurological:     General: No focal deficit present.     Mental Status: She is alert.     Cranial Nerves: Cranial nerves are intact.     Sensory: Sensation is intact.     Motor: Motor function is intact.     Gait: Gait is intact.     Comments: Ambulatory without difficulty Intact sensation    ED Results / Procedures / Treatments   Labs (all labs ordered are listed, but only abnormal results are displayed) Labs Reviewed - No data to display  EKG None  Radiology DG Toe Great Left  Result Date: 08/12/2020 CLINICAL DATA:  First toe pain for several days EXAM: LEFT GREAT TOE COMPARISON:  None. FINDINGS: There is no evidence of fracture or dislocation. There is no evidence of arthropathy or other focal bone abnormality. Soft tissues are unremarkable.  IMPRESSION: No acute abnormality noted. Electronically Signed   By: Inez Catalina M.D.   On: 08/12/2020 12:07    Procedures Procedures (including critical care time)  Medications Ordered in ED Medications  predniSONE (DELTASONE) tablet 20 mg (20 mg Oral Given 08/12/20 1143)    ED Course  I have reviewed the triage vital signs and the nursing notes.  Pertinent labs & imaging results that were available during my care of the patient were reviewed by me and considered in my medical decision making (see chart for details).   79 year old presents for evaluation of left great toe pain which began 4 days ago.  No recent injury or trauma.  She is afebrile, nonseptic, non-ill-appearing.  Was seen a few months ago for new onset gout.  On exam today patient has pain to the base of her first metatarsal on her left lower extremity.  No erythema, warmth.  No evidence of paronychia, ingrown toenails or evidence of bacterial infectious process.  No bony tenderness to proximal foot, tib-fib.  She is chronically anticoagulated for prior PEs and DVT.  No evidence of DVT on exam.  I have low suspicion for septic joint, hemarthrosis, compartment syndrome, VTE, infectious process.  Exam consistent with gout.  Her x-ray here does not show any evidence of effusion, fracture, dislocation or soft tissue swelling.  Does have history of CKD.  Given this and chronic anticoagulation will steer away from NSAIDs.  Will start on prednisone.  She is not diabetic.  Also given a short course of pain medicine.  She will call her PCP tomorrow for reevaluation.  The patient has been appropriately medically screened and/or stabilized in the ED. I have low suspicion for  any other emergent medical condition which would require further screening, evaluation or treatment in the ED or require inpatient management.  Patient is hemodynamically stable and in no acute distress.  Patient able to ambulate in department prior to ED.  Evaluation  does not show acute pathology that would require ongoing or additional emergent interventions while in the emergency department or further inpatient treatment.  I have discussed the diagnosis with the patient and answered all questions.  Pain is been managed while in the emergency department and patient has no further complaints prior to discharge.  Patient is comfortable with plan discussed in room and is stable for discharge at this time.  I have discussed strict return precautions for returning to the emergency department.  Patient was encouraged to follow-up with PCP/specialist refer to at discharge.   MDM Rules/Calculators/A&P                           Final Clinical Impression(s) / ED Diagnoses Final diagnoses:  Great toe pain, left    Rx / DC Orders ED Discharge Orders         Ordered    HYDROcodone-acetaminophen (NORCO/VICODIN) 5-325 MG tablet  Every 4 hours PRN        08/12/20 1240    predniSONE (DELTASONE) 20 MG tablet  Daily        08/12/20 1240           Mikayla Chiusano A, PA-C 08/12/20 1243    Milton Ferguson, MD 08/13/20 [phone]

## 2020-08-12 NOTE — Discharge Instructions (Signed)
Take the medications as prescribed, prednisone for the swelling.  Norco is used for pain.  Do not drive or operate heavy machinery while taking this medication as it may make you sleepy.  This medication may become addictive.  Follow up with your primary care provider to see if there is any medication you can go on that can prevent your recurrent gout.

## 2020-09-11 ENCOUNTER — Other Ambulatory Visit: Payer: Self-pay | Admitting: Family Medicine

## 2020-09-11 ENCOUNTER — Telehealth: Payer: Self-pay

## 2020-09-11 DIAGNOSIS — M810 Age-related osteoporosis without current pathological fracture: Secondary | ICD-10-CM

## 2020-09-11 MED ORDER — LISINOPRIL 10 MG PO TABS: 10.0000 mg | ORAL_TABLET | Freq: Every day | ORAL | 1 refills | Status: DC

## 2020-09-11 NOTE — Telephone Encounter (Signed)
Patient needing a refill on high blood pressure medication sent to [facility] in [place]

## 2020-09-11 NOTE — Telephone Encounter (Signed)
Lisinopril has been sent in. Thank you

## 2020-09-12 ENCOUNTER — Other Ambulatory Visit: Payer: Self-pay | Admitting: *Deleted

## 2020-09-12 ENCOUNTER — Telehealth: Payer: Self-pay

## 2020-09-12 MED ORDER — HYDROCHLOROTHIAZIDE 25 MG PO TABS
25.0000 mg | ORAL_TABLET | Freq: Every day | ORAL | 0 refills | Status: DC
Start: 2020-09-12 — End: 2020-12-03

## 2020-09-12 NOTE — Telephone Encounter (Signed)
Pt needs this called into [facility] [address] -[place] Hydrochlororothiazide-- 3 month supply  Pt wants auto refill if available     Pt states that she does not take Lisinsopril, pt states she thought Tereasa Coop took her off of this., Please check and advise.,

## 2020-09-13 NOTE — Telephone Encounter (Signed)
Yes, I have no record of stopping it. Maybe the kidney dr stopped it? It is on her active list and never listed as stopped on problem list.

## 2020-09-13 NOTE — Telephone Encounter (Signed)
Does the pt still take Lisinopril?

## 2020-09-13 NOTE — Telephone Encounter (Signed)
Left message

## 2020-09-14 ENCOUNTER — Telehealth: Payer: Self-pay | Admitting: *Deleted

## 2020-09-14 ENCOUNTER — Other Ambulatory Visit: Payer: Self-pay

## 2020-09-14 DIAGNOSIS — I10 Essential (primary) hypertension: Secondary | ICD-10-CM

## 2020-09-14 MED ORDER — LISINOPRIL 10 MG PO TABS: 10.0000 mg | ORAL_TABLET | Freq: Every day | ORAL | 1 refills | Status: DC

## 2020-09-14 NOTE — Telephone Encounter (Signed)
Pt informed

## 2020-09-14 NOTE — Telephone Encounter (Signed)
Pt informed, will continue Lisinopril along with the HCTZ

## 2020-09-14 NOTE — Telephone Encounter (Signed)
Pt called returning your call 

## 2020-09-16 ENCOUNTER — Encounter (HOSPITAL_COMMUNITY): Payer: Self-pay

## 2020-09-16 ENCOUNTER — Other Ambulatory Visit: Payer: Self-pay

## 2020-09-16 ENCOUNTER — Emergency Department (HOSPITAL_COMMUNITY)
Admission: EM | Admit: 2020-09-16 | Discharge: 2020-09-16 | Disposition: A | Discharge status: Home / Self Care | Payer: Medicare Other | Attending: Emergency Medicine | Admitting: Emergency Medicine

## 2020-09-16 ENCOUNTER — Emergency Department (HOSPITAL_COMMUNITY): Payer: Medicare Other

## 2020-09-16 DIAGNOSIS — M109 Gout, unspecified: Secondary | ICD-10-CM | POA: Diagnosis not present

## 2020-09-16 DIAGNOSIS — I129 Hypertensive chronic kidney disease with stage 1 through stage 4 chronic kidney disease, or unspecified chronic kidney disease: Secondary | ICD-10-CM | POA: Diagnosis not present

## 2020-09-16 DIAGNOSIS — M79671 Pain in right foot: Secondary | ICD-10-CM | POA: Diagnosis present

## 2020-09-16 DIAGNOSIS — Z79899 Other long term (current) drug therapy: Secondary | ICD-10-CM | POA: Insufficient documentation

## 2020-09-16 DIAGNOSIS — Z7901 Long term (current) use of anticoagulants: Secondary | ICD-10-CM | POA: Insufficient documentation

## 2020-09-16 DIAGNOSIS — N183 Chronic kidney disease, stage 3 unspecified: Secondary | ICD-10-CM | POA: Insufficient documentation

## 2020-09-16 MED ORDER — PREDNISONE 50 MG PO TABS
60.0000 mg | ORAL_TABLET | Freq: Once | ORAL | Status: AC
  Administered 2020-09-16: 60 mg via ORAL
  Filled 2020-09-16: qty 1

## 2020-09-16 MED ORDER — PREDNISONE 10 MG PO TABS: ORAL_TABLET | ORAL | 0 refills | Status: DC

## 2020-09-16 NOTE — Discharge Instructions (Addendum)
Your xray is negative for stress fracture, and I agree that this appears to be a flare of your gout.  Use elevation and gentle heat to the foot and ankle. Take your next dose of prednisone tomorrow morning.  Allopurinol may be a good medicine for you to take if you continue to have flares of gout to help prevent further attacks.  You can discuss this with your primary MD who may choose to start this daily medicine.

## 2020-09-16 NOTE — ED Provider Notes (Signed)
[facility] EMERGENCY DEPARTMENT Provider Note   CSN: [id number] Arrival date & time: 09/16/20  [id number]     History Chief Complaint  Patient presents with  . Foot Pain    Nicole Good is a 80 y.o. female with a history as outlined below including history of DVT and PE on chronic Eliquis, hypertension, along with recently diagnosed gout with presentation of right lateral ankle and foot pain and swelling which started 3 days ago and is consistent with her chronic gout pain.  She has applied heat and elevation and has taken Tylenol with no significant improvement in her symptoms.  She denies fevers or chills, has had no injuries to the foot, denies overuse.  She was seen here last month for a flare in her right great toe.  She was placed on prednisone and prescribed hydrocodone.  She states the prednisone worked great but she did not take the hydrocodone as she did not like its potential side effects.  She denies pain or swelling in either calf.  The history is provided by the patient.       Past Medical History:  Diagnosis Date  . Arthritis    "maybe in my right knee and hip" (12/03/2015)  . DVT (deep venous thrombosis) ([place]) 12/03/2015   LLE  . Heart murmur    "when I was a little girl; gone now"  . High cholesterol   . Hypertension   . Osteoporosis   . Prolapsed internal hemorrhoids, grade 3 07/04/2014  . Pulmonary emboli ([place]) ~ 2012  . Right arm weakness 10/30/2015    Patient Active Problem List   Diagnosis Date Noted  . Abnormal EKG 02/22/2020  . PVC's (premature ventricular contractions) 02/22/2020  . Current mild episode of major depressive disorder without prior episode ([place]) 02/22/2020  . Prediabetes 11/24/2019  . Post-menopausal 11/24/2019  . Fatigue 08/23/2019  . Stage 3b chronic kidney disease ([place]) 07/29/2019  . 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 Gastritis 07/27/2018  . Observed sleep apnea 06/02/2018  . Dry eye syndrome of both lacrimal  glands 12/06/2017  . 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 Pseudophakia of both eyes 12/06/2017  . Other pulmonary embolism without acute cor pulmonale ([place]) 12/04/2015  . Anticoagulant long-term use 12/04/2015  . DVT (deep venous thrombosis), left 12/03/2015  . Personal history of pulmonary embolism 12/03/2015  . H/O deep venous thrombosis 12/03/2015  . Osteoarthritis, knee 05/06/2012  . Overweight (BMI 25.0-29.9) 02/05/2012  . Essential hypertension 03/21/2011  . Hypertriglyceridemia 03/21/2011  . Multiple lipomas 03/21/2011  . Osteoporosis 03/21/2011    Past Surgical History:  Procedure Laterality Date  . ABDOMINAL HYSTERECTOMY  ~ 1979   "partial"  . CATARACT EXTRACTION W/ INTRAOCULAR LENS  IMPLANT, BILATERAL Bilateral 2017  . DILATION AND CURETTAGE OF UTERUS    . TONSILLECTOMY  1950     OB History    Gravida  3   Para  1   Term  1   Preterm      AB  2   Living  1     SAB  1   IAB      Ectopic  1   Multiple      Live Births              Family History  Problem Relation Age of Onset  . Stroke Mother   . Heart disease Mother   . Hypertension Maternal Grandmother     Social History   Tobacco  Use  . Smoking status: Never Smoker  . Smokeless tobacco: Never Used  Vaping Use  . Vaping Use: Never used  Substance Use Topics  . Alcohol use: No  . Drug use: No    Home Medications Prior to Admission medications   Medication Sig Start Date End Date Taking? Authorizing Provider  apixaban (ELIQUIS) 2.5 MG TABS tablet Take 1 tablet (2.5 mg total) by mouth 2 (two) times daily. 02/15/20   Perlie Mayo, NP  Calcium 500 MG tablet Take 1 tablet (500 mg total) by mouth 3 (three) times daily with meals. 02/15/20   Perlie Mayo, NP  Cholecalciferol (VITAMIN D3) 1.25 MG (50000 UT) CAPS Take 1 capsule by mouth daily.    [provider]  hydrochlorothiazide (HYDRODIURIL) 25 MG tablet Take 1 tablet (25 mg total) by mouth daily. 09/12/20   Perlie Mayo, NP  HYDROcodone-acetaminophen (NORCO/VICODIN) 5-325 MG tablet Take 1 tablet by mouth every 4 (four) hours as needed. 08/12/20   Henderly, Britni A, PA-C  lisinopril (ZESTRIL) 10 MG tablet Take 1 tablet (10 mg total) by mouth daily. 09/14/20   Perlie Mayo, NP  Multiple Vitamins-Minerals (MULTIVITAMINS THER. W/MINERALS) TABS Take 1 tablet by mouth every morning.     [provider]  omega-3 acid ethyl esters (LOVAZA) 1 g capsule Take 1 capsule by mouth 2 (two) times daily. 11/30/19   [provider]  pantoprazole (PROTONIX) 40 MG tablet Take 40 mg by mouth daily.    [provider]    Allergies    Patient has no known allergies.  Review of Systems   Review of Systems  Constitutional: Negative for chills and fever.  HENT: Negative.   Respiratory: Negative.   Cardiovascular: Negative.   Gastrointestinal: Negative.   Musculoskeletal: Positive for arthralgias and joint swelling.  Skin: Negative.  Negative for color change, rash and wound.  Neurological: Negative for weakness and numbness.    Physical Exam Updated Vital Signs BP 139/86 (BP Location: Right Arm)   Pulse 62   Temp 98.1 F (36.7 C) (Oral)   Resp 16   Ht 5' 5.5" (1.664 m)   Wt 80.7 kg   SpO2 100%   BMI 29.17 kg/m   Physical Exam Constitutional:      Appearance: She is well-developed and well-nourished.  HENT:     Head: Normocephalic and atraumatic.  Cardiovascular:     Rate and Rhythm: Normal rate.     Pulses: Normal pulses.     Comments: Pulses equal bilaterally Pulmonary:     Effort: Pulmonary effort is normal. No respiratory distress.  Musculoskeletal:        General: Tenderness present.     Comments: Bilateral calves soft, nontender. Negative Homans.  Skin:    General: Skin is warm and dry.     Findings: No erythema.     Comments: Mild increased warmth at right lateral anterior malleolus to lateral mid foot with edema and tenderness.  Pt can flex/ext ankle and toes  with mild discomfort.  No palpable deformity. Skin intact, without injury or lesion.   Neurological:     Mental Status: She is alert and oriented to person, place, and time.     Sensory: No sensory deficit.     Deep Tendon Reflexes: Strength normal. Reflexes normal.  Psychiatric:        Mood and Affect: Mood and affect normal.     ED Results / Procedures / Treatments   Labs (all labs ordered  are listed, but only abnormal results are displayed) Labs Reviewed - No data to display  EKG None  Radiology DG Foot Complete Right  Result Date: 09/16/2020 CLINICAL DATA:  Pain EXAM: RIGHT FOOT COMPLETE - 3+ VIEW COMPARISON:  April 04, 2020 FINDINGS: Osteopenia. No acute fracture or dislocation. Mild degenerative changes of the first MTP with subcortical cyst formation. No new marginal erosion of the first MTP. Unchanged contour of the lateral margin of the base of the first phalanx. No unexpected radiopaque foreign body. Soft tissues are unremarkable. IMPRESSION: Mild degenerative changes of the first MTP. No new marginal erosion of the first MTP. Electronically Signed   By: Valentino Saxon MD   On: 09/16/2020 11:38    Procedures Procedures (including critical care time)  Medications Ordered in ED Medications  predniSONE (DELTASONE) tablet 60 mg (60 mg Oral Given 09/16/20 1139)    ED Course  I have reviewed the triage vital signs and the nursing notes.  Pertinent labs & imaging results that were available during my care of the patient were reviewed by me and considered in my medical decision making (see chart for details).    MDM Rules/Calculators/A&P                          Patient with exam and history suggesting acute her gout.  She was placed back on a prednisone taper with first dose given here.  She does have hydrocodone at home which she did not take with her previous flare, encouraged to add this if her pain is not significantly improving over the next day or 2, however also  advise close follow-up with her PCP if this prednisone taper does not completely resolve this episode.  Her x-ray was reviewed and discussed with her, she does not have a compression fracture as a source of this pain.  There is no evidence for septic joint or skin infection/cellulitis.  Other home treatments discussed including elevation and heat. Final Clinical Impression(s) / ED Diagnoses Final diagnoses:  Acute gout of right foot, unspecified cause    Rx / DC Orders ED Discharge Orders    None       Landis Martins 09/16/20 1201    Isla Pence, MD 09/17/20 1900

## 2020-09-16 NOTE — ED Triage Notes (Signed)
Pt presents to ED with complaints of right foot pain since Thursday night. Pt states feels like gout flare up.

## 2020-10-25 ENCOUNTER — Other Ambulatory Visit: Payer: Self-pay

## 2020-10-25 ENCOUNTER — Ambulatory Visit (INDEPENDENT_AMBULATORY_CARE_PROVIDER_SITE_OTHER): Payer: Medicare Other | Admitting: Internal Medicine

## 2020-10-25 ENCOUNTER — Encounter: Payer: Self-pay | Admitting: Internal Medicine

## 2020-10-25 VITALS — BP 138/87 | HR 76 | Temp 98.9°F | Resp 18 | Ht 65.5 in | Wt 184.4 lb

## 2020-10-25 DIAGNOSIS — R5383 Other fatigue: Secondary | ICD-10-CM

## 2020-10-25 DIAGNOSIS — I10 Essential (primary) hypertension: Secondary | ICD-10-CM

## 2020-10-25 DIAGNOSIS — N1832 Chronic kidney disease, stage 3b: Secondary | ICD-10-CM

## 2020-10-25 MED ORDER — LOSARTAN POTASSIUM 50 MG PO TABS: 50.0000 mg | ORAL_TABLET | Freq: Every day | ORAL | 1 refills | Status: DC

## 2020-10-25 NOTE — Progress Notes (Signed)
Established Patient Office Visit  Subjective:  Patient ID: Nicole Good, female    DOB: 04/26/1941  Age: 80 y.o. MRN: [id number]  CC:  Chief Complaint  Patient presents with  . Follow-up    3-4 month follow up pt has not been feeling herself stays very tired and fatigued all the time     HPI Nicole Good is a 80 year old female with PMH of HTN, CKD stage 3b, DVT/PE, prediabetes and osteoporosis who presents for follow up of her chronic medical conditions.  She has been feeling less energetic lately. She denies anhedonia or sleep problems. She is happy with her home situation. She states that she feels somewhat anxious and compares it to the feeling of unprepared exam timing. Denies any nausea, abdominal pain, weight loss or night sweats.  She has h/o CKD stage 3b, for which she follows up with Nephrologist.  She stopped taking Lisinopril as she is having cough. Her BP is mildly elevated today. She denies any headache, dizziness, dyspnea, chest pain or palpitations.  Past Medical History:  Diagnosis Date  . Arthritis    "maybe in my right knee and hip" (12/03/2015)  . DVT (deep venous thrombosis) ([place]) 12/03/2015   LLE  . Heart murmur    "when I was a little girl; gone now"  . High cholesterol   . Hypertension   . Osteoporosis   . Prolapsed internal hemorrhoids, grade 3 07/04/2014  . Pulmonary emboli ([place]) ~ 2012  . Right arm weakness 10/30/2015    Past Surgical History:  Procedure Laterality Date  . ABDOMINAL HYSTERECTOMY  ~ 1979   "partial"  . CATARACT EXTRACTION W/ INTRAOCULAR LENS  IMPLANT, BILATERAL Bilateral 2017  . DILATION AND CURETTAGE OF UTERUS    . TONSILLECTOMY  1950    Family History  Problem Relation Age of Onset  . Stroke Mother   . Heart disease Mother   . Hypertension Maternal Grandmother     Social History   Socioeconomic History  . Marital status: Married    Spouse name: Not on file  . Number of children: 1  . Years of education: MA   . Highest education level: Not on file  Occupational History  . Occupation: retired    Comment: middle school principle   Tobacco Use  . Smoking status: Never Smoker  . Smokeless tobacco: Never Used  Vaping Use  . Vaping Use: Never used  Substance and Sexual Activity  . Alcohol use: No  . Drug use: No  . Sexual activity: Never  Other Topics Concern  . Not on file  Social History Narrative   Lives with husband-married 34 years       1 son- no grandkids    Right handed       Enjoys: reads, cleaned and organized, piano       Diet: eats all foods groups and tries to balanced diet well-occasionally hot dog and red meat   Caffeine: hot chocolate, green tea   Water: 3 liters      Exercise 55 minutes 5 days of week       Wears seat belt   Smoke detectors at home   Does not use phone while driving   Social Determinants of Health   Financial Resource Strain: Not on file  Food Insecurity: Not on file  Transportation Needs: Not on file  Physical Activity: Not on file  Stress: Not on file  Social Connections: Not on file  Intimate Partner Violence: Not  on file    Outpatient Medications Prior to Visit  Medication Sig Dispense Refill  . apixaban (ELIQUIS) 2.5 MG TABS tablet Take 1 tablet (2.5 mg total) by mouth 2 (two) times daily. 60 tablet 3  . Calcium 500 MG tablet Take 1 tablet (500 mg total) by mouth 3 (three) times daily with meals. 90 tablet 3  . Cholecalciferol (VITAMIN D3) 1.25 MG (50000 UT) CAPS Take 1 capsule by mouth daily.    . hydrochlorothiazide (HYDRODIURIL) 25 MG tablet Take 1 tablet (25 mg total) by mouth daily. 90 tablet 0  . Multiple Vitamins-Minerals (MULTIVITAMINS THER. W/MINERALS) TABS Take 1 tablet by mouth every morning.    Marland Kitchen omega-3 acid ethyl esters (LOVAZA) 1 g capsule Take 1 capsule by mouth 2 (two) times daily.    Loma Boston Calcium 500 MG TABS TAKE 1 TABLET (500 MG TOTAL) BY MOUTH 3 (THREE) TIMES DAILY WITH MEALS. 270 tablet 1  . pantoprazole  (PROTONIX) 40 MG tablet Take 40 mg by mouth daily.    Marland Kitchen HYDROcodone-acetaminophen (NORCO/VICODIN) 5-325 MG tablet Take 1 tablet by mouth every 4 (four) hours as needed. 10 tablet 0  . lisinopril (ZESTRIL) 10 MG tablet Take 1 tablet (10 mg total) by mouth daily. (Patient not taking: Reported on 10/25/2020) 90 tablet 1  . predniSONE (DELTASONE) 10 MG tablet Take 6 tablets day one, 5 tablets day two, 4 tablets day three, 3 tablets day four, 2 tablets day five, then 1 tablet day six (Patient not taking: Reported on 10/25/2020) 21 tablet 0   No facility-administered medications prior to visit.    No Known Allergies  ROS Review of Systems  Constitutional: Positive for fatigue. Negative for activity change, appetite change, chills and fever.  HENT: Negative for congestion, sinus pressure, sinus pain and sore throat.   Eyes: Negative for pain and discharge.  Respiratory: Positive for cough. Negative for shortness of breath.   Cardiovascular: Negative for chest pain and palpitations.  Gastrointestinal: Negative for abdominal pain, constipation, diarrhea, nausea and vomiting.  Endocrine: Negative for polydipsia and polyuria.  Genitourinary: Negative for dysuria and hematuria.  Musculoskeletal: Negative for neck pain and neck stiffness.  Skin: Negative for rash.  Neurological: Negative for dizziness and weakness.  Psychiatric/Behavioral: Negative for agitation and behavioral problems.      Objective:    Physical Exam Vitals reviewed.  Constitutional:      General: She is not in acute distress.    Appearance: She is not diaphoretic.  HENT:     Head: Normocephalic and atraumatic.     Nose: Nose normal. No congestion.     Mouth/Throat:     Mouth: Mucous membranes are moist.     Pharynx: No posterior oropharyngeal erythema.  Eyes:     General: No scleral icterus.    Extraocular Movements: Extraocular movements intact.     Pupils: Pupils are equal, round, and reactive to light.   Cardiovascular:     Rate and Rhythm: Normal rate and regular rhythm.     Pulses: Normal pulses.     Heart sounds: Normal heart sounds. No murmur heard.   Pulmonary:     Breath sounds: Normal breath sounds. No wheezing or rales.  Musculoskeletal:     Cervical back: Neck supple. No tenderness.     Right lower leg: No edema.     Left lower leg: No edema.  Skin:    General: Skin is warm.     Findings: No rash.  Neurological:  General: No focal deficit present.     Mental Status: She is alert and oriented to person, place, and time.  Psychiatric:        Mood and Affect: Mood normal.        Behavior: Behavior normal.     BP 138/87 (BP Location: Right Arm, Patient Position: Sitting, Cuff Size: Normal)   Pulse 76   Temp 98.9 F (37.2 C) (Oral)   Resp 18   Ht 5' 5.5" (1.664 m)   Wt 184 lb 6.4 oz (83.6 kg)   SpO2 98%   BMI 30.22 kg/m  Wt Readings from Last 3 Encounters:  10/25/20 184 lb 6.4 oz (83.6 kg)  09/16/20 178 lb (80.7 kg)  08/12/20 176 lb (79.8 kg)     Health Maintenance Due  Topic Date Due  . COVID-19 Vaccine (1) Never done    There are no preventive care reminders to display for this patient.  Lab Results  Component Value Date   TSH 0.46 02/14/2020   Lab Results  Component Value Date   WBC 6.5 02/14/2020   HGB 13.7 02/14/2020   HCT 41.3 02/14/2020   MCV 89.6 02/14/2020   PLT 221 02/14/2020   Lab Results  Component Value Date   NA 140 02/14/2020   K 4.7 02/14/2020   CO2 28 02/14/2020   GLUCOSE 91 02/14/2020   BUN 32 (H) 02/14/2020   CREATININE 1.66 (H) 02/14/2020   BILITOT 0.3 02/14/2020   ALKPHOS 70 12/03/2015   AST 17 02/14/2020   ALT 20 02/14/2020   PROT 7.4 02/14/2020   ALBUMIN 4.4 12/03/2015   CALCIUM 10.5 (H) 02/14/2020   ANIONGAP 8 11/16/2018   Lab Results  Component Value Date   CHOL 174 11/29/2019   Lab Results  Component Value Date   HDL 36 (L) 11/29/2019   Lab Results  Component Value Date   LDLCALC 111 (H)  11/29/2019   Lab Results  Component Value Date   TRIG 159 (H) 11/29/2019   Lab Results  Component Value Date   CHOLHDL 4.8 11/29/2019   Lab Results  Component Value Date   HGBA1C 6.1 (H) 11/29/2019      Assessment & Plan:   Problem List Items Addressed This Visit      Cardiovascular and Mediastinum   Essential hypertension - Primary BP Readings from Last 1 Encounters:  10/25/20 138/87   Mildly elevated with HCTZ Discontinued Lisinopril due to cough, switched to Losartan Counseled for compliance with the medications Advised DASH diet and moderate exercise/walking, at least 150 mins/week    Relevant Medications   losartan (COZAAR) 50 MG tablet     Genitourinary   Stage 3b chronic kidney disease (HCC) No significant proteinuria according to Nephrology evaluation On calcium and Vitamin D supplements Avoid nephrotoxic agents     Other   Fatigue Unclear etiology Age-related vs underlying CKD vs anxiety Advised to stay hydrated, avoid skipping any meal CBC and BMP reviewed from Nephrology visit note Check CBC, BMP and TSH in the next visit Engage in activities of liking. If persistent symptoms, can give a trial of SSRI.      Meds ordered this encounter  Medications  . losartan (COZAAR) 50 MG tablet    Sig: Take 1 tablet (50 mg total) by mouth daily.    Dispense:  90 tablet    Refill:  1    Follow-up: Return in about 4 months (around 02/22/2021) for Annual physical.    Lindell Spar, MD

## 2020-10-25 NOTE — Patient Instructions (Addendum)
Please start taking Losartan instead of Lisinopril. Please continue to take other medications as prescribed.  Please follow low sodium diet and perform moderate exercise/walking as tolerated.  Please take around 60 ounces of fluid in a day.

## 2020-10-30 ENCOUNTER — Ambulatory Visit: Admitting: Family Medicine

## 2020-11-09 ENCOUNTER — Other Ambulatory Visit (HOSPITAL_COMMUNITY)
Admission: RE | Admit: 2020-11-09 | Discharge: 2020-11-09 | Disposition: A | Discharge status: Home / Self Care | Payer: Medicare Other | Source: Ambulatory Visit | Attending: Nephrology | Admitting: Nephrology

## 2020-11-09 DIAGNOSIS — N1832 Chronic kidney disease, stage 3b: Secondary | ICD-10-CM | POA: Diagnosis present

## 2020-11-09 DIAGNOSIS — I129 Hypertensive chronic kidney disease with stage 1 through stage 4 chronic kidney disease, or unspecified chronic kidney disease: Secondary | ICD-10-CM | POA: Diagnosis present

## 2020-11-09 DIAGNOSIS — E211 Secondary hyperparathyroidism, not elsewhere classified: Secondary | ICD-10-CM | POA: Diagnosis present

## 2020-11-09 DIAGNOSIS — D472 Monoclonal gammopathy: Secondary | ICD-10-CM | POA: Insufficient documentation

## 2020-11-09 DIAGNOSIS — I5032 Chronic diastolic (congestive) heart failure: Secondary | ICD-10-CM | POA: Diagnosis present

## 2020-11-09 LAB — RENAL FUNCTION PANEL
Albumin: 3.9 g/dL (ref 3.5–5.0)
Anion gap: 6 (ref 5–15)
BUN: 35 mg/dL — ABNORMAL HIGH (ref 8–23)
CO2: 23 mmol/L (ref 22–32)
Calcium: 9.1 mg/dL (ref 8.9–10.3)
Chloride: 106 mmol/L (ref 98–111)
Creatinine, Ser: 1.62 mg/dL — ABNORMAL HIGH (ref 0.44–1.00)
GFR, Estimated: 32 mL/min — ABNORMAL LOW (ref >60)
Glucose, Bld: 117 mg/dL — ABNORMAL HIGH (ref 70–99)
Phosphorus: 2.6 mg/dL (ref 2.5–4.6)
Potassium: 4.2 mmol/L (ref 3.5–5.1)
Sodium: 135 mmol/L (ref 135–145)

## 2020-12-03 ENCOUNTER — Other Ambulatory Visit: Payer: Self-pay | Admitting: Family Medicine

## 2020-12-17 ENCOUNTER — Other Ambulatory Visit: Payer: Self-pay | Admitting: Family Medicine

## 2021-01-15 ENCOUNTER — Encounter (HOSPITAL_COMMUNITY): Payer: Self-pay | Admitting: Hematology

## 2021-01-15 ENCOUNTER — Telehealth: Payer: Self-pay | Admitting: Family Medicine

## 2021-01-15 ENCOUNTER — Other Ambulatory Visit: Payer: Self-pay

## 2021-01-15 ENCOUNTER — Inpatient Hospital Stay (HOSPITAL_COMMUNITY): Payer: Medicare Other | Attending: Hematology | Admitting: Hematology

## 2021-01-15 ENCOUNTER — Inpatient Hospital Stay (HOSPITAL_COMMUNITY): Payer: Medicare Other

## 2021-01-15 VITALS — BP 165/89 | HR 53 | Temp 97.3°F | Resp 18 | Ht 65.5 in | Wt 181.6 lb

## 2021-01-15 DIAGNOSIS — Z8249 Family history of ischemic heart disease and other diseases of the circulatory system: Secondary | ICD-10-CM | POA: Insufficient documentation

## 2021-01-15 DIAGNOSIS — I129 Hypertensive chronic kidney disease with stage 1 through stage 4 chronic kidney disease, or unspecified chronic kidney disease: Secondary | ICD-10-CM | POA: Diagnosis not present

## 2021-01-15 DIAGNOSIS — Z823 Family history of stroke: Secondary | ICD-10-CM | POA: Insufficient documentation

## 2021-01-15 DIAGNOSIS — H02409 Unspecified ptosis of unspecified eyelid: Secondary | ICD-10-CM | POA: Diagnosis not present

## 2021-01-15 DIAGNOSIS — Z833 Family history of diabetes mellitus: Secondary | ICD-10-CM | POA: Diagnosis not present

## 2021-01-15 DIAGNOSIS — D472 Monoclonal gammopathy: Secondary | ICD-10-CM | POA: Diagnosis present

## 2021-01-15 DIAGNOSIS — N1832 Chronic kidney disease, stage 3b: Secondary | ICD-10-CM | POA: Insufficient documentation

## 2021-01-15 DIAGNOSIS — R200 Anesthesia of skin: Secondary | ICD-10-CM | POA: Insufficient documentation

## 2021-01-15 DIAGNOSIS — Z79899 Other long term (current) drug therapy: Secondary | ICD-10-CM | POA: Diagnosis not present

## 2021-01-15 DIAGNOSIS — Z7901 Long term (current) use of anticoagulants: Secondary | ICD-10-CM | POA: Diagnosis not present

## 2021-01-15 LAB — CBC WITH DIFFERENTIAL/PLATELET
Abs Immature Granulocytes: 0.01 10*3/uL (ref 0.00–0.07)
Basophils Absolute: 0 10*3/uL (ref 0.0–0.1)
Basophils Relative: 1 %
Eosinophils Absolute: 0.3 10*3/uL (ref 0.0–0.5)
Eosinophils Relative: 4 %
HCT: 41.5 % (ref 36.0–46.0)
Hemoglobin: 13 g/dL (ref 12.0–15.0)
Immature Granulocytes: 0 %
Lymphocytes Relative: 26 %
Lymphs Abs: 1.5 10*3/uL (ref 0.7–4.0)
MCH: 29.7 pg (ref 26.0–34.0)
MCHC: 31.3 g/dL (ref 30.0–36.0)
MCV: 94.7 fL (ref 80.0–100.0)
Monocytes Absolute: 0.4 10*3/uL (ref 0.1–1.0)
Monocytes Relative: 8 %
Neutro Abs: 3.5 10*3/uL (ref 1.7–7.7)
Neutrophils Relative %: 61 %
Platelets: 214 10*3/uL (ref 150–400)
RBC: 4.38 MIL/uL (ref 3.87–5.11)
RDW: 13.3 % (ref 11.5–15.5)
WBC: 5.7 10*3/uL (ref 4.0–10.5)
nRBC: 0 % (ref 0.0–0.2)

## 2021-01-15 LAB — LACTATE DEHYDROGENASE: LDH: 159 U/L (ref 98–192)

## 2021-01-15 LAB — COMPREHENSIVE METABOLIC PANEL
ALT: 33 U/L (ref 0–44)
AST: 32 U/L (ref 15–41)
Albumin: 4.5 g/dL (ref 3.5–5.0)
Alkaline Phosphatase: 55 U/L (ref 38–126)
Anion gap: 6 (ref 5–15)
BUN: 25 mg/dL — ABNORMAL HIGH (ref 8–23)
CO2: 26 mmol/L (ref 22–32)
Calcium: 9.9 mg/dL (ref 8.9–10.3)
Chloride: 105 mmol/L (ref 98–111)
Creatinine, Ser: 1.39 mg/dL — ABNORMAL HIGH (ref 0.44–1.00)
GFR, Estimated: 38 mL/min — ABNORMAL LOW (ref >60)
Glucose, Bld: 87 mg/dL (ref 70–99)
Potassium: 4.1 mmol/L (ref 3.5–5.1)
Sodium: 137 mmol/L (ref 135–145)
Total Bilirubin: 0.4 mg/dL (ref 0.3–1.2)
Total Protein: 8 g/dL (ref 6.5–8.1)

## 2021-01-15 NOTE — Telephone Encounter (Signed)
Left message for patient to call back and schedule Medicare Annual Wellness Visit (AWV) either virtually or in office.   AWV-I PER [place] 05/10/2019 please schedule at anytime with [facility]  health coach  This should be a 40 minute visit.

## 2021-01-15 NOTE — Patient Instructions (Addendum)
[place] at [facility] Discharge Instructions  You were seen and examined today by Dr. Delton Coombes. Dr. Delton Coombes specializes both in blood conditions and cancer diagnoses. Dr. Delton Coombes discussed your past medical history, family history of cancer or blood disorders, and the events that led to you being here today.  You were referred to Dr. Delton Coombes due to the presence of an abnormal protein in your urine. This could be associated with a blood disordered known as MGUS. Additional information can be found at [facility] website. This is cancer. Dr. Delton Coombes has recommended additional lab work to identify the cause of this abnormal protein. Dr. Delton Coombes has also recommended an X-Ray of your bones for a baseline.  Dr. Delton Coombes will see you back in the clinic to discuss these results.   Thank you for choosing [place] at [facility] to provide your oncology and hematology care.  To afford each patient quality time with our provider, please arrive at least 15 minutes before your scheduled appointment time.   If you have a lab appointment with the [facility] please come in thru the Main Entrance and check in at the main information desk  You need to re-schedule your appointment should you arrive 10 or more minutes late.  We strive to give you quality time with our providers, and arriving late affects you and other patients whose appointments are after yours.  Also, if you no show three or more times for appointments you may be dismissed from the clinic at the providers discretion.     Again, thank you for choosing [facility].  Our hope is that these requests will decrease the amount of time that you wait before being seen by our physicians.       _____________________________________________________________  Should you have questions after your visit to [facility], please contact our office at [phone]  between the hours of 8:00 a.m. and 4:30 p.m.  Voicemails left after 4:00 p.m. will not be returned until the following business day.  For prescription refill requests, have your pharmacy contact our office and allow 72 hours.    Cancer Center Support Programs:   > Cancer Support Group  2nd Tuesday of the month 1pm-2pm, Journey Room

## 2021-01-15 NOTE — Progress Notes (Signed)
[place], [place] [address]   CLINIC:  Medical Oncology/Hematology  Patient Care Team: Perlie Mayo, NP as PCP - General (Family Medicine)  CHIEF COMPLAINTS/PURPOSE OF CONSULTATION:  Evaluation of MGUS  HISTORY OF PRESENTING ILLNESS:  Nicole Good 80 y.o. female is here because of evaluation of MGUS, at the request of Dr. Ulice Bold.  Her urine immunofixation showed IgA lambda monoclonal immunoglobulin on 10/22/2020.  Today she reports feeling well. She denies fever, night sweat, new bone pain, and weight loss. She reports no new health changes. She reports eyelid drooping after procedure to lift it at [facility] 2 years ago. She reports 2 prior episodes of Gout. She reports numbness in her hands several days ago for one episode only. She denies any prior heart issues, strokes or cancers.     She lives with her husband and used to be a Patent examiner. She has never smoked, and she denies a history of cancer in her family, but there is a family Hx of high BP and DM. Her maternal cousin had multiple myeloma. She denies chemical exposure.   MEDICAL HISTORY:  Past Medical History:  Diagnosis Date  . Arthritis    "maybe in my right knee and hip" (12/03/2015)  . DVT (deep venous thrombosis) ([place]) 12/03/2015   LLE  . Heart murmur    "when I was a little girl; gone now"  . High cholesterol   . Hypertension   . Osteoporosis   . Prolapsed internal hemorrhoids, grade 3 07/04/2014  . Pulmonary emboli ([place]) ~ 2012  . Right arm weakness 10/30/2015    SURGICAL HISTORY: Past Surgical History:  Procedure Laterality Date  . ABDOMINAL HYSTERECTOMY  ~ 1979   "partial"  . CATARACT EXTRACTION W/ INTRAOCULAR LENS  IMPLANT, BILATERAL Bilateral 2017  . DILATION AND CURETTAGE OF UTERUS    . TONSILLECTOMY  1950    SOCIAL HISTORY: Social History   Socioeconomic History  . Marital status: Married    Spouse name: Not on file  . Number of  children: 1  . Years of education: MA  . Highest education level: Not on file  Occupational History  . Occupation: retired    Comment: middle school principle   Tobacco Use  . Smoking status: Never Smoker  . Smokeless tobacco: Never Used  Vaping Use  . Vaping Use: Never used  Substance and Sexual Activity  . Alcohol use: No  . Drug use: No  . Sexual activity: Never  Other Topics Concern  . Not on file  Social History Narrative   Lives with husband-married 10 years       1 son- no grandkids    Right handed       Enjoys: reads, cleaned and organized, piano       Diet: eats all foods groups and tries to balanced diet well-occasionally hot dog and red meat   Caffeine: hot chocolate, green tea   Water: 3 liters      Exercise 55 minutes 5 days of week       Wears seat belt   Smoke detectors at home   Does not use phone while driving   Social Determinants of Health   Financial Resource Strain: Low Risk   . Difficulty of Paying Living Expenses: Not hard at all  Food Insecurity: Not on file  Transportation Needs: No Transportation Needs  . Lack of Transportation (Medical): No  . Lack of Transportation (Non-Medical): No  Physical Activity: Sufficiently Active  . Days of Exercise per Week: 4 days  . Minutes of Exercise per Session: 40 min  Stress: No Stress Concern Present  . Feeling of Stress : Not at all  Social Connections: Socially Integrated  . Frequency of Communication with Friends and Family: More than three times a week  . Frequency of Social Gatherings with Friends and Family: More than three times a week  . Attends Religious Services: More than 4 times per year  . Active Member of Clubs or Organizations: No  . Attends Archivist Meetings: 1 to 4 times per year  . Marital Status: Married  Human resources officer Violence: Not At Risk  . Fear of Current or Ex-Partner: No  . Emotionally Abused: No  . Physically Abused: No  . Sexually Abused: No    FAMILY  HISTORY: Family History  Problem Relation Age of Onset  . Stroke Mother   . Heart disease Mother   . Hypertension Maternal Grandmother     ALLERGIES:  has No Known Allergies.  MEDICATIONS:  Current Outpatient Medications  Medication Sig Dispense Refill  . apixaban (ELIQUIS) 2.5 MG TABS tablet Take 1 tablet (2.5 mg total) by mouth 2 (two) times daily. 60 tablet 3  . Calcium 500 MG tablet Take 1 tablet (500 mg total) by mouth 3 (three) times daily with meals. 90 tablet 3  . Calcium Carb-Cholecalciferol (OYSTER SHELL CALCIUM W/D) 500-200 MG-UNIT TABS Take 1 tablet by mouth 3 (three) times daily.    . Cholecalciferol (VITAMIN D3) 1.25 MG (50000 UT) CAPS Take 1 capsule by mouth daily.    . hydrochlorothiazide (HYDRODIURIL) 25 MG tablet TAKE 1 TABLET (25 MG TOTAL) BY MOUTH DAILY. 90 tablet 0  . hydrochlorothiazide (HYDRODIURIL) 25 MG tablet Take 1 tablet by mouth daily.    Marland Kitchen lisinopril (ZESTRIL) 10 MG tablet Take 1 tablet by mouth daily.    Marland Kitchen losartan (COZAAR) 50 MG tablet Take 1 tablet (50 mg total) by mouth daily. 90 tablet 1  . Multiple Vitamins-Minerals (MULTIVITAMINS THER. W/MINERALS) TABS Take 1 tablet by mouth every morning.    Marland Kitchen omega-3 acid ethyl esters (LOVAZA) 1 g capsule Take 1 capsule by mouth 2 (two) times daily.    Loma Boston Calcium 500 MG TABS TAKE 1 TABLET (500 MG TOTAL) BY MOUTH 3 (THREE) TIMES DAILY WITH MEALS. 270 tablet 1  . pantoprazole (PROTONIX) 40 MG tablet Take 40 mg by mouth daily.    . rosuvastatin (CRESTOR) 10 MG tablet Take 1 tablet by mouth daily.     No current facility-administered medications for this visit.    REVIEW OF SYSTEMS:   Review of Systems  Constitutional: Positive for fatigue (50%). Negative for appetite change.  All other systems reviewed and are negative.    PHYSICAL EXAMINATION: ECOG PERFORMANCE STATUS: 1 - Symptomatic but completely ambulatory  Vitals:   01/15/21 1241  BP: (!) 165/89  Pulse: (!) 53  Resp: 18  Temp: (!) 97.3  F (36.3 C)  SpO2: 98%   Filed Weights   01/15/21 1241  Weight: 181 lb 9.6 oz (82.4 kg)   Physical Exam Vitals reviewed.  Constitutional:      Appearance: Normal appearance.  Eyes:     Comments: Drooping right eyelid  Cardiovascular:     Rate and Rhythm: Normal rate and regular rhythm.     Pulses: Normal pulses.     Heart sounds: Normal heart sounds.  Pulmonary:     Effort: Pulmonary effort  is normal.     Breath sounds: Normal breath sounds.  Chest:  Breasts:     Right: No axillary adenopathy.     Left: No axillary adenopathy.    Abdominal:     Palpations: Abdomen is soft. There is no hepatomegaly, splenomegaly or mass.     Tenderness: There is no abdominal tenderness.     Hernia: No hernia is present.  Musculoskeletal:     Right lower leg: No edema.     Left lower leg: No edema.  Lymphadenopathy:     Cervical: No cervical adenopathy.     Upper Body:     Right upper body: No axillary or pectoral adenopathy.     Left upper body: No axillary or pectoral adenopathy.  Neurological:     General: No focal deficit present.     Mental Status: She is alert and oriented to person, place, and time.  Psychiatric:        Mood and Affect: Mood normal.        Behavior: Behavior normal.      LABORATORY DATA:  I have reviewed the data as listed Recent Results (from the past 2160 hour(s))  Renal function panel     Status: Abnormal   Collection Time: 11/09/20 12:37 PM  Result Value Ref Range   Sodium 135 135 - 145 mmol/L   Potassium 4.2 3.5 - 5.1 mmol/L   Chloride 106 98 - 111 mmol/L   CO2 23 22 - 32 mmol/L   Glucose, Bld 117 (H) 70 - 99 mg/dL    Comment: Glucose reference range applies only to samples taken after fasting for at least 8 hours.   BUN 35 (H) 8 - 23 mg/dL   Creatinine, Ser 1.62 (H) 0.44 - 1.00 mg/dL   Calcium 9.1 8.9 - 10.3 mg/dL   Phosphorus 2.6 2.5 - 4.6 mg/dL   Albumin 3.9 3.5 - 5.0 g/dL   GFR, Estimated 32 (L) >60 mL/min    Comment: (NOTE) Calculated  using the CKD-EPI Creatinine Equation (2021)    Anion gap 6 5 - 15    Comment: Performed at [facility], [address] [place], [place] [address]    RADIOGRAPHIC STUDIES: I have personally reviewed the radiological images as listed and agreed with the findings in the report. No results found.  ASSESSMENT:  1.  IgA Lambda MGUS: - Patient seen at the request of Dr. Theador Hawthorne. - Labs done at his office on 10/22/2020 for work-up of CKD showed faint IgA lambda monoclonal immunoglobulin in urine. - She denies any fevers, night sweats or weight loss.  No new bone lesions.  No neuropathy symptoms.  No recurrent infections noted.  2.  Social/family history: - She lives at home with her husband. 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  Non-smoker.  No exposure to chemicals. -Maternal cousin had myeloma.   PLAN:  1.  IgA Lambda MGUS: - We reviewed results from Dr. Toya Smothers office. - I have discussed with her diagnosis of MGUS in general. - I have recommended checking her SPEP, immunofixation, free light chains along with LDH and beta-2 microglobulin. - Have also recommended doing a skeletal survey. - RTC 3 to 4 weeks to discuss results.  2.  CKD: - Stage IIIb CKD since 2009, thought to be secondary to hypertension and age associated decline in GFR.  Proteinuria was less than 100 mg. - Continue follow-ups with Dr. Theador Hawthorne.   All questions were answered. The patient knows to call the  clinic with any problems, questions or concerns.   Derek Jack, MD 01/15/21 12:57 PM  [place] [phone]   I, Thana Ates, am acting as a scribe for Dr. Sanda Linger.  I, Derek Jack MD, have reviewed the above documentation for accuracy and completeness, and I agree with the above.

## 2021-01-16 ENCOUNTER — Encounter (HOSPITAL_COMMUNITY): Payer: Self-pay

## 2021-01-16 LAB — KAPPA/LAMBDA LIGHT CHAINS
Kappa free light chain: 28.2 mg/L — ABNORMAL HIGH (ref 3.3–19.4)
Kappa, lambda light chain ratio: 1.86 — ABNORMAL HIGH (ref 0.26–1.65)
Lambda free light chains: 15.2 mg/L (ref 5.7–26.3)

## 2021-01-16 LAB — BETA 2 MICROGLOBULIN, SERUM: Beta-2 Microglobulin: 3.1 mg/L — ABNORMAL HIGH (ref 0.6–2.4)

## 2021-01-16 NOTE — Progress Notes (Signed)
I met with the patient during and after visit with Dr. Katragadda. I introduced myself and explained my role in the patient's care. I provided my contact information and encouraged the patient to call with questions or concerns. 

## 2021-01-17 LAB — PROTEIN ELECTROPHORESIS, SERUM
A/G Ratio: 1.2 (ref 0.7–1.7)
Albumin ELP: 4.1 g/dL (ref 2.9–4.4)
Alpha-1-Globulin: 0.2 g/dL (ref 0.0–0.4)
Alpha-2-Globulin: 0.9 g/dL (ref 0.4–1.0)
Beta Globulin: 1.1 g/dL (ref 0.7–1.3)
Gamma Globulin: 1.2 g/dL (ref 0.4–1.8)
Globulin, Total: 3.3 g/dL (ref 2.2–3.9)
Total Protein ELP: 7.4 g/dL (ref 6.0–8.5)

## 2021-01-17 LAB — IMMUNOFIXATION ELECTROPHORESIS
IgA: 219 mg/dL (ref 64–422)
IgG (Immunoglobin G), Serum: 1198 mg/dL (ref 586–1602)
IgM (Immunoglobulin M), Srm: 62 mg/dL (ref 26–217)
Total Protein ELP: 7.4 g/dL (ref 6.0–8.5)

## 2021-01-21 ENCOUNTER — Other Ambulatory Visit: Payer: Self-pay

## 2021-01-21 ENCOUNTER — Ambulatory Visit (INDEPENDENT_AMBULATORY_CARE_PROVIDER_SITE_OTHER): Payer: Medicare Other | Admitting: Internal Medicine

## 2021-01-21 ENCOUNTER — Encounter: Payer: Self-pay | Admitting: Internal Medicine

## 2021-01-21 VITALS — BP 152/84 | HR 61 | Temp 98.7°F | Resp 16 | Ht 65.5 in | Wt 180.4 lb

## 2021-01-21 DIAGNOSIS — R5383 Other fatigue: Secondary | ICD-10-CM

## 2021-01-21 DIAGNOSIS — N1832 Chronic kidney disease, stage 3b: Secondary | ICD-10-CM

## 2021-01-21 DIAGNOSIS — D472 Monoclonal gammopathy: Secondary | ICD-10-CM | POA: Diagnosis not present

## 2021-01-21 DIAGNOSIS — I10 Essential (primary) hypertension: Secondary | ICD-10-CM

## 2021-01-21 NOTE — Patient Instructions (Signed)
Please clarify your medications with your Nephrologist. - Lisinopril, Losartan and Hydrochlorothiazide.  You should not be taking Lisinopril and Losartan together.  Please follow up with Oncologist as scheduled.

## 2021-01-26 NOTE — Assessment & Plan Note (Signed)
Undergoing workup, managed by Oncology She has been anxious about it. I had discussion about the condition to the best of my knowledge and provided support. Has follow up appointment with Heme/Onc.

## 2021-01-26 NOTE — Assessment & Plan Note (Signed)
Follows up with Dr Theador Hawthorne Needs clarification about her medications, advised to contact office. Avoid nephrotoxic agents

## 2021-01-26 NOTE — Assessment & Plan Note (Addendum)
BP Readings from Last 1 Encounters:  01/21/21 (!) 152/84   uncontrolled due to medication noncompliance, on ACEi and ARB - she will discuss with Nephrology. Anxiety also contributing to her condition. Counseled for compliance with the medications Advised DASH diet and moderate exercise/walking, at least 150 mins/week

## 2021-01-26 NOTE — Progress Notes (Signed)
Acute Office Visit  Subjective:    Patient ID: Nicole Good, female    DOB: [birth year], 80 y.o.   MRN: [id number]  Chief Complaint  Patient presents with  . Fatigue    Pt has been feeling weak and tired hasnt felt herself the last 2 weeks she is sleepy all the time     HPI Patient is in today for evaluation of fatigue and depressed mood for last few weeks. She has been feeling anxious about her recent diagnosis of MGUS as she read it to be cancer. She has not been able to understand the condition from the resources she was provided. She has an appointment with Heme/Onc.  Of note, she has been confused about her HTN medications as well. She is closely followed by Nephrology and was started on ACEi recently. Of note, she was already taking ARB as she had cough with Lisinopril in the past. She denies any chest pain, dyspnea or palpitations.  Past Medical History:  Diagnosis Date  . Arthritis    "maybe in my right knee and hip" (12/03/2015)  . DVT (deep venous thrombosis) ([place]) 12/03/2015   LLE  . Heart murmur    "when I was a little girl; gone now"  . High cholesterol   . Hypertension   . Osteoporosis   . Prolapsed internal hemorrhoids, grade 3 07/04/2014  . Pulmonary emboli ([place]) ~ 2012  . Right arm weakness 10/30/2015    Past Surgical History:  Procedure Laterality Date  . ABDOMINAL HYSTERECTOMY  ~ 1979   "partial"  . CATARACT EXTRACTION W/ INTRAOCULAR LENS  IMPLANT, BILATERAL Bilateral 2017  . DILATION AND CURETTAGE OF UTERUS    . TONSILLECTOMY  1950    Family History  Problem Relation Age of Onset  . Stroke Mother   . Heart disease Mother   . Hypertension Maternal Grandmother     Social History   Socioeconomic History  . Marital status: Married    Spouse name: Not on file  . Number of children: 1  . Years of education: MA  . Highest education level: Not on file  Occupational History  . Occupation: retired    Comment: middle school principle   Tobacco  Use  . Smoking status: Never Smoker  . Smokeless tobacco: Never Used  Vaping Use  . Vaping Use: Never used  Substance and Sexual Activity  . Alcohol use: No  . Drug use: No  . Sexual activity: Never  Other Topics Concern  . Not on file  Social History Narrative   Lives with husband-married 74 years       1 son- no grandkids    Right handed       Enjoys: reads, cleaned and organized, piano       Diet: eats all foods groups and tries to balanced diet well-occasionally hot dog and red meat   Caffeine: hot chocolate, green tea   Water: 3 liters      Exercise 55 minutes 5 days of week       Wears seat belt   Smoke detectors at home   Does not use phone while driving   Social Determinants of Health   Financial Resource Strain: Low Risk   . Difficulty of Paying Living Expenses: Not hard at all  Food Insecurity: No Food Insecurity  . Worried About Charity fundraiser in the Last Year: Never true  . Ran Out of Food in the Last Year: Never true  Transportation Needs:  No Transportation Needs  . Lack of Transportation (Medical): No  . Lack of Transportation (Non-Medical): No  Physical Activity: Sufficiently Active  . Days of Exercise per Week: 4 days  . Minutes of Exercise per Session: 40 min  Stress: No Stress Concern Present  . Feeling of Stress : Not at all  Social Connections: Socially Integrated  . Frequency of Communication with Friends and Family: More than three times a week  . Frequency of Social Gatherings with Friends and Family: More than three times a week  . Attends Religious Services: More than 4 times per year  . Active Member of Clubs or Organizations: No  . Attends Archivist Meetings: 1 to 4 times per year  . Marital Status: Married  Human resources officer Violence: Not At Risk  . Fear of Current or Ex-Partner: No  . Emotionally Abused: No  . Physically Abused: No  . Sexually Abused: No    Outpatient Medications Prior to Visit  Medication Sig  Dispense Refill  . apixaban (ELIQUIS) 2.5 MG TABS tablet Take 1 tablet (2.5 mg total) by mouth 2 (two) times daily. 60 tablet 3  . Calcium 500 MG tablet Take 1 tablet (500 mg total) by mouth 3 (three) times daily with meals. 90 tablet 3  . Calcium Carb-Cholecalciferol (OYSTER SHELL CALCIUM W/D) 500-200 MG-UNIT TABS Take 1 tablet by mouth 3 (three) times daily.    . Cholecalciferol (VITAMIN D3) 1.25 MG (50000 UT) CAPS Take 1 capsule by mouth daily.    Marland Kitchen lisinopril (ZESTRIL) 10 MG tablet Take 1 tablet by mouth daily.    Marland Kitchen losartan (COZAAR) 50 MG tablet Take 1 tablet (50 mg total) by mouth daily. 90 tablet 1  . Multiple Vitamins-Minerals (MULTIVITAMINS THER. W/MINERALS) TABS Take 1 tablet by mouth every morning.    Marland Kitchen omega-3 acid ethyl esters (LOVAZA) 1 g capsule Take 1 capsule by mouth 2 (two) times daily.    Loma Boston Calcium 500 MG TABS TAKE 1 TABLET (500 MG TOTAL) BY MOUTH 3 (THREE) TIMES DAILY WITH MEALS. 270 tablet 1  . pantoprazole (PROTONIX) 40 MG tablet Take 40 mg by mouth daily.    . rosuvastatin (CRESTOR) 10 MG tablet Take 1 tablet by mouth daily.    . hydrochlorothiazide (HYDRODIURIL) 25 MG tablet TAKE 1 TABLET (25 MG TOTAL) BY MOUTH DAILY. (Patient not taking: Reported on 01/21/2021) 90 tablet 0  . hydrochlorothiazide (HYDRODIURIL) 25 MG tablet Take 1 tablet by mouth daily. (Patient not taking: Reported on 01/21/2021)     No facility-administered medications prior to visit.    No Known Allergies  Review of Systems  Constitutional: Positive for fatigue. Negative for activity change, appetite change, chills and fever.  HENT: Negative for congestion, sinus pressure, sinus pain and sore throat.   Eyes: Negative for pain and discharge.  Respiratory: Negative for cough and shortness of breath.   Cardiovascular: Negative for chest pain and palpitations.  Gastrointestinal: Negative for abdominal pain, constipation, diarrhea, nausea and vomiting.  Endocrine: Negative for polydipsia and  polyuria.  Genitourinary: Negative for dysuria and hematuria.  Musculoskeletal: Negative for neck pain and neck stiffness.  Skin: Negative for rash.  Neurological: Negative for dizziness and weakness.  Psychiatric/Behavioral: Positive for dysphoric mood. Negative for agitation and behavioral problems. The patient is nervous/anxious.        Objective:    Physical Exam Vitals reviewed.  Constitutional:      General: She is not in acute distress.    Appearance: She is  not diaphoretic.  HENT:     Head: Normocephalic and atraumatic.     Nose: Nose normal. No congestion.     Mouth/Throat:     Mouth: Mucous membranes are moist.     Pharynx: No posterior oropharyngeal erythema.  Eyes:     General: No scleral icterus.    Extraocular Movements: Extraocular movements intact.     Pupils: Pupils are equal, round, and reactive to light.  Cardiovascular:     Rate and Rhythm: Normal rate and regular rhythm.     Pulses: Normal pulses.     Heart sounds: Normal heart sounds. No murmur heard.   Pulmonary:     Breath sounds: Normal breath sounds. No wheezing or rales.  Musculoskeletal:     Cervical back: Neck supple. No tenderness.     Right lower leg: No edema.     Left lower leg: No edema.  Skin:    General: Skin is warm.     Findings: No rash.  Neurological:     General: No focal deficit present.     Mental Status: She is alert and oriented to person, place, and time.  Psychiatric:        Mood and Affect: Mood normal.        Behavior: Behavior normal.     BP (!) 152/84 (BP Location: Right Arm, Patient Position: Sitting, Cuff Size: Normal)   Pulse 61   Temp 98.7 F (37.1 C) (Oral)   Resp 16   Ht 5' 5.5" (1.664 m)   Wt 180 lb 6.4 oz (81.8 kg)   SpO2 97%   BMI 29.56 kg/m  Wt Readings from Last 3 Encounters:  01/21/21 180 lb 6.4 oz (81.8 kg)  01/15/21 181 lb 9.6 oz (82.4 kg)  10/25/20 184 lb 6.4 oz (83.6 kg)    Health Maintenance Due  Topic Date Due  . COVID-19 Vaccine  (1) Never done    There are no preventive care reminders to display for this patient.   Lab Results  Component Value Date   TSH 0.46 02/14/2020   Lab Results  Component Value Date   WBC 5.7 01/15/2021   HGB 13.0 01/15/2021   HCT 41.5 01/15/2021   MCV 94.7 01/15/2021   PLT 214 01/15/2021   Lab Results  Component Value Date   NA 137 01/15/2021   K 4.1 01/15/2021   CO2 26 01/15/2021   GLUCOSE 87 01/15/2021   BUN 25 (H) 01/15/2021   CREATININE 1.39 (H) 01/15/2021   BILITOT 0.4 01/15/2021   ALKPHOS 55 01/15/2021   AST 32 01/15/2021   ALT 33 01/15/2021   PROT 8.0 01/15/2021   ALBUMIN 4.5 01/15/2021   CALCIUM 9.9 01/15/2021   ANIONGAP 6 01/15/2021   Lab Results  Component Value Date   CHOL 174 11/29/2019   Lab Results  Component Value Date   HDL 36 (L) 11/29/2019   Lab Results  Component Value Date   LDLCALC 111 (H) 11/29/2019   Lab Results  Component Value Date   TRIG 159 (H) 11/29/2019   Lab Results  Component Value Date   CHOLHDL 4.8 11/29/2019   Lab Results  Component Value Date   HGBA1C 6.1 (H) 11/29/2019       Assessment & Plan:   Problem List Items Addressed This Visit      Cardiovascular and Mediastinum   Essential hypertension    BP Readings from Last 1 Encounters:  01/21/21 (!) 152/84   uncontrolled due to medication noncompliance, on  ACEi and ARB - she will discuss with Nephrology. Anxiety also contributing to her condition. Counseled for compliance with the medications Advised DASH diet and moderate exercise/walking, at least 150 mins/week         Genitourinary   Stage 3b chronic kidney disease ([place])    Follows up with Dr Theador Hawthorne Needs clarification about her medications, advised to contact office. Avoid nephrotoxic agents        Other   Fatigue - Primary    Chronic complaint Could be medication induced electrolyte imbalance and/or depression/anxiety about her medical condition (MGUS) Needs verification her current HTN  medication from Nephrology (On ACEi and ARB simultaneously)       MGUS (monoclonal gammopathy of unknown significance)    Undergoing workup, managed by Oncology She has been anxious about it. I had discussion about the condition to the best of my knowledge and provided support. Has follow up appointment with Heme/Onc.          No orders of the defined types were placed in this encounter.    Lindell Spar, MD

## 2021-01-26 NOTE — Assessment & Plan Note (Signed)
Chronic complaint Could be medication induced electrolyte imbalance and/or depression/anxiety about her medical condition (MGUS) Needs verification her current HTN medication from Nephrology (On ACEi and ARB simultaneously)

## 2021-02-12 ENCOUNTER — Encounter (HOSPITAL_COMMUNITY): Payer: Self-pay | Admitting: Hematology

## 2021-02-12 ENCOUNTER — Other Ambulatory Visit: Payer: Self-pay

## 2021-02-12 ENCOUNTER — Inpatient Hospital Stay (HOSPITAL_COMMUNITY): Payer: Medicare Other | Attending: Hematology and Oncology | Admitting: Hematology and Oncology

## 2021-02-12 VITALS — BP 148/105 | HR 68 | Temp 96.9°F | Resp 19 | Wt 182.2 lb

## 2021-02-12 DIAGNOSIS — I129 Hypertensive chronic kidney disease with stage 1 through stage 4 chronic kidney disease, or unspecified chronic kidney disease: Secondary | ICD-10-CM | POA: Diagnosis not present

## 2021-02-12 DIAGNOSIS — D472 Monoclonal gammopathy: Secondary | ICD-10-CM | POA: Insufficient documentation

## 2021-02-12 DIAGNOSIS — N1832 Chronic kidney disease, stage 3b: Secondary | ICD-10-CM | POA: Diagnosis not present

## 2021-02-12 DIAGNOSIS — Z8249 Family history of ischemic heart disease and other diseases of the circulatory system: Secondary | ICD-10-CM | POA: Insufficient documentation

## 2021-02-12 DIAGNOSIS — R809 Proteinuria, unspecified: Secondary | ICD-10-CM | POA: Diagnosis not present

## 2021-02-12 DIAGNOSIS — Z86718 Personal history of other venous thrombosis and embolism: Secondary | ICD-10-CM | POA: Insufficient documentation

## 2021-02-12 DIAGNOSIS — Z79899 Other long term (current) drug therapy: Secondary | ICD-10-CM | POA: Insufficient documentation

## 2021-02-12 DIAGNOSIS — Z8719 Personal history of other diseases of the digestive system: Secondary | ICD-10-CM | POA: Insufficient documentation

## 2021-02-12 DIAGNOSIS — Z86711 Personal history of pulmonary embolism: Secondary | ICD-10-CM | POA: Diagnosis not present

## 2021-02-12 DIAGNOSIS — Z7901 Long term (current) use of anticoagulants: Secondary | ICD-10-CM | POA: Insufficient documentation

## 2021-02-12 DIAGNOSIS — Z823 Family history of stroke: Secondary | ICD-10-CM | POA: Insufficient documentation

## 2021-02-12 NOTE — Progress Notes (Signed)
[facility] [address] [place] [address]   CLINIC:  Medical Oncology/Hematology  Patient Care Team: Perlie Mayo, NP as PCP - General (Family Medicine) Brien Mates, RN as Oncology Nurse Navigator (Oncology)   Subjective:  Nicole Good 80 y.o. female is for follow up on concern of of a monoclonal gammopathy.  On exam today Nicole Good is very anxious.  She notes that she had a relative who passed away from multiple myeloma and is very scared by this abnormal protein.  She notes that she has been losing sleep.  Fortunately she currently denies any fevers, chills, sweats, nausea, vomiting or diarrhea.  Her kidney function continues to improve with the assistance of her kidney doctor.  She has not completed her metastatic survey.  She has been providing 24-hour urines to nephrology, however we would like 1 here for purposes to look for monoclonal protein.  She currently denies any fevers, chills, sweats, nausea, vomiting or diarrhea.  A full 10 point ROS is listed below.  MEDICAL HISTORY:  Past Medical History:  Diagnosis Date  . Arthritis    "maybe in my right knee and hip" (12/03/2015)  . DVT (deep venous thrombosis) ([place]) 12/03/2015   LLE  . Heart murmur    "when I was a little girl; gone now"  . High cholesterol   . Hypertension   . Osteoporosis   . Prolapsed internal hemorrhoids, grade 3 07/04/2014  . Pulmonary emboli ([place]) ~ 2012  . Right arm weakness 10/30/2015    SURGICAL HISTORY: Past Surgical History:  Procedure Laterality Date  . ABDOMINAL HYSTERECTOMY  ~ 1979   "partial"  . CATARACT EXTRACTION W/ INTRAOCULAR LENS  IMPLANT, BILATERAL Bilateral 2017  . DILATION AND CURETTAGE OF UTERUS    . TONSILLECTOMY  1950    SOCIAL HISTORY: Social History   Socioeconomic History  . Marital status: Married    Spouse name: Not on file  . Number of children: 1  . Years of education: MA  . Highest education level: Not on file  Occupational  History  . Occupation: retired    Comment: middle school principle   Tobacco Use  . Smoking status: Never Smoker  . Smokeless tobacco: Never Used  Vaping Use  . Vaping Use: Never used  Substance and Sexual Activity  . Alcohol use: No  . Drug use: No  . Sexual activity: Never  Other Topics Concern  . Not on file  Social History Narrative   Lives with husband-married 60 years       1 son- no grandkids    Right handed       Enjoys: reads, cleaned and organized, piano       Diet: eats all foods groups and tries to balanced diet well-occasionally hot dog and red meat   Caffeine: hot chocolate, green tea   Water: 3 liters      Exercise 55 minutes 5 days of week       Wears seat belt   Smoke detectors at home   Does not use phone while driving   Social Determinants of Health   Financial Resource Strain: Low Risk   . Difficulty of Paying Living Expenses: Not hard at all  Food Insecurity: No Food Insecurity  . Worried About Charity fundraiser in the Last Year: Never true  . Ran Out of Food in the Last Year: Never true  Transportation Needs: No Transportation Needs  . Lack of Transportation (  Medical): No  . Lack of Transportation (Non-Medical): No  Physical Activity: Sufficiently Active  . Days of Exercise per Week: 4 days  . Minutes of Exercise per Session: 40 min  Stress: No Stress Concern Present  . Feeling of Stress : Not at all  Social Connections: Socially Integrated  . Frequency of Communication with Friends and Family: More than three times a week  . Frequency of Social Gatherings with Friends and Family: More than three times a week  . Attends Religious Services: More than 4 times per year  . Active Member of Clubs or Organizations: No  . Attends Archivist Meetings: 1 to 4 times per year  . Marital Status: Married  Human resources officer Violence: Not At Risk  . Fear of Current or Ex-Partner: No  . Emotionally Abused: No  . Physically Abused: No  .  Sexually Abused: No    FAMILY HISTORY: Family History  Problem Relation Age of Onset  . Stroke Mother   . Heart disease Mother   . Hypertension Maternal Grandmother     ALLERGIES:  has No Known Allergies.  MEDICATIONS:  Current Outpatient Medications  Medication Sig Dispense Refill  . apixaban (ELIQUIS) 2.5 MG TABS tablet Take 1 tablet (2.5 mg total) by mouth 2 (two) times daily. 60 tablet 3  . Calcium 500 MG tablet Take 1 tablet (500 mg total) by mouth 3 (three) times daily with meals. 90 tablet 3  . Calcium Carb-Cholecalciferol (OYSTER SHELL CALCIUM W/D) 500-200 MG-UNIT TABS Take 1 tablet by mouth 3 (three) times daily.    . Cholecalciferol (VITAMIN D3) 1.25 MG (50000 UT) CAPS Take 1 capsule by mouth daily.    Marland Kitchen lisinopril (ZESTRIL) 10 MG tablet Take 1 tablet by mouth daily.    Marland Kitchen losartan (COZAAR) 50 MG tablet Take 1 tablet (50 mg total) by mouth daily. 90 tablet 1  . Multiple Vitamins-Minerals (MULTIVITAMINS THER. W/MINERALS) TABS Take 1 tablet by mouth every morning.    Marland Kitchen omega-3 acid ethyl esters (LOVAZA) 1 g capsule Take 1 capsule by mouth 2 (two) times daily.    Loma Boston Calcium 500 MG TABS TAKE 1 TABLET (500 MG TOTAL) BY MOUTH 3 (THREE) TIMES DAILY WITH MEALS. 270 tablet 1  . pantoprazole (PROTONIX) 40 MG tablet Take 40 mg by mouth daily.    . rosuvastatin (CRESTOR) 10 MG tablet Take 1 tablet by mouth daily.     No current facility-administered medications for this visit.    REVIEW OF SYSTEMS:   Review of Systems  Constitutional: Positive for fatigue (50%). Negative for appetite change.  All other systems reviewed and are negative.  PHYSICAL EXAMINATION: ECOG PERFORMANCE STATUS: 1 - Symptomatic but completely ambulatory  Vitals:   02/12/21 1502  BP: (!) 148/105  Pulse: 68  Resp: 19  Temp: (!) 96.9 F (36.1 C)  SpO2: 99%   Filed Weights   02/12/21 1502  Weight: 182 lb 3.2 oz (82.6 kg)   Physical Exam Vitals reviewed.  Constitutional:       Appearance: Normal appearance.  Eyes:     Comments: Drooping right eyelid  Cardiovascular:     Rate and Rhythm: Normal rate and regular rhythm.     Pulses: Normal pulses.     Heart sounds: Normal heart sounds.  Pulmonary:     Effort: Pulmonary effort is normal.     Breath sounds: Normal breath sounds.  Chest:  Breasts:     Right: No axillary adenopathy.  Left: No axillary adenopathy.    Abdominal:     Palpations: Abdomen is soft. There is no hepatomegaly, splenomegaly or mass.     Tenderness: There is no abdominal tenderness.     Hernia: No hernia is present.  Musculoskeletal:     Right lower leg: No edema.     Left lower leg: No edema.  Lymphadenopathy:     Cervical: No cervical adenopathy.     Upper Body:     Right upper body: No axillary or pectoral adenopathy.     Left upper body: No axillary or pectoral adenopathy.  Neurological:     General: No focal deficit present.     Mental Status: She is alert and oriented to person, place, and time.  Psychiatric:        Mood and Affect: Mood normal.        Behavior: Behavior normal.    LABORATORY DATA:  I have reviewed the data as listed Recent Results (from the past 2160 hour(s))  Protein electrophoresis, serum     Status: None   Collection Time: 01/15/21  2:26 PM  Result Value Ref Range   Total Protein ELP 7.4 6.0 - 8.5 g/dL   Albumin ELP 4.1 2.9 - 4.4 g/dL   Alpha-1-Globulin 0.2 0.0 - 0.4 g/dL   Alpha-2-Globulin 0.9 0.4 - 1.0 g/dL   Beta Globulin 1.1 0.7 - 1.3 g/dL   Gamma Globulin 1.2 0.4 - 1.8 g/dL   M-Spike, % Not Observed Not Observed g/dL   SPE Interp. Comment     Comment: (NOTE) The SPE pattern appears unremarkable. Evidence of monoclonal protein is not apparent. Performed At: [facility] [place], [place] [id number] Rush Farmer MD VH:[id number]    Comment Comment     Comment: (NOTE) Protein electrophoresis scan will follow via computer, mail, or courier delivery.     Globulin, Total 3.3 2.2 - 3.9 g/dL   A/G Ratio 1.2 0.7 - 1.7  Immunofixation electrophoresis     Status: None   Collection Time: 01/15/21  2:26 PM  Result Value Ref Range   Total Protein ELP 7.4 6.0 - 8.5 g/dL   IgG (Immunoglobin G), Serum 1,198 586 - 1,602 mg/dL   IgA 219 64 - 422 mg/dL   IgM (Immunoglobulin M), Srm 62 26 - 217 mg/dL    Comment: (NOTE) Performed At: [facility] [address] [place], [place] [id number] Rush Farmer MD UV:[id number]    Immunofixation Result, Serum Comment     Comment: (NOTE) The immunofixation pattern appears unremarkable. Evidence of monoclonal protein is not apparent.   Beta 2 microglobuline, serum     Status: Abnormal   Collection Time: 01/15/21  2:26 PM  Result Value Ref Range   Beta-2 Microglobulin 3.1 (H) 0.6 - 2.4 mg/L    Comment: (NOTE) Siemens Immulite 2000 Immunochemiluminometric assay (ICMA) Values obtained with different assay methods or kits cannot be used interchangeably. Results cannot be interpreted as absolute evidence of the presence or absence of malignant disease. Performed At: [facility] [place], [place] [id number] Rush Farmer MD VF:[id number]   Kappa/lambda light chains     Status: Abnormal   Collection Time: 01/15/21  2:26 PM  Result Value Ref Range   Kappa free light chain 28.2 (H) 3.3 - 19.4 mg/L   Lamda free light chains 15.2 5.7 - 26.3 mg/L   Kappa, lamda light chain ratio 1.86 (H) 0.26 - 1.65    Comment: (NOTE) Performed At: [facility]  [place], [place] [id number] Rush Farmer MD IF:[id number]   Lactate dehydrogenase     Status: None   Collection Time: 01/15/21  2:26 PM  Result Value Ref Range   LDH 159 98 - 192 U/L    Comment: Performed at [facility], [address] [place], [place] [address]  Comprehensive metabolic panel     Status: Abnormal   Collection Time: 01/15/21  2:26 PM  Result Value Ref Range   Sodium 137 135 - 145 mmol/L    Potassium 4.1 3.5 - 5.1 mmol/L   Chloride 105 98 - 111 mmol/L   CO2 26 22 - 32 mmol/L   Glucose, Bld 87 70 - 99 mg/dL    Comment: Glucose reference range applies only to samples taken after fasting for at least 8 hours.   BUN 25 (H) 8 - 23 mg/dL   Creatinine, Ser 1.39 (H) 0.44 - 1.00 mg/dL   Calcium 9.9 8.9 - 10.3 mg/dL   Total Protein 8.0 6.5 - 8.1 g/dL   Albumin 4.5 3.5 - 5.0 g/dL   AST 32 15 - 41 U/L   ALT 33 0 - 44 U/L   Alkaline Phosphatase 55 38 - 126 U/L   Total Bilirubin 0.4 0.3 - 1.2 mg/dL   GFR, Estimated 38 (L) >60 mL/min    Comment: (NOTE) Calculated using the CKD-EPI Creatinine Equation (2021)    Anion gap 6 5 - 15    Comment: Performed at [facility], [address], [place], [place] [address]  CBC with Differential     Status: None   Collection Time: 01/15/21  2:26 PM  Result Value Ref Range   WBC 5.7 4.0 - 10.5 K/uL   RBC 4.38 3.87 - 5.11 MIL/uL   Hemoglobin 13.0 12.0 - 15.0 g/dL   HCT 41.5 36.0 - 46.0 %   MCV 94.7 80.0 - 100.0 fL   MCH 29.7 26.0 - 34.0 pg   MCHC 31.3 30.0 - 36.0 g/dL   RDW 13.3 11.5 - 15.5 %   Platelets 214 150 - 400 K/uL   nRBC 0.0 0.0 - 0.2 %   Neutrophils Relative % 61 %   Neutro Abs 3.5 1.7 - 7.7 K/uL   Lymphocytes Relative 26 %   Lymphs Abs 1.5 0.7 - 4.0 K/uL   Monocytes Relative 8 %   Monocytes Absolute 0.4 0.1 - 1.0 K/uL   Eosinophils Relative 4 %   Eosinophils Absolute 0.3 0.0 - 0.5 K/uL   Basophils Relative 1 %   Basophils Absolute 0.0 0.0 - 0.1 K/uL   Immature Granulocytes 0 %   Abs Immature Granulocytes 0.01 0.00 - 0.07 K/uL    Comment: Performed at [facility], [address], [place], [place] [address]    RADIOGRAPHIC STUDIES: I have personally reviewed the radiological images as listed and agreed with the findings in the report. No results found.  ASSESSMENT:  1.  IgA Lambda MGUS: - Patient seen at the request of Dr. Theador Hawthorne. - Labs done at his office on 10/22/2020 for work-up of CKD showed faint IgA lambda  monoclonal immunoglobulin in urine. - She denies any fevers, night sweats or weight loss.  No new bone lesions.  No neuropathy symptoms.  No recurrent infections noted.  2.  Social/family history: - She lives at home with her husband. 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  Non-smoker.  No exposure to chemicals. -Maternal cousin had myeloma.   PLAN:  #IgA Lambda MGUS: -  repeat studies on 01/15/2021 show no evidence of an MGUS or monoclonal protein.  --initial study was a poorly defined area of protein noted with IgA and lambda. - Have also recommended doing a skeletal survey, not yet performed. Encouraged her to get this done today.  - recommend a 24 hour UPEP to assess further for this IgA Lambda monoclonal protein. If not present in the urine I do not believe she needs routine f/u in our clinic.  --RTC in 3 months or sooner if indicated by the above labs.   2.  CKD: - Stage IIIb CKD since 2009, thought to be secondary to hypertension and age associated decline in GFR.  Proteinuria was less than 100 mg. - Continue follow-ups with Dr. Theador Hawthorne.   All questions were answered. The patient knows to call the clinic with any problems, questions or concerns.  Ledell Peoples, MD Department of Hematology/Oncology [place] [facility] Phone: [phone] Pager: [phone] Email: [email or web address]

## 2021-02-20 ENCOUNTER — Ambulatory Visit: Admitting: Internal Medicine

## 2021-03-18 ENCOUNTER — Inpatient Hospital Stay (HOSPITAL_COMMUNITY): Admission: RE | Admit: 2021-03-18 | Payer: Medicare Other | Source: Ambulatory Visit

## 2021-04-01 IMAGING — DX DG TOE GREAT 2+V*R*
3 series · 3 of 3 positions shown · non-contrast
Comparison: None.

CLINICAL DATA: Pt states she woke up [REDACTED] morning and her right
great toe was swollen and painful/no known injury

EXAM:
RIGHT GREAT TOE

[toe ap]
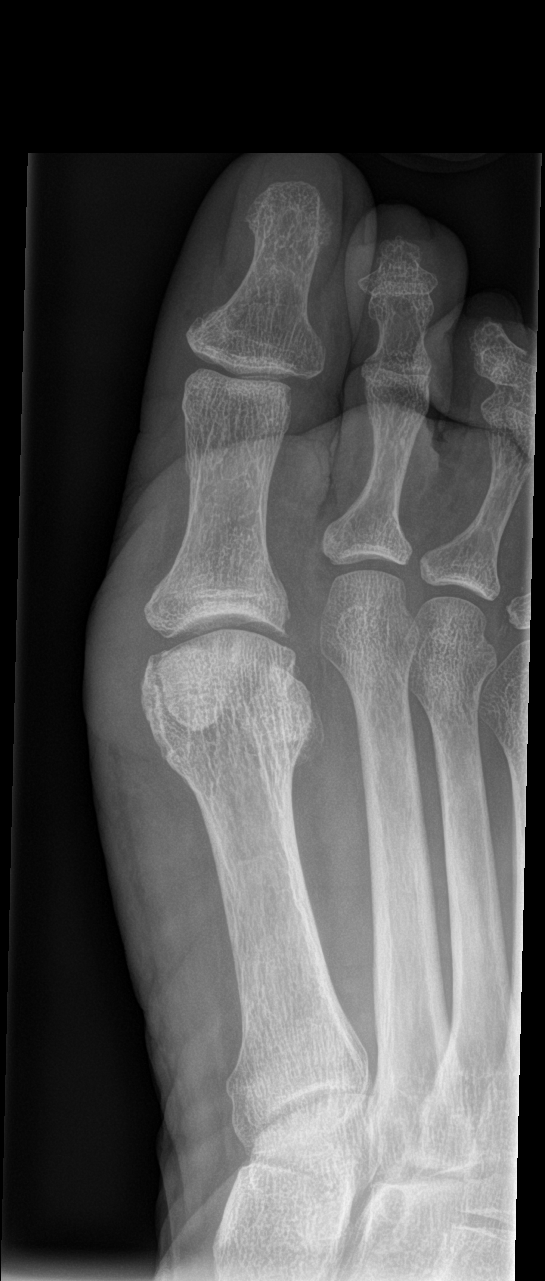

[toe obl]
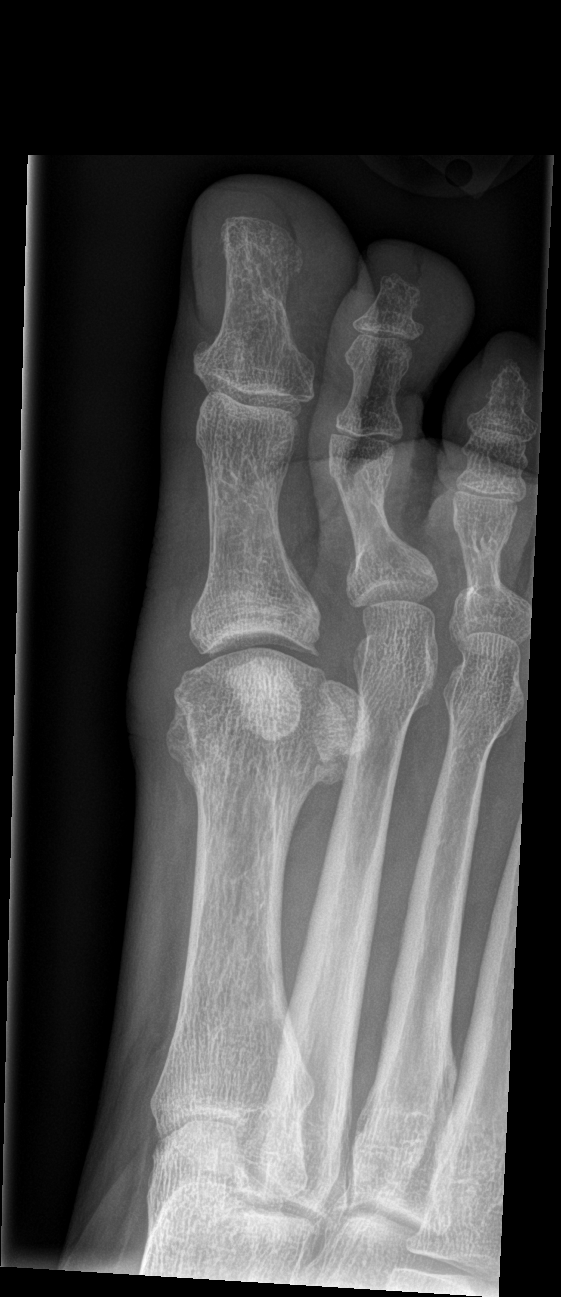

[toe lat]
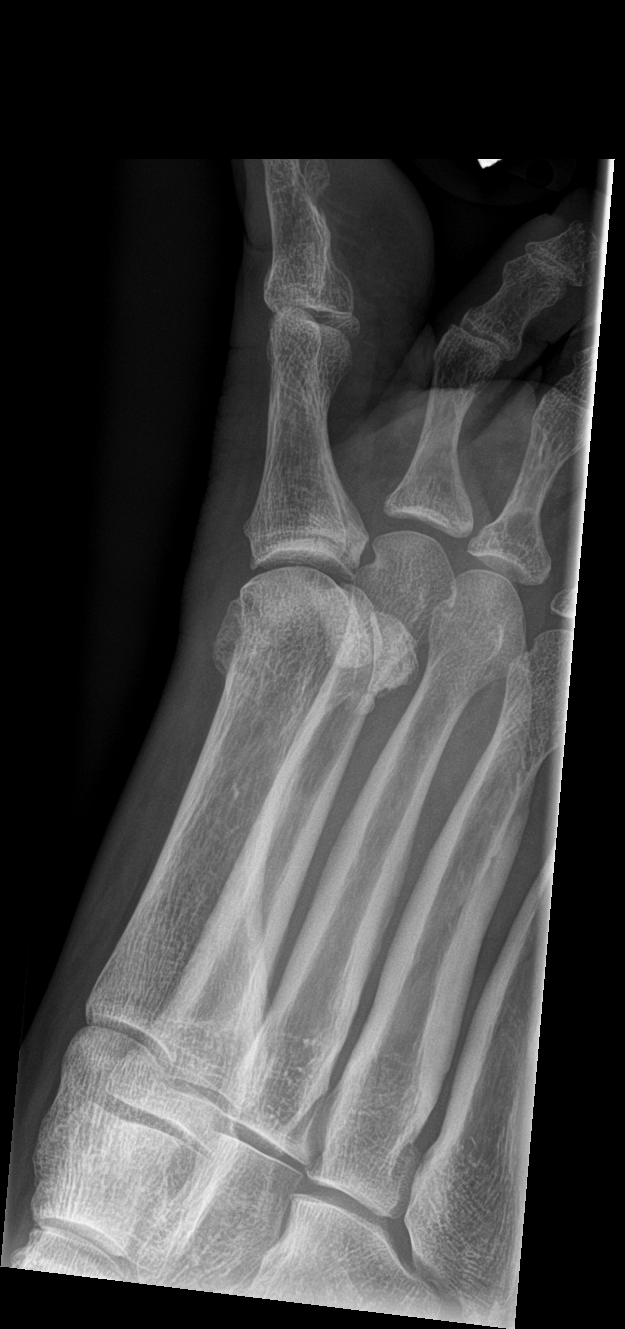

[3 of 3 positions shown; findings below may reference images not displayed]

FINDINGS: No fracture or bone lesion.

First metatarsophalangeal joint is normally spaced and aligned.
There minimal marginal osteophytes. A small marginal erosion is
suggested along the lateral base of the great toe proximal phalanx.

The IP joint of the great toe is normally spaced and aligned no
arthropathic changes.

Soft tissue swelling, mild, is noted at the first
metacarpophalangeal joint.
IMPRESSION: 1. No fracture.
2. Minor arthropathic changes noted at the first metatarsophalangeal
joint with a possible small marginal erosion.

## 2021-04-11 ENCOUNTER — Other Ambulatory Visit (HOSPITAL_COMMUNITY): Payer: Self-pay

## 2021-04-11 ENCOUNTER — Ambulatory Visit (HOSPITAL_COMMUNITY): Payer: Medicare Other

## 2021-04-11 DIAGNOSIS — D472 Monoclonal gammopathy: Secondary | ICD-10-CM

## 2021-04-15 LAB — UPEP/UIFE/LIGHT CHAINS/TP, 24-HR UR
% BETA, Urine: 0 %
ALPHA 1 URINE: 0 %
Albumin, U: 100 %
Alpha 2, Urine: 0 %
Free Kappa Lt Chains,Ur: 14.57 mg/L (ref 1.17–86.46)
Free Kappa/Lambda Ratio: 8.05 (ref 1.83–14.26)
Free Lambda Lt Chains,Ur: 1.81 mg/L (ref 0.27–15.21)
GAMMA GLOBULIN URINE: 0 %
Total Protein, Urine-Ur/day: 88 mg/24 hr (ref 30–150)
Total Protein, Urine: 13.1 mg/dL
Total Volume: 675

## 2021-04-17 ENCOUNTER — Other Ambulatory Visit: Payer: Self-pay

## 2021-04-17 ENCOUNTER — Ambulatory Visit (HOSPITAL_COMMUNITY)
Admission: RE | Admit: 2021-04-17 | Discharge: 2021-04-17 | Disposition: A | Discharge status: Home / Self Care | Payer: Medicare Other | Source: Ambulatory Visit | Attending: Hematology | Admitting: Hematology

## 2021-04-17 DIAGNOSIS — D472 Monoclonal gammopathy: Secondary | ICD-10-CM | POA: Diagnosis present

## 2021-04-18 ENCOUNTER — Other Ambulatory Visit: Payer: Self-pay | Admitting: Internal Medicine

## 2021-04-18 DIAGNOSIS — I10 Essential (primary) hypertension: Secondary | ICD-10-CM

## 2021-05-14 ENCOUNTER — Other Ambulatory Visit (HOSPITAL_COMMUNITY): Payer: Self-pay | Admitting: Surgery

## 2021-05-14 DIAGNOSIS — D472 Monoclonal gammopathy: Secondary | ICD-10-CM

## 2021-05-15 ENCOUNTER — Inpatient Hospital Stay (HOSPITAL_COMMUNITY): Payer: Medicare Other | Attending: Hematology

## 2021-05-15 ENCOUNTER — Other Ambulatory Visit: Payer: Self-pay

## 2021-05-15 DIAGNOSIS — Z86711 Personal history of pulmonary embolism: Secondary | ICD-10-CM | POA: Insufficient documentation

## 2021-05-15 DIAGNOSIS — R809 Proteinuria, unspecified: Secondary | ICD-10-CM | POA: Insufficient documentation

## 2021-05-15 DIAGNOSIS — Z86718 Personal history of other venous thrombosis and embolism: Secondary | ICD-10-CM | POA: Insufficient documentation

## 2021-05-15 DIAGNOSIS — Z823 Family history of stroke: Secondary | ICD-10-CM | POA: Diagnosis not present

## 2021-05-15 DIAGNOSIS — Z8719 Personal history of other diseases of the digestive system: Secondary | ICD-10-CM | POA: Diagnosis not present

## 2021-05-15 DIAGNOSIS — Z79899 Other long term (current) drug therapy: Secondary | ICD-10-CM | POA: Insufficient documentation

## 2021-05-15 DIAGNOSIS — N1832 Chronic kidney disease, stage 3b: Secondary | ICD-10-CM | POA: Diagnosis not present

## 2021-05-15 DIAGNOSIS — D472 Monoclonal gammopathy: Secondary | ICD-10-CM | POA: Insufficient documentation

## 2021-05-15 DIAGNOSIS — Z8249 Family history of ischemic heart disease and other diseases of the circulatory system: Secondary | ICD-10-CM | POA: Diagnosis not present

## 2021-05-15 DIAGNOSIS — I129 Hypertensive chronic kidney disease with stage 1 through stage 4 chronic kidney disease, or unspecified chronic kidney disease: Secondary | ICD-10-CM | POA: Diagnosis not present

## 2021-05-15 DIAGNOSIS — Z7901 Long term (current) use of anticoagulants: Secondary | ICD-10-CM | POA: Insufficient documentation

## 2021-05-15 LAB — CBC WITH DIFFERENTIAL/PLATELET
Abs Immature Granulocytes: 0.02 10*3/uL (ref 0.00–0.07)
Basophils Absolute: 0 10*3/uL (ref 0.0–0.1)
Basophils Relative: 1 %
Eosinophils Absolute: 0.2 10*3/uL (ref 0.0–0.5)
Eosinophils Relative: 4 %
HCT: 37.6 % (ref 36.0–46.0)
Hemoglobin: 12 g/dL (ref 12.0–15.0)
Immature Granulocytes: 0 %
Lymphocytes Relative: 21 %
Lymphs Abs: 1.1 10*3/uL (ref 0.7–4.0)
MCH: 30.1 pg (ref 26.0–34.0)
MCHC: 31.9 g/dL (ref 30.0–36.0)
MCV: 94.2 fL (ref 80.0–100.0)
Monocytes Absolute: 0.4 10*3/uL (ref 0.1–1.0)
Monocytes Relative: 8 %
Neutro Abs: 3.6 10*3/uL (ref 1.7–7.7)
Neutrophils Relative %: 66 %
Platelets: 220 10*3/uL (ref 150–400)
RBC: 3.99 MIL/uL (ref 3.87–5.11)
RDW: 14.3 % (ref 11.5–15.5)
WBC: 5.4 10*3/uL (ref 4.0–10.5)
nRBC: 0 % (ref 0.0–0.2)

## 2021-05-15 LAB — COMPREHENSIVE METABOLIC PANEL
ALT: 30 U/L (ref 0–44)
AST: 30 U/L (ref 15–41)
Albumin: 4.2 g/dL (ref 3.5–5.0)
Alkaline Phosphatase: 59 U/L (ref 38–126)
Anion gap: 6 (ref 5–15)
BUN: 27 mg/dL — ABNORMAL HIGH (ref 8–23)
CO2: 24 mmol/L (ref 22–32)
Calcium: 9 mg/dL (ref 8.9–10.3)
Chloride: 108 mmol/L (ref 98–111)
Creatinine, Ser: 1.29 mg/dL — ABNORMAL HIGH (ref 0.44–1.00)
GFR, Estimated: 42 mL/min — ABNORMAL LOW (ref >60)
Glucose, Bld: 91 mg/dL (ref 70–99)
Potassium: 4.6 mmol/L (ref 3.5–5.1)
Sodium: 138 mmol/L (ref 135–145)
Total Bilirubin: 0.3 mg/dL (ref 0.3–1.2)
Total Protein: 7.4 g/dL (ref 6.5–8.1)

## 2021-05-15 LAB — LACTATE DEHYDROGENASE: LDH: 158 U/L (ref 98–192)

## 2021-05-16 ENCOUNTER — Telehealth: Payer: Self-pay | Admitting: Family Medicine

## 2021-05-16 LAB — KAPPA/LAMBDA LIGHT CHAINS
Kappa free light chain: 37.1 mg/L — ABNORMAL HIGH (ref 3.3–19.4)
Kappa, lambda light chain ratio: 1.87 — ABNORMAL HIGH (ref 0.26–1.65)
Lambda free light chains: 19.8 mg/L (ref 5.7–26.3)

## 2021-05-16 NOTE — Telephone Encounter (Signed)
  Left message for patient to call back and schedule Medicare Annual Wellness Visit (AWV) in office.   If unable to come into the office for AWV,  please offer to do virtually or by telephone.  No hx of AWV eligible for AWVI as of 05/10/2019  Please schedule at anytime with [facility].      40 Minutes appointment   Any questions, please call me at [phone]

## 2021-05-17 LAB — PROTEIN ELECTROPHORESIS, SERUM
A/G Ratio: 1.2 (ref 0.7–1.7)
Albumin ELP: 3.7 g/dL (ref 2.9–4.4)
Alpha-1-Globulin: 0.2 g/dL (ref 0.0–0.4)
Alpha-2-Globulin: 0.8 g/dL (ref 0.4–1.0)
Beta Globulin: 1 g/dL (ref 0.7–1.3)
Gamma Globulin: 1.1 g/dL (ref 0.4–1.8)
Globulin, Total: 3.1 g/dL (ref 2.2–3.9)
Total Protein ELP: 6.8 g/dL (ref 6.0–8.5)

## 2021-05-17 LAB — IMMUNOFIXATION ELECTROPHORESIS
IgA: 197 mg/dL (ref 64–422)
IgG (Immunoglobin G), Serum: 1099 mg/dL (ref 586–1602)
IgM (Immunoglobulin M), Srm: 64 mg/dL (ref 26–217)
Total Protein ELP: 6.9 g/dL (ref 6.0–8.5)

## 2021-05-21 NOTE — Progress Notes (Signed)
[name], [place] [address]   CLINIC:  Medical Oncology/Hematology  PCP:  Nicole Mayo, NP [place] / [place] [address]  [phone]  REASON FOR VISIT:  Follow-up for MGUS  PRIOR THERAPY: none  CURRENT THERAPY: surveillance  INTERVAL HISTORY:  Nicole Good, a 80 y.o. female, returns for routine follow-up for her MGUS. Nicole Good was last seen on 01/15/2021.  Today she reports feeling good. She denies recent fevers and infections. She drinks 48 ounces of water daily.   REVIEW OF SYSTEMS:  Review of Systems  Constitutional:  Negative for appetite change and fatigue.  All other systems reviewed and are negative.  PAST MEDICAL/SURGICAL HISTORY:  Past Medical History:  Diagnosis Date   Arthritis    "maybe in my right knee and hip" (12/03/2015)   DVT (deep venous thrombosis) ([place]) 12/03/2015   LLE   Heart murmur    "when I was a little girl; gone now"   High cholesterol    Hypertension    Osteoporosis    Prolapsed internal hemorrhoids, grade 3 07/04/2014   Pulmonary emboli ([place]) ~ 2012   Right arm weakness 10/30/2015   Past Surgical History:  Procedure Laterality Date   ABDOMINAL HYSTERECTOMY  ~ 1979   "partial"   CATARACT EXTRACTION W/ INTRAOCULAR LENS  IMPLANT, BILATERAL Bilateral 2017   DILATION AND CURETTAGE OF UTERUS     TONSILLECTOMY  1950    SOCIAL HISTORY:  Social History   Socioeconomic History   Marital status: Married    Spouse name: Not on file   Number of children: 1   Years of education: MA   Highest education level: Not on file  Occupational History   Occupation: retired    Comment: middle school principle   Tobacco Use   Smoking status: Never   Smokeless tobacco: Never  Scientific laboratory technician Use: Never used  Substance and Sexual Activity   Alcohol use: No   Drug use: No   Sexual activity: Never  Other Topics Concern   Not on file  Social History Narrative   Lives with  husband-married 1 years       1 son- no grandkids    Right handed       Enjoys: reads, cleaned and organized, piano       Diet: eats all foods groups and tries to balanced diet well-occasionally hot dog and red meat   Caffeine: hot chocolate, green tea   Water: 3 liters      Exercise 55 minutes 5 days of week       Wears seat belt   Smoke detectors at home   Does not use phone while driving   Social Determinants of Radio broadcast assistant Strain: Low Risk    Difficulty of Paying Living Expenses: Not hard at all  Food Insecurity: No Food Insecurity   Worried About Charity fundraiser in the Last Year: Never true   Arboriculturist in the Last Year: Never true  Transportation Needs: No Transportation Needs   Lack of Transportation (Medical): No   Lack of Transportation (Non-Medical): No  Physical Activity: Sufficiently Active   Days of Exercise per Week: 4 days   Minutes of Exercise per Session: 40 min  Stress: No Stress Concern Present   Feeling of Stress : Not at all  Social Connections: Socially Integrated   Frequency of Communication with Friends and Family: More  than three times a week   Frequency of Social Gatherings with Friends and Family: More than three times a week   Attends Religious Services: More than 4 times per year   Active Member of Clubs or Organizations: No   Attends Music therapist: 1 to 4 times per year   Marital Status: Married  Human resources officer Violence: Not At Risk   Fear of Current or Ex-Partner: No   Emotionally Abused: No   Physically Abused: No   Sexually Abused: No    FAMILY HISTORY:  Family History  Problem Relation Age of Onset   Stroke Mother    Heart disease Mother    Hypertension Maternal Grandmother     CURRENT MEDICATIONS:  Current Outpatient Medications  Medication Sig Dispense Refill   apixaban (ELIQUIS) 2.5 MG TABS tablet Take 1 tablet (2.5 mg total) by mouth 2 (two) times daily. 60 tablet 3   Calcium  500 MG tablet Take 1 tablet (500 mg total) by mouth 3 (three) times daily with meals. 90 tablet 3   Calcium Carb-Cholecalciferol (OYSTER SHELL CALCIUM W/D) 500-200 MG-UNIT TABS Take 1 tablet by mouth 3 (three) times daily.     Cholecalciferol (VITAMIN D3) 1.25 MG (50000 UT) CAPS Take 1 capsule by mouth daily.     lisinopril (ZESTRIL) 10 MG tablet Take 1 tablet by mouth daily.     losartan (COZAAR) 50 MG tablet TAKE 1 TABLET BY MOUTH EVERY DAY 90 tablet 1   Multiple Vitamins-Minerals (MULTIVITAMINS THER. W/MINERALS) TABS Take 1 tablet by mouth every morning.     omega-3 acid ethyl esters (LOVAZA) 1 g capsule Take 1 capsule by mouth 2 (two) times daily.     Oyster Shell Calcium 500 MG TABS TAKE 1 TABLET (500 MG TOTAL) BY MOUTH 3 (THREE) TIMES DAILY WITH MEALS. 270 tablet 1   pantoprazole (PROTONIX) 40 MG tablet Take 40 mg by mouth daily.     rosuvastatin (CRESTOR) 10 MG tablet TAKE 1 TABLET BY MOUTH EVERY DAY 90 tablet 3   No current facility-administered medications for this visit.    ALLERGIES:  No Known Allergies  PHYSICAL EXAM:  Performance status (ECOG): 1 - Symptomatic but completely ambulatory  There were no vitals filed for this visit. Wt Readings from Last 3 Encounters:  02/12/21 182 lb 3.2 oz (82.6 kg)  01/21/21 180 lb 6.4 oz (81.8 kg)  01/15/21 181 lb 9.6 oz (82.4 kg)   Physical Exam Vitals reviewed.  Constitutional:      Appearance: Normal appearance.  Cardiovascular:     Rate and Rhythm: Normal rate and regular rhythm.     Pulses: Normal pulses.     Heart sounds: Normal heart sounds.  Pulmonary:     Effort: Pulmonary effort is normal.     Breath sounds: Normal breath sounds.  Neurological:     General: No focal deficit present.     Mental Status: She is alert and oriented to person, place, and time.  Psychiatric:        Mood and Affect: Mood normal.        Behavior: Behavior normal.    LABORATORY DATA:  I have reviewed the labs as listed.  CBC Latest Ref Rng  & Units 05/15/2021 01/15/2021 02/14/2020  WBC 4.0 - 10.5 K/uL 5.4 5.7 6.5  Hemoglobin 12.0 - 15.0 g/dL 12.0 13.0 13.7  Hematocrit 36.0 - 46.0 % 37.6 41.5 41.3  Platelets 150 - 400 K/uL 220 214 221   CMP Latest Ref Rng &  Units 05/15/2021 01/15/2021 11/09/2020  Glucose 70 - 99 mg/dL 91 87 117(H)  BUN 8 - 23 mg/dL 27(H) 25(H) 35(H)  Creatinine 0.44 - 1.00 mg/dL 1.29(H) 1.39(H) 1.62(H)  Sodium 135 - 145 mmol/L 138 137 135  Potassium 3.5 - 5.1 mmol/L 4.6 4.1 4.2  Chloride 98 - 111 mmol/L 108 105 106  CO2 22 - 32 mmol/L '24 26 23  '$ Calcium 8.9 - 10.3 mg/dL 9.0 9.9 9.1  Total Protein 6.5 - 8.1 g/dL 7.4 8.0 -  Total Bilirubin 0.3 - 1.2 mg/dL 0.3 0.4 -  Alkaline Phos 38 - 126 U/L 59 55 -  AST 15 - 41 U/L 30 32 -  ALT 0 - 44 U/L 30 33 -      Component Value Date/Time   RBC 3.99 05/15/2021 1302   MCV 94.2 05/15/2021 1302   MCH 30.1 05/15/2021 1302   MCHC 31.9 05/15/2021 1302   RDW 14.3 05/15/2021 1302   LYMPHSABS 1.1 05/15/2021 1302   MONOABS 0.4 05/15/2021 1302   EOSABS 0.2 05/15/2021 1302   BASOSABS 0.0 05/15/2021 1302    DIAGNOSTIC IMAGING:  I have independently reviewed the scans and discussed with the patient. No results found.   ASSESSMENT:  1.  IgA Lambda MGUS: - Patient seen at the request of Dr. Theador Hawthorne. - Labs done at his office on 10/22/2020 for work-up of CKD showed faint IgA lambda monoclonal immunoglobulin in urine. - She denies any fevers, night sweats or weight loss.  No new bone lesions.  No neuropathy symptoms.  No recurrent infections noted.   2.  Social/family history: - She lives at home with her husband. 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  Non-smoker.  No exposure to chemicals. -Maternal cousin had myeloma.   PLAN:  1.  IgA Lambda MGUS: - She does not report any fevers, night sweats or weight loss.  No infections reported. - Reviewed labs from 05/15/2021.  M spike is negative.  Immunofixation was normal.  Free light chain ratio is slightly high at 1.87 and kappa  light chains at 37.1.  Skeletal survey from 04/17/2021 did not show any lytic lesions. - 24-hour urine from 04/11/2021 was negative for immunofixation and M spike. - CMP shows creatinine 1.29, improved from previous value.  Calcium was normal.  Hemoglobin was 12. - Her immunofixation is waxing and waning.  Mildly elevated free light chain ratio could be secondary to CKD.  I have recommended follow-up in a year with repeat labs to look for any evolving plasma cell disorder..   2.  CKD: - Stage IIIb CKD since 2009, thought to be secondary to hypertension and age associated decline in GFR. - Proteinuria was less than 100 mg per 24 hours. - She will continue follow-up with Dr. Theador Hawthorne.  Orders placed this encounter:  No orders of the defined types were placed in this encounter.    Derek Jack, MD [place] [phone]   I, Thana Ates, am acting as a scribe for Dr. Derek Jack.  I, Derek Jack MD, have reviewed the above documentation for accuracy and completeness, and I agree with the above.

## 2021-05-22 ENCOUNTER — Other Ambulatory Visit: Payer: Self-pay

## 2021-05-22 ENCOUNTER — Inpatient Hospital Stay (HOSPITAL_BASED_OUTPATIENT_CLINIC_OR_DEPARTMENT_OTHER): Payer: Medicare Other | Admitting: Hematology

## 2021-05-22 DIAGNOSIS — D472 Monoclonal gammopathy: Secondary | ICD-10-CM | POA: Diagnosis not present

## 2021-05-22 NOTE — Patient Instructions (Addendum)
[facility] at [facility] Discharge Instructions  You were seen today by Dr. Katragadda. He went over your recent results. Dr. Katragadda will see you back in 1 year for labs and follow up.   Thank you for choosing  Can[facility] Center at [facility] to provide your oncology and hematology care.  To afford each patient quality time with our provider, please arrive at least 15 minutes before your scheduled appointment time.   If you have a lab appointment with the Cancer Center please come in thru the Main Entrance and check in at the main information desk  You need to re-schedule your appointment should you arrive 10 or more minutes late.  We strive to give you quality time with our providers, and arriving late affects you and other patients whose appointments are after yours.  Also, if you no show three or more times for appointments you may be dismissed from the clinic at the providers discretion.     Again, thank you for choosing [facility].  Our hope is that these requests will decrease the amount of time that you wait before being seen by our physicians.       _____________________________________________________________  Should you have questions after your visit to [facility], please contact our office at [phone] between the hours of 8:00 a.m. and 4:30 p.m.  Voicemails left after 4:00 p.m. will not be returned until the following business day.  For prescription refill requests, have your pharmacy contact our office and allow 72 hours.    Cancer Center Support Programs:   > Cancer Support Group  2nd Tuesday of the month 1pm-2pm, Journey Room    

## 2021-06-17 ENCOUNTER — Other Ambulatory Visit: Payer: Self-pay | Admitting: Family Medicine

## 2021-06-17 DIAGNOSIS — M810 Age-related osteoporosis without current pathological fracture: Secondary | ICD-10-CM

## 2021-08-28 ENCOUNTER — Other Ambulatory Visit: Payer: Self-pay | Admitting: Internal Medicine

## 2021-08-28 DIAGNOSIS — Z7901 Long term (current) use of anticoagulants: Secondary | ICD-10-CM

## 2021-08-28 DIAGNOSIS — Z86718 Personal history of other venous thrombosis and embolism: Secondary | ICD-10-CM

## 2021-08-28 MED ORDER — APIXABAN 2.5 MG PO TABS: 2.5000 mg | ORAL_TABLET | Freq: Two times a day (BID) | ORAL | 0 refills | Status: DC

## 2021-09-30 ENCOUNTER — Other Ambulatory Visit: Payer: Self-pay | Admitting: Internal Medicine

## 2021-09-30 DIAGNOSIS — Z86718 Personal history of other venous thrombosis and embolism: Secondary | ICD-10-CM

## 2021-09-30 DIAGNOSIS — Z7901 Long term (current) use of anticoagulants: Secondary | ICD-10-CM

## 2021-10-04 ENCOUNTER — Encounter: Payer: Self-pay | Admitting: Nurse Practitioner

## 2021-10-04 ENCOUNTER — Ambulatory Visit (INDEPENDENT_AMBULATORY_CARE_PROVIDER_SITE_OTHER): Payer: Medicare Other | Admitting: Nurse Practitioner

## 2021-10-04 ENCOUNTER — Other Ambulatory Visit: Payer: Self-pay

## 2021-10-04 VITALS — BP 101/71 | HR 67 | Resp 17 | Ht 66.0 in | Wt 183.0 lb

## 2021-10-04 DIAGNOSIS — I10 Essential (primary) hypertension: Secondary | ICD-10-CM | POA: Diagnosis not present

## 2021-10-04 DIAGNOSIS — R7303 Prediabetes: Secondary | ICD-10-CM | POA: Diagnosis not present

## 2021-10-04 DIAGNOSIS — N1832 Chronic kidney disease, stage 3b: Secondary | ICD-10-CM

## 2021-10-04 DIAGNOSIS — R5383 Other fatigue: Secondary | ICD-10-CM

## 2021-10-04 DIAGNOSIS — E785 Hyperlipidemia, unspecified: Secondary | ICD-10-CM | POA: Diagnosis not present

## 2021-10-04 NOTE — Progress Notes (Signed)
° °  Nicole Good     MRN: [id number]      DOB: 1941-08-13   HPI Nicole Good is here for follow up and re-evaluation of chronic medical conditions, medication management and review of any available recent lab and radiology data.  Preventive health is updated, specifically  Cancer screening and Immunization.   Questions or concerns regarding consultations or procedures which the PT has had in the interim are  addressed. The PT denies any adverse reactions to current medications since the last visit.  There are no new concerns.  There are no specific complaints   She has been staying sleepy all the time sine about three months ago. She has been pushing her self to do things , she walks out several day a week. She sleeps well at night.   She sprained her back 2 weeks ago while bending to pick up something, she has seen othopedics and her back feels much better, she wears a brace when she is at home.   Vitamin d level done by nephrology  was 46 on 09/26/21, CBC was normal.  Labs were reviewed by me  Patient states that she is up-to-date with her COVID vaccines but she does not have a COVID card.  Patient educated on the need for shingles vaccine and Tdap vaccine she verbalized understanding patient will get vaccines at her pharmacy.   ROS Denies recent fever or chills, feels fatigued and sleepy Denies sinus pressure, nasal congestion, ear pain or sore throat, Denies chest congestion, productive cough or wheezing. Denies chest pains, palpitations and leg swelling Denies abdominal pain, nausea, vomiting,diarrhea or constipation.   Denies dysuria, frequency, hesitancy or incontinence. Denies joint pain, swelling and limitation in mobility. Denies headaches, seizures, numbness, or tingling. Denies depression, anxiety or insomnia. Denies skin break down or rash.   PE  BP 101/71    Pulse 67    Resp 17    Ht 5\' 6"  (1.676 m)    Wt 183 lb (83 kg)    SpO2 95%    BMI 29.54 kg/m   Patient  alert and oriented and in no cardiopulmonary distress.  HEENT: No facial asymmetry, EOMI   Neck supple .  Chest: Clear to auscultation bilaterally.  CVS: S1, S2 no murmurs, no S3.Regular rate.  ABD: Soft non tender.   Ext: No edema  MS: Adequate ROM spine, shoulders, hips and knees.  Skin: Intact, no ulcerations or rash noted.  Psych: Good eye contact, normal affect. Memory intact not anxious or depressed appearing.  CNS: CN 2-12 intact, power,  normal throughout.no focal deficits noted.   Assessment & Plan

## 2021-10-04 NOTE — Assessment & Plan Note (Signed)
Followed by nephrology. eGFR today. Avoid nephrotoxic agents. Drink plenty of water to stay hydrated.

## 2021-10-04 NOTE — Patient Instructions (Addendum)
Pleas get your shingles vaccine and tdap vaccine at your pharmacy.   It is important that you exercise regularly at least 30 minutes 5 times a week.  Think about what you will eat, plan ahead. Choose " clean, green, fresh or frozen" over canned, processed or packaged foods which are more sugary, salty and fatty. 70 to 75% of food eaten should be vegetables and fruit. Three meals at set times with snacks allowed between meals, but they must be fruit or vegetables. Aim to eat over a 12 hour period , example 7 am to 7 pm, and STOP after  your last meal of the day. Drink water,generally about 64 ounces per day, no other drink is as healthy. Fruit juice is best enjoyed in a healthy way, by EATING the fruit.  Thanks for choosing [facility] we consider it a privelige to serve you.

## 2021-10-04 NOTE — Assessment & Plan Note (Signed)
A1c ordered today.  Lab Results  Component Value Date   HGBA1C 6.1 (H) 11/29/2019

## 2021-10-04 NOTE — Assessment & Plan Note (Signed)
Lipid panel ordered today patient gets labs done on Monday

## 2021-10-04 NOTE — Assessment & Plan Note (Signed)
DASH diet and commitment to daily physical activity for a minimum of 30 minutes discussed and encouraged, as a part of hypertension management. The importance of attaining a healthy weight is also discussed.  BP/Weight 10/04/2021 02/12/2021 01/21/2021 01/15/2021 10/25/2020 09/16/2020 32/10/5670  Systolic BP 091 980 221 798 102 548 628  Diastolic BP 71 241 84 89 87 106 58  Wt. (Lbs) 183 182.2 180.4 181.6 184.4 178 176  BMI 29.54 29.86 29.56 29.76 30.22 29.17 29.29  well controlled Continue current meds.

## 2021-10-04 NOTE — Progress Notes (Signed)
83 ° °

## 2021-10-04 NOTE — Assessment & Plan Note (Signed)
Chronic complaint, patient denies anxiety or depression Check TSH and A1c.

## 2021-10-08 LAB — CMP14+EGFR
ALT: 21 IU/L (ref 0–32)
AST: 23 IU/L (ref 0–40)
Albumin/Globulin Ratio: 1.8 (ref 1.2–2.2)
Albumin: 4.4 g/dL (ref 3.6–4.6)
Alkaline Phosphatase: 70 IU/L (ref 44–121)
BUN/Creatinine Ratio: 18 (ref 12–28)
BUN: 26 mg/dL (ref 8–27)
Bilirubin Total: 0.3 mg/dL (ref 0.0–1.2)
CO2: 24 mmol/L (ref 20–29)
Calcium: 9.7 mg/dL (ref 8.7–10.3)
Chloride: 106 mmol/L (ref 96–106)
Creatinine, Ser: 1.43 mg/dL — ABNORMAL HIGH (ref 0.57–1.00)
Globulin, Total: 2.4 g/dL (ref 1.5–4.5)
Glucose: 90 mg/dL (ref 70–99)
Potassium: 4.8 mmol/L (ref 3.5–5.2)
Sodium: 142 mmol/L (ref 134–144)
Total Protein: 6.8 g/dL (ref 6.0–8.5)
eGFR: 37 mL/min/{1.73_m2} — ABNORMAL LOW (ref >59)

## 2021-10-08 LAB — LIPID PANEL
Chol/HDL Ratio: 4.5 ratio — ABNORMAL HIGH (ref 0.0–4.4)
Cholesterol, Total: 166 mg/dL (ref 100–199)
HDL: 37 mg/dL — ABNORMAL LOW (ref >39)
LDL Chol Calc (NIH): 99 mg/dL (ref 0–99)
Triglycerides: 173 mg/dL — ABNORMAL HIGH (ref 0–149)
VLDL Cholesterol Cal: 30 mg/dL (ref 5–40)

## 2021-10-08 LAB — TSH: TSH: 0.601 u[IU]/mL (ref 0.450–4.500)

## 2021-10-08 LAB — HEMOGLOBIN A1C
Est. average glucose Bld gHb Est-mCnc: 128 mg/dL
Hgb A1c MFr Bld: 6.1 % — ABNORMAL HIGH (ref 4.8–5.6)

## 2021-10-08 NOTE — Progress Notes (Signed)
Pls review labs with pt.  Her labs are stable.   Pt is prediabetic, avoid sugars sweet, soda  Thyroid function is normal.  No changes to her medication. Thanks

## 2021-10-09 ENCOUNTER — Telehealth: Payer: Self-pay

## 2021-10-09 NOTE — Telephone Encounter (Signed)
Patient returning call about lab results.  [phone]

## 2021-10-09 NOTE — Telephone Encounter (Signed)
LVM for pt to call the office regarding lab results

## 2021-10-10 ENCOUNTER — Telehealth: Payer: Self-pay | Admitting: Internal Medicine

## 2021-10-10 NOTE — Telephone Encounter (Signed)
PT lvm that yall are playing phone tag- Please call the pt back

## 2021-11-20 ENCOUNTER — Telehealth: Payer: Self-pay

## 2021-11-20 NOTE — Telephone Encounter (Signed)
Patient called needs to know when to stop taking the Eliuqis medicine, she is having eye lid lift surgery on Tuesday, 03.23.2023.  call back # [phone]. ?

## 2021-11-21 ENCOUNTER — Telehealth: Payer: Self-pay | Admitting: *Deleted

## 2021-11-21 NOTE — Chronic Care Management (AMB) (Signed)
?  Chronic Care Management  ? ?Outreach Note ? ?11/21/2021 ?Name: Nicole Good MRN: [id number] DOB: 27-Oct-1940 ? ?Nicole Good is a 81 y.o. year old female who is a primary care patient of Lindell Spar, MD. I reached out to Nicole Good by phone today in response to a referral sent by Nicole Good primary care provider. ? ?An unsuccessful telephone outreach was attempted today. The patient was referred to the case management team for assistance with care management and care coordination.  ? ?Follow Up Plan: A HIPAA compliant phone message was left for the patient providing contact information and requesting a return call.  ?The care management team will reach out to the patient again over the next 7 days.  ?If patient returns call to provider office, please advise to call [place]* at [phone].* ? ?Nicole Good  ?Care Guide, Embedded Care Coordination ?[place]  Care Management  ?Direct Dial: [phone] ? ?

## 2021-11-22 ENCOUNTER — Other Ambulatory Visit: Payer: Self-pay | Admitting: Internal Medicine

## 2021-11-22 DIAGNOSIS — Z86718 Personal history of other venous thrombosis and embolism: Secondary | ICD-10-CM

## 2021-11-22 DIAGNOSIS — Z7901 Long term (current) use of anticoagulants: Secondary | ICD-10-CM

## 2021-11-22 DIAGNOSIS — I10 Essential (primary) hypertension: Secondary | ICD-10-CM

## 2021-11-22 NOTE — Chronic Care Management (AMB) (Signed)
?  Chronic Care Management  ? ?Note ? ?11/22/2021 ?Name: Nicole Good MRN: [id number] DOB: March 26, 1941 ? ?Nicole Good is a 81 y.o. year old female who is a primary care patient of Lindell Spar, MD. I reached out to [name] by phone today in response to a referral sent by [name] PCP. ? ?[name] was given information about Chronic Care Management services today including:  ?CCM service includes personalized support from designated clinical staff supervised by her physician, including individualized plan of care and coordination with other care providers ?24/7 contact phone numbers for assistance for urgent and routine care needs. ?Service will only be billed when office clinical staff spend 20 minutes or more in a month to coordinate care. ?Only one practitioner may furnish and bill the service in a calendar month. ?The patient may stop CCM services at any time (effective at the end of the month) by phone call to the office staff. ?The patient is responsible for co-pay (up to 20% after annual deductible is met) if co-pay is required by the individual health plan.  ? ?Patient agreed to services and verbal consent obtained.  ? ?Follow up plan: ?Telephone appointment with care management team member scheduled for:12/05/21 ? ?Laverda Sorenson  ?Care Guide, Embedded Care Coordination ?[place]  Care Management  ?Direct Dial: [phone] ? ?

## 2021-11-25 NOTE — Telephone Encounter (Signed)
Pt advised with verbal understanding  °

## 2021-11-27 ENCOUNTER — Other Ambulatory Visit: Payer: Self-pay | Admitting: *Deleted

## 2021-11-27 ENCOUNTER — Telehealth: Payer: Self-pay | Admitting: Internal Medicine

## 2021-11-27 ENCOUNTER — Telehealth: Payer: Medicare Other | Admitting: Nurse Practitioner

## 2021-11-27 MED ORDER — PANTOPRAZOLE SODIUM 40 MG PO TBEC: 40.0000 mg | DELAYED_RELEASE_TABLET | Freq: Every day | ORAL | 0 refills | Status: DC

## 2021-11-27 NOTE — Telephone Encounter (Signed)
Medication sent to pharmacy  

## 2021-11-27 NOTE — Telephone Encounter (Signed)
Patient called in for refill on  ? ?pantoprazole (PROTONIX) 40 MG tablet  ?

## 2021-12-05 ENCOUNTER — Ambulatory Visit (INDEPENDENT_AMBULATORY_CARE_PROVIDER_SITE_OTHER): Payer: Medicare Other | Admitting: *Deleted

## 2021-12-05 DIAGNOSIS — I10 Essential (primary) hypertension: Secondary | ICD-10-CM

## 2021-12-05 DIAGNOSIS — E785 Hyperlipidemia, unspecified: Secondary | ICD-10-CM

## 2021-12-05 NOTE — Patient Instructions (Signed)
Visit Information ? ?Thank you for taking time to visit with me today. Please don't hesitate to contact me if I can be of assistance to you before our next scheduled telephone appointment. ? ?Following are the goals we discussed today:  ?Take medications as prescribed   ?Attend all scheduled provider appointments ?keep all doctor appointments ?take all medications exactly as prescribed ?You will be contacted to reschedule today's visit so RN care manager can finish assessment ?Someone will contact you about cost of eliquis ? ?Our next appointment is - care guide will call you to reschedule ? ?Please call the care guide team at [phone] if you need to cancel or reschedule your appointment.  ? ?If you are experiencing a Mental Health or [place] or need someone to talk to, please call the Suicide and Crisis Lifeline: 988 ?call the [place] National Suicide Prevention Lifeline: 2178549060 or TTY: 304-713-2725 TTY (947) 025-8465) to talk to a trained counselor ?call 1-800-273-TALK (toll free, 24 hour hotline) ?go to [facility] [address], [place] [phone]) ?call 911  ? ?Following is a copy of your full plan of care:  ?Care Plan : [place] of Care  ?Updates made by Nicole Mends, RN since 12/05/2021 12:00 AM  ?  ? ?Problem: No plan of care established for management of chronic disease state  (HTN, HLD)   ?Priority: High  ?  ? ?Long-Range Goal: Development of plan of care for chronic disease management  (HTN, HLD)   ?Start Date: 12/05/2021  ?Expected End Date: 06/03/2022  ?Priority: High  ?Note:   ?Current Barriers:  ?Knowledge Deficits related to plan of care for management of HTN and HLD  ?Spoke with patient who is hesitant to participate with care management services but agreed to continue,  pt states " my biggest need is I can't afford eliquis, it's 133$/ month"  Pt states having difficulty hearing RN care manager over the phone, RN care  manager ask pt if okay to hang up and call her back, this was tried and patient's line was busy afterward with multiple attempts.  Care guide notified to contact pt to reschedule initial outreach so assessment can be completed. ? ?RNCM Clinical Goal(s):  ?Patient will verbalize understanding of plan for management of HTN and HLD as evidenced by pt report, review of EHR and  through collaboration with RN Care manager, provider, and care team.  ? ?Interventions: ?1:1 collaboration with primary care provider regarding development and update of comprehensive plan of care as evidenced by provider attestation and co-signature ?Inter-disciplinary care team collaboration (see longitudinal plan of care) ?Evaluation of current treatment plan related to  self management and patient's adherence to plan as established by provider ? ? ?Hyperlipidemia:  (Status: New goal. Goal on Track (progressing): YES.) Long Term Goal  ?Lab Results  ?Component Value Date  ? CHOL 166 10/07/2021  ? HDL 37 (L) 10/07/2021  ? [place] 99 10/07/2021  ? TRIG 173 (H) 10/07/2021  ? CHOLHDL 4.5 (H) 10/07/2021  ?Medication review performed; medication list updated in electronic medical record.  ?Provider established cholesterol goals reviewed; ? ? ?Hypertension Interventions:  (Status:  New goal. and Goal on track:  Yes.) Long Term Goal ?Last practice recorded BP readings:  ?BP Readings from Last 3 Encounters:  ?10/04/21 101/71  ?02/12/21 (!) 148/105  ?01/21/21 (!) 152/84  ?Most recent eGFR/CrCl:  ?Lab Results  ?Component Value Date  ? EGFR 37 (L) 10/07/2021  ?  No components  found for: CRCL ? ?Evaluation of current treatment plan related to hypertension self management and patient's adherence to plan as established by provider  ?Referral to pharmacy for assistance with cost of eliquis ? ?Patient Goals/Self-Care Activities: ?Take medications as prescribed   ?Attend all scheduled provider appointments ?keep all doctor appointments ?- take all medications  exactly as prescribed ?You will be contacted to reschedule today's visit so RN care manager can finish assessment ?Someone will contact you about cost of eliquis ? ? ?  ? ? ?Nicole Good was given information about Care Management services by the embedded care coordination team including:  ?Care Management services include personalized support from designated clinical staff supervised by her physician, including individualized plan of care and coordination with other care providers ?24/7 contact phone numbers for assistance for urgent and routine care needs. ?The patient may stop CCM services at any time (effective at the end of the month) by phone call to the office staff. ? ?Patient agreed to services and verbal consent obtained.  ? ?Patient was unable to verbalize understanding of instructions given today due to poor connection with telephone and RN care manager unable to reach patient after multiple attempts. ? ?Telephone follow up appointment with care management team member scheduled for: upon care guide rescheduling. ? ?Nicole Good RNC, BSN ?RN Case Manager ?[facility] ?[phone] ? ? ?  ?

## 2021-12-05 NOTE — Chronic Care Management (AMB) (Signed)
? Care Management ?  ? RN Visit Note ? ?12/05/2021 ?Name: Nicole Good MRN: [id number] DOB: May 09, 1941 ? ?Subjective: ?Nicole Good is a 81 y.o. year old female who is a primary care patient of Nicole Spar, MD. The care management team was consulted for assistance with disease management and care coordination needs.   ? ?Engaged with patient by telephone for initial visit in response to provider referral for case management and/or care coordination services.  ? ?Consent to Services:  ? [name] was given information about Care Management services today including:  ?Care Management services includes personalized support from designated clinical staff supervised by her physician, including individualized plan of care and coordination with other care providers ?24/7 contact phone numbers for assistance for urgent and routine care needs. ?The patient may stop case management services at any time by phone call to the office staff. ? ?Patient agreed to services and consent obtained.  ? ?Assessment: Review of patient past medical history, allergies, medications, health status, including review of consultants reports, laboratory and other test data, was performed as part of comprehensive evaluation and provision of chronic care management services.  ? ?SDOH (Social Determinants of Health) assessments and interventions performed:   ? ?Care Plan ? ?No Known Allergies ? ?Outpatient Encounter Medications as of 12/05/2021  ?Medication Sig  ? Calcium 500 MG tablet Take 1 tablet (500 mg total) by mouth 3 (three) times daily with meals.  ? Calcium Carb-Cholecalciferol (OYSTER SHELL CALCIUM W/D) 500-200 MG-UNIT TABS TAKE 1 TABLET BY MOUTH 3 TIMES DAILY WITH MEALS.  ? Cholecalciferol (VITAMIN D3) 1.25 MG (50000 UT) CAPS Take 1 capsule by mouth daily.  ? ELIQUIS 2.5 MG TABS tablet TAKE 1 TABLET BY MOUTH TWICE A DAY  ? lisinopril (ZESTRIL) 10 MG tablet Take 1 tablet by mouth daily.  ? losartan (COZAAR) 50 MG tablet TAKE 1  TABLET BY MOUTH EVERY DAY  ? Multiple Vitamins-Minerals (MULTIVITAMINS THER. W/MINERALS) TABS Take 1 tablet by mouth every morning.  ? omega-3 acid ethyl esters (LOVAZA) 1 g capsule Take 1 capsule by mouth 2 (two) times daily.  ? Oyster Shell Calcium 500 MG TABS TAKE 1 TABLET (500 MG TOTAL) BY MOUTH 3 (THREE) TIMES DAILY WITH MEALS.  ? pantoprazole (PROTONIX) 40 MG tablet Take 1 tablet (40 mg total) by mouth daily.  ? rosuvastatin (CRESTOR) 10 MG tablet TAKE 1 TABLET BY MOUTH EVERY DAY  ? ?No facility-administered encounter medications on file as of 12/05/2021.  ? ? ?Patient Active Problem List  ? Diagnosis Date Noted  ? Hyperlipidemia 10/04/2021  ? 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 Abnormal EKG 02/22/2020  ? Ventricular premature beats 02/22/2020  ? Current mild episode of major depressive disorder without prior episode ([place]) 02/22/2020  ? Prediabetes 11/24/2019  ? Post-menopausal 11/24/2019  ? Fatigue 08/23/2019  ? Stage 3b chronic kidney disease ([place]) 07/29/2019  ? Barrett's esophagus without dysplasia 07/27/2018  ? Gastritis 07/27/2018  ? Sleep apnea 06/02/2018  ? Dry eye syndrome of both lacrimal glands 12/06/2017  ? 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 Bilateral pseudophakia 12/06/2017  ? Other pulmonary embolism without acute cor pulmonale ([place]) 12/04/2015  ? Anticoagulant long-term use 12/04/2015  ? Deep venous thrombosis ([place]) 12/03/2015  ? Personal history of pulmonary embolism 12/03/2015  ? H/O deep venous thrombosis 12/03/2015  ? Osteoarthritis, knee 05/06/2012  ? Body mass index (BMI) 26.0-26.9, adult 02/05/2012  ? Essential hypertension 03/21/2011  ? Hypercholesterolemia 03/21/2011  ? Benign  lipomatous neoplasm, unspecified 03/21/2011  ? Osteoporosis 03/21/2011  ? ? ?Conditions to be addressed/monitored: HTN and HLD ? ?Care Plan : RN Care Manager Plan of Care  ?Updates made by Kassie Mends, RN since 12/05/2021 12:00 AM  ?  ? ?Problem: No plan of care  established for management of chronic disease state  (HTN, HLD)   ?Priority: High  ?  ? ?Long-Range Goal: Development of plan of care for chronic disease management  (HTN, HLD)   ?Start Date: 12/05/2021  ?Expected End Date: 06/03/2022  ?Priority: High  ?Note:   ?Current Barriers:  ?Knowledge Deficits related to plan of care for management of HTN and HLD  ?Spoke with patient who is hesitant to participate with care management services but agreed to continue,  pt states " my biggest need is I can't afford eliquis, it's 133$/ month"  Pt states having difficulty hearing RN care manager over the phone, RN care manager ask pt if okay to hang up and call her back, this was tried and patient's line was busy afterward with multiple attempts.  Care guide notified to contact pt to reschedule initial outreach so assessment can be completed. ? ?RNCM Clinical Goal(s):  ?Patient will verbalize understanding of plan for management of HTN and HLD as evidenced by pt report, review of EHR and  through collaboration with RN Care manager, provider, and care team.  ? ?Interventions: ?1:1 collaboration with primary care provider regarding development and update of comprehensive plan of care as evidenced by provider attestation and co-signature ?Inter-disciplinary care team collaboration (see longitudinal plan of care) ?Evaluation of current treatment plan related to  self management and patient's adherence to plan as established by provider ? ? ?Hyperlipidemia:  (Status: New goal. Goal on Track (progressing): YES.) Long Term Goal  ?Lab Results  ?Component Value Date  ? CHOL 166 10/07/2021  ? HDL 37 (L) 10/07/2021  ? [place] 99 10/07/2021  ? TRIG 173 (H) 10/07/2021  ? CHOLHDL 4.5 (H) 10/07/2021  ?Medication review performed; medication list updated in electronic medical record.  ?Provider established cholesterol goals reviewed; ? ? ?Hypertension Interventions:  (Status:  New goal. and Goal on track:  Yes.) Long Term Goal ?Last practice  recorded BP readings:  ?BP Readings from Last 3 Encounters:  ?10/04/21 101/71  ?02/12/21 (!) 148/105  ?01/21/21 (!) 152/84  ?Most recent eGFR/CrCl:  ?Lab Results  ?Component Value Date  ? EGFR 37 (L) 10/07/2021  ?  No components found for: CRCL ? ?Evaluation of current treatment plan related to hypertension self management and patient's adherence to plan as established by provider  ?Referral to pharmacy for assistance with cost of eliquis ? ?Patient Goals/Self-Care Activities: ?Take medications as prescribed   ?Attend all scheduled provider appointments ?keep all doctor appointments ?- take all medications exactly as prescribed ?You will be contacted to reschedule today's visit so RN care manager can finish assessment ?Someone will contact you about cost of eliquis ? ? ?  ? ? ?Plan: Telephone follow up appointment with care management team member scheduled for:  upon care guide rescheduling to complete initial assessment, RN care manager was unable to complete today due to poor connection with the phone and then patient's line was busy after multiple attempts. ? ?Jacqlyn Larsen RNC, BSN ?RN Case Manager ?[facility] ?[phone] ? ? ? ? ? ? ? ? ? ? ?

## 2021-12-06 DIAGNOSIS — I10 Essential (primary) hypertension: Secondary | ICD-10-CM | POA: Diagnosis not present

## 2021-12-06 DIAGNOSIS — E785 Hyperlipidemia, unspecified: Secondary | ICD-10-CM | POA: Diagnosis not present

## 2021-12-09 ENCOUNTER — Ambulatory Visit (INDEPENDENT_AMBULATORY_CARE_PROVIDER_SITE_OTHER): Payer: Medicare Other

## 2021-12-09 DIAGNOSIS — Z0000 Encounter for general adult medical examination without abnormal findings: Secondary | ICD-10-CM

## 2021-12-09 NOTE — Patient Instructions (Signed)
?  Ms. Devall , ?Thank you for taking time to come for your Medicare Wellness Visit. I appreciate your ongoing commitment to your health goals. Please review the following plan we discussed and let me know if I can assist you in the future.  ? ?These are the goals we discussed: ? Goals   ? ?  Patient Stated   ?  Patient states that her goal is be healthy, live happy, and help people. ?  ? ?  ?  ?This is a list of the screening recommended for you and due dates:  ?Health Maintenance  ?Topic Date Due  ? COVID-19 Vaccine (1) Never done  ? Zoster (Shingles) Vaccine (1 of 2) Never done  ? Tetanus Vaccine  05/30/2020  ? Flu Shot  04/08/2022  ? Pneumonia Vaccine  Completed  ? DEXA scan (bone density measurement)  Completed  ? HPV Vaccine  Aged Out  ?  ?

## 2021-12-09 NOTE — Progress Notes (Signed)
? ?Subjective:  ? Nicole Good is a 81 y.o. female who presents for Medicare Annual (Subsequent) preventive examination. ?I connected with  Nicole Good on 12/09/21 by a audio enabled telemedicine application and verified that I am speaking with the correct person using two identifiers. ? ?Patient Location: Home ? ?Provider Location: Office/Clinic ? ?I discussed the limitations of evaluation and management by telemedicine. The patient expressed understanding and agreed to proceed.  ?Review of Systems    ? ?Cardiac Risk Factors include: advanced age (>14mn, >>46women);hypertension ? ?   ?Objective:  ?  ?There were no vitals filed for this visit. ?There is no height or weight on file to calculate BMI. ? ? ?  12/09/2021  ?  9:06 AM 05/22/2021  ?  3:47 PM 02/12/2021  ?  3:02 PM 01/15/2021  ? 12:51 PM 09/16/2020  ?  9:44 AM 08/12/2020  ? 11:02 AM 04/04/2020  ? 10:02 AM  ?Advanced Directives  ?Does Patient Have a Medical Advance Directive? Yes Yes Yes Yes Yes No Yes  ?Type of Advance Directive Healthcare Power of Attorney Living will;Healthcare Power of [place] Living will;Healthcare Power of Attorney Living will;Healthcare Power of [place]  ?Does patient want to make changes to medical advance directive?   No - Patient declined No - Patient declined     ?Copy of [place]? No - copy requested No - copy requested No - copy requested No - copy requested     ?Would patient like information on creating a medical advance directive?      No - Patient declined   ? ? ?Current Medications (verified) ?Outpatient Encounter Medications as of 12/09/2021  ?Medication Sig  ? Calcium Carb-Cholecalciferol (OYSTER SHELL CALCIUM W/D) 500-200 MG-UNIT TABS TAKE 1 TABLET BY MOUTH 3 TIMES DAILY WITH MEALS.  ? Cholecalciferol (VITAMIN D3) 1.25 MG (50000 UT) CAPS Take 1 capsule by mouth daily.  ? ELIQUIS 2.5 MG TABS tablet TAKE 1 TABLET  BY MOUTH TWICE A DAY  ? erythromycin ophthalmic ointment Use a small amount on stitches 4 times a day for 14 days  ? lisinopril (ZESTRIL) 10 MG tablet Take 1 tablet by mouth daily.  ? losartan (COZAAR) 50 MG tablet TAKE 1 TABLET BY MOUTH EVERY DAY  ? Multiple Vitamins-Minerals (MULTIVITAMINS THER. W/MINERALS) TABS Take 1 tablet by mouth every morning.  ? omega-3 acid ethyl esters (LOVAZA) 1 g capsule Take 1 capsule by mouth 2 (two) times daily.  ? Oyster Shell Calcium 500 MG TABS TAKE 1 TABLET (500 MG TOTAL) BY MOUTH 3 (THREE) TIMES DAILY WITH MEALS.  ? pantoprazole (PROTONIX) 40 MG tablet Take 1 tablet (40 mg total) by mouth daily.  ? rosuvastatin (CRESTOR) 10 MG tablet TAKE 1 TABLET BY MOUTH EVERY DAY  ? Calcium 500 MG tablet Take 1 tablet (500 mg total) by mouth 3 (three) times daily with meals. (Patient not taking: Reported on 12/09/2021)  ? hydrochlorothiazide (HYDRODIURIL) 25 MG tablet hydrochlorothiazide 25 mg tablet ? TAKE 1 TABLET (25 MG TOTAL) BY MOUTH DAILY.  ? ?No facility-administered encounter medications on file as of 12/09/2021.  ? ? ?Allergies (verified) ?Patient has no known allergies.  ? ?History: ?Past Medical History:  ?Diagnosis Date  ? Arthritis   ? "maybe in my right knee and hip" (12/03/2015)  ? DVT (deep venous thrombosis) (HB and E 12/03/2015  ? LLE  ? Heart murmur   ? "when I was a  little girl; gone now"  ? High cholesterol   ? Hypertension   ? Osteoporosis   ? Prolapsed internal hemorrhoids, grade 3 07/04/2014  ? Pulmonary emboli ([place]) ~ 2012  ? Right arm weakness 10/30/2015  ? ?Past Surgical History:  ?Procedure Laterality Date  ? ABDOMINAL HYSTERECTOMY  ~ 1979  ? "partial"  ? CATARACT EXTRACTION W/ INTRAOCULAR LENS  IMPLANT, BILATERAL Bilateral 2017  ? DILATION AND CURETTAGE OF UTERUS    ? TONSILLECTOMY  1950  ? ?Family History  ?Problem Relation Age of Onset  ? Stroke Mother   ? Heart disease Mother   ? Hypertension Maternal Grandmother   ? ?Social History  ? ?Socioeconomic History  ? Marital  status: Married  ?  Spouse name: Not on file  ? Number of children: 1  ? Years of education: MA  ? Highest education level: Not on file  ?Occupational History  ? Occupation: retired  ?  Comment: middle school principle   ?Tobacco Use  ? Smoking status: Never  ? Smokeless tobacco: Never  ?Vaping Use  ? Vaping Use: Never used  ?Substance and Sexual Activity  ? Alcohol use: No  ? Drug use: No  ? Sexual activity: Never  ?Other Topics Concern  ? Not on file  ?Social History Narrative  ? Lives with husband-married 33 years   ?   ? 1 son- no grandkids   ? Right handed   ?   ? Enjoys: reads, cleaned and organized, piano   ?   ? Diet: eats all foods groups and tries to balanced diet well-occasionally hot dog and red meat  ? Caffeine: hot chocolate, green tea  ? Water: 3 liters  ?   ? Exercise 55 minutes 5 days of week   ?   ? Wears seat belt  ? Smoke detectors at home  ? Does not use phone while driving  ? ?Social Determinants of Health  ? ?Financial Resource Strain: Low Risk   ? Difficulty of Paying Living Expenses: Not hard at all  ?Food Insecurity: No Food Insecurity  ? Worried About Charity fundraiser in the Last Year: Never true  ? Ran Out of Food in the Last Year: Never true  ?Transportation Needs: No Transportation Needs  ? Lack of Transportation (Medical): No  ? Lack of Transportation (Non-Medical): No  ?Physical Activity: Sufficiently Active  ? Days of Exercise per Week: 4 days  ? Minutes of Exercise per Session: 40 min  ?Stress: No Stress Concern Present  ? Feeling of Stress : Not at all  ?Social Connections: Socially Integrated  ? Frequency of Communication with Friends and Family: More than three times a week  ? Frequency of Social Gatherings with Friends and Family: More than three times a week  ? Attends Religious Services: More than 4 times per year  ? Active Member of Clubs or Organizations: No  ? Attends Archivist Meetings: 1 to 4 times per year  ? Marital Status: Married  ? ? ?Tobacco  Counseling ?Counseling given: Not Answered ? ? ?Clinical Intake: ? ?  ? ?Pain : No/denies pain ? ?  ? ?Diabetes: No ? ?How often do you need to have someone help you when you read instructions, pamphlets, or other written materials from your doctor or pharmacy?: 1 - Never ? ?Diabetic?no ? ?  ? ?  ? ? ?Activities of Daily Living ? ?  12/09/2021  ?  9:07 AM 01/21/2021  ? 11:05 AM  ?In  your present state of health, do you have any difficulty performing the following activities:  ?Hearing? 0 0  ?Vision? 0 0  ?Difficulty concentrating or making decisions? 0 0  ?Walking or climbing stairs? 0 0  ?Dressing or bathing? 0 0  ?Doing errands, shopping? 0 0  ?Preparing Food and eating ? N   ?Using the Toilet? N   ?In the past six months, have you accidently leaked urine? N   ?Do you have problems with loss of bowel control? N   ?Managing your Medications? N   ?Managing your Finances? N   ?Housekeeping or managing your Housekeeping? N   ? ? ?Patient Care Team: ?Lindell Spar, MD as PCP - General (Internal Medicine) ?Brien Mates, RN as Oncology Nurse Navigator (Oncology) ?Kassie Mends, RN as [place] Management ? ?Indicate any recent Medical Services you may have received from other than [facility] providers in the past year (date may be approximate). ? ?   ?Assessment:  ? This is a routine wellness examination for [facility]. ? ?Hearing/Vision screen ?No results found. ? ?Dietary issues and exercise activities discussed: ?Current Exercise Habits: Home exercise routine, Type of exercise: treadmill, Time (Minutes): 45, Frequency (Times/Week): 5, Weekly Exercise (Minutes/Week): 225, Intensity: Moderate, Exercise limited by: orthopedic condition(s) ? ? Goals Addressed   ? ?  ?  ?  ?  ? This Visit's Progress  ?  Patient Stated     ?  Patient states that her goal is be healthy, live happy, and help people. ?  ? ?  ? ?Depression Screen ? ?  12/09/2021  ?  9:07 AM 10/04/2021  ?  2:13 PM 01/21/2021  ? 11:05 AM 01/15/2021  ?  12:55 PM 10/25/2020  ? 11:28 AM 06/26/2020  ?  1:07 PM 03/15/2020  ?  2:03 PM  ?PHQ 2/9 Scores  ?PHQ - 2 Score 0 0 0 0 0 0 0  ?PHQ- 9 Score     3  0  ?  ?Fall Risk ? ?  12/09/2021  ?  9:07 AM 10/04/2021  ?  2:13 PM 5

## 2022-01-03 ENCOUNTER — Telehealth: Payer: Self-pay | Admitting: *Deleted

## 2022-01-03 NOTE — Chronic Care Management (AMB) (Signed)
?  Care Management  ? ?Note ? ?01/03/2022 ?Name: Nicole Good MRN: [id number] DOB: 1941-05-30 ? ?Nicole Good is a 81 y.o. year old female who is a primary care patient of Lindell Spar, MD and is actively engaged with the care management team. I reached out to [name] by phone today to assist with re-scheduling a follow up visit with the RN Case Manager ? ?Follow up plan: ?Patient declines further follow up and engagement by the care management team. Appropriate care team members and provider have been notified via electronic communication.  The care management team is available to follow up with the patient after provider conversation with the patient regarding recommendation for care management engagement and subsequent re-referral to the care management team.  ? ?Laverda Sorenson  ?Care Guide, Embedded Care Coordination ?Reader  Care Management  ?Direct Dial: [phone] ? ?

## 2022-01-07 ENCOUNTER — Ambulatory Visit: Payer: Self-pay | Admitting: *Deleted

## 2022-01-07 NOTE — Chronic Care Management (AMB) (Signed)
? ?  01/07/2022 ? ?Nicole Good ?12/31/1940 ?[id number] ? ? ?Per note from care guide on 01/03/22, patient declines to reschedule and does not want any further follow up. Care plan updated/ resolved and case closed. ? ?Jacqlyn Larsen RNC, BSN ?RN Case Manager ?[facility] ?[phone] ? ?

## 2022-02-13 ENCOUNTER — Ambulatory Visit: Payer: Medicare Other | Admitting: Internal Medicine

## 2022-02-13 ENCOUNTER — Ambulatory Visit (INDEPENDENT_AMBULATORY_CARE_PROVIDER_SITE_OTHER): Payer: Medicare Other | Admitting: Nurse Practitioner

## 2022-02-13 ENCOUNTER — Encounter: Payer: Self-pay | Admitting: Nurse Practitioner

## 2022-02-13 VITALS — BP 142/92 | HR 67 | Ht 65.0 in | Wt 181.0 lb

## 2022-02-13 DIAGNOSIS — K5900 Constipation, unspecified: Secondary | ICD-10-CM | POA: Insufficient documentation

## 2022-02-13 DIAGNOSIS — I10 Essential (primary) hypertension: Secondary | ICD-10-CM

## 2022-02-13 DIAGNOSIS — E669 Obesity, unspecified: Secondary | ICD-10-CM

## 2022-02-13 DIAGNOSIS — I2699 Other pulmonary embolism without acute cor pulmonale: Secondary | ICD-10-CM | POA: Diagnosis not present

## 2022-02-13 DIAGNOSIS — N1832 Chronic kidney disease, stage 3b: Secondary | ICD-10-CM

## 2022-02-13 DIAGNOSIS — Z86718 Personal history of other venous thrombosis and embolism: Secondary | ICD-10-CM

## 2022-02-13 DIAGNOSIS — E7800 Pure hypercholesterolemia, unspecified: Secondary | ICD-10-CM

## 2022-02-13 MED ORDER — POLYETHYLENE GLYCOL 3350 17 GM/SCOOP PO POWD: 17.0000 g | Freq: Every day | ORAL | 1 refills | Status: DC | PRN

## 2022-02-13 NOTE — Assessment & Plan Note (Signed)
Chronic stable condition followed by nephrology Recent creatinine clearance-1.65, EGFR 31 Avoid nephrotoxic agents On losartan 50 mg daily Patient encouraged to maintain close follow-up with nephrology

## 2022-02-13 NOTE — Patient Instructions (Signed)
Please take miralax 17g once daily as needed for constipation.   For constipation it is important that you have an adequate intake of fruit and vegetables daily, at least 3 servings of each, as well as water intake of at least 48 ounces daily and regular exercise.  OTC stool softeners are helpful for daily use, up to 4 daily (eg. Colace)  Fiber intake daily is needed, in the form of Bran or Shredded Wheat   It is important that you exercise regularly at least 30 minutes 5 times a week.  Think about what you will eat, plan ahead. Choose " clean, green, fresh or frozen" over canned, processed or packaged foods which are more sugary, salty and fatty. 70 to 75% of food eaten should be vegetables and fruit. Three meals at set times with snacks allowed between meals, but they must be fruit or vegetables. Aim to eat over a 12 hour period , example 7 am to 7 pm, and STOP after  your last meal of the day. Drink water,generally about 64 ounces per day, no other drink is as healthy. Fruit juice is best enjoyed in a healthy way, by EATING the fruit.  Thanks for choosing [facility], we consider it a privelige to serve you.

## 2022-02-13 NOTE — Assessment & Plan Note (Signed)
BP Readings from Last 3 Encounters:  02/13/22 (!) 142/92  10/04/21 101/71  02/12/21 (!) 148/105  Currently on hydrochlorothiazide 25 mg daily, losartan 50 mg daily States that she has not taking her blood pressure medications today Continue current medications DASH diet advised engage in regular daily exercises at least for 50 minutes weekly Appreciate collaboration with nephrology

## 2022-02-13 NOTE — Assessment & Plan Note (Addendum)
Wt Readings from Last 3 Encounters:  02/13/22 181 lb (82.1 kg)  10/04/21 183 lb (83 kg)  02/12/21 182 lb 3.2 oz (82.6 kg)   Patient states that she exercises on exercise machine -[employer] , does 2.5mles five days a week. Patient counseled on low-carb diet encouraged to continue to exercise daily as tolerated.  Importance of portion control also discussed

## 2022-02-13 NOTE — Progress Notes (Signed)
   Nicole Good     MRN: [id number]      DOB: [birth year]   HPI Nicole Good with past medical history of deep venous thrombosis, essential hypertension, stage IIIb CKD, hypercholesterol anemia is here for follow up and re-evaluation of chronic medical conditions, medication management and review of any available recent lab     HTN .  Currently taking losartan 50 mg daily, hydrochlorothiazide 25 mg daily patient stated that she has been taking both medications as prescribed however she has not taken her blood pressure meds today, patient denies chest pain, edema, dizziness, shortness of breath.   Obesity .patient states that she exercises on exercise machine -[employer] , does 2.77mles five days a week.   States that she has gotten her TDAP vaccines and shingles vaccine at the pharmacy.    chronic constipation. Takes colace OTC as needed, patient denies abdominal pain, nausea, vomiting, bloody stool   ROS Denies recent fever or chills. Denies sinus pressure, nasal congestion, ear pain or sore throat. Denies chest congestion, productive cough or wheezing. Denies chest pains, and leg swelling Denies abdominal pain, nausea, vomiting,diarrhea  Denies dysuria, frequency, hesitancy or incontinence. Denies joint pain, swelling and limitation in mobility. Denies headaches, seizures, numbness, or tingling. Denies depression, anxiety or insomnia.    PE  BP (!) 142/92 (BP Location: Right Arm, Cuff Size: Normal)   Pulse 67   Ht 5' 5" (1.651 m)   Wt 181 lb (82.1 kg)   SpO2 96%   BMI 30.12 kg/m   Patient alert and oriented and in no cardiopulmonary distress.  Chest: Clear to auscultation bilaterally.  CVS: S1, S2 no murmurs, no S3.Regular rate.  ABD: Soft non tender.   Ext: No edema  MS: Adequate ROM spine, shoulders, hips and knees.  Psych: Good eye contact, normal affect. Memory intact not anxious or depressed appearing.    Assessment & Plan Other pulmonary embolism  without acute cor pulmonale (HCC) On Eliquis 2.5 mg twice daily Continue current medication  Stage 3b chronic kidney disease ([place] Chronic stable condition followed by nephrology Recent creatinine clearance-1.65, EGFR 31 Avoid nephrotoxic agents On losartan 50 mg daily Patient encouraged to maintain close follow-up with nephrology  Hypercholesterolemia Rosuvastatin 10 mg daily Check lipid panel Avoid fried fatty foods  H/O deep venous thrombosis Continue Eliquis 2.5 mg twice daily  Constipation Chronic condition currently taking OTC Colace Start MiraLAX 17 g once daily as needed Patient encouraged to increase intake of fibers  drink  64 ounces of water daily  Essential hypertension BP Readings from Last 3 Encounters:  02/13/22 (!) 142/92  10/04/21 101/71  02/12/21 (!) 148/105  Currently on hydrochlorothiazide 25 mg daily, losartan 50 mg daily States that she has not taking her blood pressure medications today Continue current medications DASH diet advised engage in regular daily exercises at least for 50 minutes weekly Appreciate collaboration with nephrology  Obesity (BMI 30-39.9) Wt Readings from Last 3 Encounters:  02/13/22 181 lb (82.1 kg)  10/04/21 183 lb (83 kg)  02/12/21 182 lb 3.2 oz (82.6 kg)   Patient states that she exercises on exercise machine -[employer], does 2.532mes five days a week. Patient counseled on low-carb diet encouraged to continue to exercise daily as tolerated.  Importance of portion control also discussed

## 2022-02-13 NOTE — Assessment & Plan Note (Signed)
Continue Eliquis 2.5 mg twice daily.  

## 2022-02-13 NOTE — Assessment & Plan Note (Signed)
Chronic condition currently taking OTC Colace Start MiraLAX 17 g once daily as needed Patient encouraged to increase intake of fibers  drink  64 ounces of water daily

## 2022-02-13 NOTE — Assessment & Plan Note (Signed)
Rosuvastatin 10 mg daily Check lipid panel Avoid fried fatty foods

## 2022-02-13 NOTE — Assessment & Plan Note (Signed)
On Eliquis 2.5 mg twice daily Continue current medication

## 2022-02-15 LAB — LIPID PANEL
Chol/HDL Ratio: 4.1 ratio (ref 0.0–4.4)
Cholesterol, Total: 156 mg/dL (ref 100–199)
HDL: 38 mg/dL — ABNORMAL LOW (ref >39)
LDL Chol Calc (NIH): 93 mg/dL (ref 0–99)
Triglycerides: 142 mg/dL (ref 0–149)
VLDL Cholesterol Cal: 25 mg/dL (ref 5–40)

## 2022-02-16 NOTE — Progress Notes (Signed)
LDL is stable , continue current medication

## 2022-02-20 ENCOUNTER — Other Ambulatory Visit: Payer: Self-pay | Admitting: Family Medicine

## 2022-02-20 DIAGNOSIS — Z7901 Long term (current) use of anticoagulants: Secondary | ICD-10-CM

## 2022-02-20 DIAGNOSIS — Z86718 Personal history of other venous thrombosis and embolism: Secondary | ICD-10-CM

## 2022-02-21 ENCOUNTER — Encounter: Payer: Self-pay | Admitting: Nurse Practitioner

## 2022-02-21 ENCOUNTER — Other Ambulatory Visit: Payer: Self-pay | Admitting: Family Medicine

## 2022-02-23 ENCOUNTER — Other Ambulatory Visit: Payer: Self-pay | Admitting: Internal Medicine

## 2022-02-27 ENCOUNTER — Telehealth: Payer: Self-pay | Admitting: Nurse Practitioner

## 2022-02-27 NOTE — Telephone Encounter (Signed)
Patient LVM for lab results

## 2022-02-28 ENCOUNTER — Telehealth: Payer: Self-pay | Admitting: Nurse Practitioner

## 2022-02-28 NOTE — Telephone Encounter (Signed)
Spoke with pt advised of results pt verbalized understanding °

## 2022-02-28 NOTE — Telephone Encounter (Signed)
Spoke with pt

## 2022-03-06 ENCOUNTER — Other Ambulatory Visit: Payer: Self-pay | Admitting: *Deleted

## 2022-03-06 DIAGNOSIS — I10 Essential (primary) hypertension: Secondary | ICD-10-CM

## 2022-04-12 ENCOUNTER — Other Ambulatory Visit: Payer: Self-pay | Admitting: Family Medicine

## 2022-04-12 ENCOUNTER — Other Ambulatory Visit: Payer: Self-pay | Admitting: Internal Medicine

## 2022-04-12 DIAGNOSIS — I10 Essential (primary) hypertension: Secondary | ICD-10-CM

## 2022-05-19 ENCOUNTER — Other Ambulatory Visit: Payer: Self-pay

## 2022-05-19 DIAGNOSIS — D472 Monoclonal gammopathy: Secondary | ICD-10-CM

## 2022-05-20 ENCOUNTER — Inpatient Hospital Stay: Payer: Medicare Other | Attending: Hematology

## 2022-05-27 ENCOUNTER — Inpatient Hospital Stay: Payer: Medicare Other | Admitting: Physician Assistant

## 2022-06-19 ENCOUNTER — Telehealth: Payer: Self-pay | Admitting: Internal Medicine

## 2022-06-19 NOTE — Telephone Encounter (Signed)
Returned pt call  

## 2022-06-19 NOTE — Telephone Encounter (Signed)
Previous [facility] patient   Pt called wanting to know if she can get the flu shot & covid booster. She is unsure if she is due for the booster??

## 2022-06-23 ENCOUNTER — Other Ambulatory Visit: Payer: Self-pay | Admitting: Family Medicine

## 2022-06-23 DIAGNOSIS — Z7901 Long term (current) use of anticoagulants: Secondary | ICD-10-CM

## 2022-06-23 DIAGNOSIS — Z86718 Personal history of other venous thrombosis and embolism: Secondary | ICD-10-CM

## 2022-07-05 ENCOUNTER — Other Ambulatory Visit: Payer: Self-pay | Admitting: Internal Medicine

## 2022-07-24 ENCOUNTER — Telehealth: Payer: Self-pay | Admitting: Internal Medicine

## 2022-07-24 ENCOUNTER — Other Ambulatory Visit: Payer: Self-pay

## 2022-07-24 MED ORDER — OYSTER SHELL CALCIUM/D3 500-5 MG-MCG PO TABS: 1.0000 | ORAL_TABLET | Freq: Three times a day (TID) | ORAL | 0 refills | Status: DC

## 2022-07-24 NOTE — Telephone Encounter (Signed)
  Prescription Request  07/24/2022  Is this a "Controlled Substance" medicine? No  LOV: 07/05/2022   What is the name of the medication or equipment?   Calcium Carb-Cholecalciferol (OYSTER SHELL CALCIUM W/D) 500-200 MG-UNIT TABS   Have you contacted your pharmacy to request a refill? Yes   Which pharmacy would you like this sent to?  [id number] [id number]: [phone]: [phone]  Patient notified that their request is being sent to the clinical staff for review and that they should receive a response within 2 business days.   Please advise at [phone](mobile)

## 2022-07-25 NOTE — Telephone Encounter (Signed)
Per chart when checked at 8:37 am medication was refilled 07/24/22. No documentation sent back to med refill request. Encounter closed

## 2022-07-27 ENCOUNTER — Emergency Department (HOSPITAL_COMMUNITY)
Admission: EM | Admit: 2022-07-27 | Discharge: 2022-07-27 | Disposition: A | Discharge status: Home / Self Care | Payer: Medicare Other | Attending: Emergency Medicine | Admitting: Emergency Medicine

## 2022-07-27 ENCOUNTER — Encounter (HOSPITAL_COMMUNITY): Payer: Self-pay

## 2022-07-27 ENCOUNTER — Other Ambulatory Visit: Payer: Self-pay

## 2022-07-27 DIAGNOSIS — Z7901 Long term (current) use of anticoagulants: Secondary | ICD-10-CM | POA: Diagnosis not present

## 2022-07-27 DIAGNOSIS — I10 Essential (primary) hypertension: Secondary | ICD-10-CM | POA: Insufficient documentation

## 2022-07-27 DIAGNOSIS — Z1152 Encounter for screening for COVID-19: Secondary | ICD-10-CM | POA: Insufficient documentation

## 2022-07-27 DIAGNOSIS — R5383 Other fatigue: Secondary | ICD-10-CM | POA: Diagnosis present

## 2022-07-27 DIAGNOSIS — E860 Dehydration: Secondary | ICD-10-CM | POA: Diagnosis not present

## 2022-07-27 DIAGNOSIS — N3000 Acute cystitis without hematuria: Secondary | ICD-10-CM | POA: Insufficient documentation

## 2022-07-27 DIAGNOSIS — D72829 Elevated white blood cell count, unspecified: Secondary | ICD-10-CM | POA: Diagnosis not present

## 2022-07-27 DIAGNOSIS — Z79899 Other long term (current) drug therapy: Secondary | ICD-10-CM | POA: Insufficient documentation

## 2022-07-27 LAB — URINALYSIS, ROUTINE W REFLEX MICROSCOPIC
Bilirubin Urine: NEGATIVE
Glucose, UA: NEGATIVE mg/dL
Hgb urine dipstick: NEGATIVE
Ketones, ur: NEGATIVE mg/dL
Nitrite: POSITIVE — AB
Protein, ur: NEGATIVE mg/dL
Specific Gravity, Urine: 1.017 (ref 1.005–1.030)
pH: 5 (ref 5.0–8.0)

## 2022-07-27 LAB — BASIC METABOLIC PANEL
Anion gap: 8 (ref 5–15)
BUN: 28 mg/dL — ABNORMAL HIGH (ref 8–23)
CO2: 26 mmol/L (ref 22–32)
Calcium: 9.5 mg/dL (ref 8.9–10.3)
Chloride: 105 mmol/L (ref 98–111)
Creatinine, Ser: 1.63 mg/dL — ABNORMAL HIGH (ref 0.44–1.00)
GFR, Estimated: 31 mL/min — ABNORMAL LOW (ref >60)
Glucose, Bld: 98 mg/dL (ref 70–99)
Potassium: 4.1 mmol/L (ref 3.5–5.1)
Sodium: 139 mmol/L (ref 135–145)

## 2022-07-27 LAB — RESP PANEL BY RT-PCR (FLU A&B, COVID) ARPGX2
Influenza A by PCR: NEGATIVE
Influenza B by PCR: NEGATIVE
SARS Coronavirus 2 by RT PCR: NEGATIVE

## 2022-07-27 LAB — CBC
HCT: 40.4 % (ref 36.0–46.0)
Hemoglobin: 13 g/dL (ref 12.0–15.0)
MCH: 29.8 pg (ref 26.0–34.0)
MCHC: 32.2 g/dL (ref 30.0–36.0)
MCV: 92.7 fL (ref 80.0–100.0)
Platelets: 210 10*3/uL (ref 150–400)
RBC: 4.36 MIL/uL (ref 3.87–5.11)
RDW: 13 % (ref 11.5–15.5)
WBC: 6.1 10*3/uL (ref 4.0–10.5)
nRBC: 0 % (ref 0.0–0.2)

## 2022-07-27 LAB — CBG MONITORING, ED: Glucose-Capillary: 109 mg/dL — ABNORMAL HIGH (ref 70–99)

## 2022-07-27 MED ORDER — SODIUM CHLORIDE 0.9 % IV SOLN
1.0000 g | Freq: Once | INTRAVENOUS | Status: AC
  Administered 2022-07-27: 1 g via INTRAVENOUS
  Filled 2022-07-27: qty 10

## 2022-07-27 MED ORDER — CEPHALEXIN 500 MG PO CAPS: 500.0000 mg | ORAL_CAPSULE | Freq: Four times a day (QID) | ORAL | 0 refills | Status: DC

## 2022-07-27 MED ORDER — SODIUM CHLORIDE 0.9 % IV BOLUS
1000.0000 mL | Freq: Once | INTRAVENOUS | Status: AC
  Administered 2022-07-27: 1000 mL via INTRAVENOUS

## 2022-07-27 NOTE — ED Provider Notes (Signed)
[facility] EMERGENCY DEPARTMENT Provider Note   CSN: [id number] Arrival date & time: 07/27/22  1011     History  Chief Complaint  Patient presents with   Fatigue         Nicole Good is a 81 y.o. female.  The history is provided by the patient and medical records. No language interpreter was used.     81 year old female significant history of PE, DVT, on Eliquis, hypertension, hypercholesterolemia presents today with complaints of generalized fatigue.  Patient report for the past 4 days she feels increasingly more fatigue, decrease in appetite, not eating quite as much, and overall not feeling well.  She feels like she may have lost a few pounds within the same timeframe.  She does not endorse any fever chills no headache no lightheadedness or dizziness no runny nose sneezing coughing no chest pain shortness of breath no abdominal pain no trouble urinating no change in bowel movement no focal numbness or focal weakness.  No recent medication changes and she has been compliant with her medication.  No recent sick contact.  No specific treatment tried at home.   Home Medications Prior to Admission medications   Medication Sig Start Date End Date Taking? Authorizing Provider  Calcium 500 MG tablet Take 1 tablet (500 mg total) by mouth 3 (three) times daily with meals. Patient not taking: Reported on 12/09/2021 02/15/20   Perlie Mayo, NP  Calcium Carb-Cholecalciferol (OYSTER SHELL CALCIUM W/D) 500-200 MG-UNIT TABS TAKE 1 TABLET BY MOUTH 3 TIMES DAILY WITH MEALS. 06/17/21   Noreene Larsson, NP  calcium-vitamin D (OSCAL WITH D) 500-5 MG-MCG tablet Take 1 tablet by mouth 3 (three) times daily. 07/24/22   Johnette Abraham, MD  Cholecalciferol (VITAMIN D3) 1.25 MG (50000 UT) CAPS Take 1 capsule by mouth daily.    [provider]  ELIQUIS 2.5 MG TABS tablet TAKE 1 TABLET BY MOUTH TWICE A DAY 06/23/22   Johnette Abraham, MD  erythromycin ophthalmic ointment Use a small amount on  stitches 4 times a day for 14 days Patient not taking: Reported on 02/13/2022 11/26/21   [provider]  hydrochlorothiazide (HYDRODIURIL) 25 MG tablet TAKE 1 TABLET (25 MG TOTAL) BY MOUTH DAILY. 02/21/22   Renee Rival, FNP  losartan (COZAAR) 50 MG tablet TAKE 1 TABLET BY MOUTH EVERY DAY 04/14/22   Fayrene Helper, MD  Multiple Vitamins-Minerals (MULTIVITAMINS THER. W/MINERALS) TABS Take 1 tablet by mouth every morning.    [provider]  omega-3 acid ethyl esters (LOVAZA) 1 g capsule Take 1 capsule by mouth 2 (two) times daily. Patient not taking: Reported on 02/13/2022 11/30/19   [provider]  pantoprazole (PROTONIX) 40 MG tablet TAKE 1 TABLET BY MOUTH EVERY DAY 04/14/22   Lindell Spar, MD  polyethylene glycol powder (GLYCOLAX/MIRALAX) 17 GM/SCOOP powder Take 17 g by mouth daily as needed. 02/13/22   Renee Rival, FNP  rosuvastatin (CRESTOR) 10 MG tablet TAKE 1 TABLET BY MOUTH EVERY DAY 07/07/22   Lindell Spar, MD      Allergies    Patient has no known allergies.    Review of Systems   Review of Systems  All other systems reviewed and are negative.   Physical Exam Updated Vital Signs BP (!) 113/91 (BP Location: Left Arm)   Pulse 60   Temp 98.7 F (37.1 C) (Oral)   Resp 16   Ht '5\' 6"'$  (1.676 m)   Wt 80.3 kg  SpO2 100%   BMI 28.57 kg/m  Physical Exam Vitals and nursing note reviewed.  Constitutional:      General: She is not in acute distress.    Appearance: She is well-developed.  HENT:     Head: Atraumatic.     Nose: Nose normal.     Mouth/Throat:     Mouth: Mucous membranes are moist.  Eyes:     Conjunctiva/sclera: Conjunctivae normal.  Cardiovascular:     Rate and Rhythm: Normal rate and regular rhythm.     Pulses: Normal pulses.     Heart sounds: Normal heart sounds.  Pulmonary:     Effort: Pulmonary effort is normal.     Breath sounds: No wheezing, rhonchi or rales.  Abdominal:     Palpations: Abdomen is soft.      Tenderness: There is no abdominal tenderness.  Musculoskeletal:     Cervical back: Neck supple.     Comments: 5 out of 5 strength all 4 extremities  Skin:    Findings: No rash.  Neurological:     Mental Status: She is alert and oriented to person, place, and time.  Psychiatric:        Mood and Affect: Mood normal.     ED Results / Procedures / Treatments   Labs (all labs ordered are listed, but only abnormal results are displayed) Labs Reviewed  BASIC METABOLIC PANEL - Abnormal; Notable for the following components:      Result Value   BUN 28 (*)    Creatinine, Ser 1.63 (*)    GFR, Estimated 31 (*)    All other components within normal limits  URINALYSIS, ROUTINE W REFLEX MICROSCOPIC - Abnormal; Notable for the following components:   APPearance CLOUDY (*)    Nitrite POSITIVE (*)    Leukocytes,Ua LARGE (*)    Bacteria, UA MANY (*)    All other components within normal limits  CBG MONITORING, ED - Abnormal; Notable for the following components:   Glucose-Capillary 109 (*)    All other components within normal limits  RESP PANEL BY RT-PCR (FLU A&B, COVID) ARPGX2  URINE CULTURE  CBC    EKG None  Radiology No results found.  Procedures Procedures    Medications Ordered in ED Medications  cefTRIAXone (ROCEPHIN) 1 g in sodium chloride 0.9 % 100 mL IVPB (0 g Intravenous Stopped 07/27/22 1415)  sodium chloride 0.9 % bolus 1,000 mL (0 mLs Intravenous Stopped 07/27/22 1455)    ED Course/ Medical Decision Making/ A&P                           Medical Decision Making Amount and/or Complexity of Data Reviewed Labs: ordered.   BP (!) 113/91 (BP Location: Left Arm)   Pulse 60   Temp 98.7 F (37.1 C) (Oral)   Resp 16   Ht '5\' 6"'$  (1.676 m)   Wt 80.3 kg   SpO2 100%   BMI 28.57 kg/m   20:82 PM  81 year old female significant history of PE, DVT, on Eliquis, hypertension, hypercholesterolemia presents today with complaints of generalized fatigue.  Patient report for  the past 4 days she feels increasingly more fatigue, decrease in appetite, not eating quite as much, and overall not feeling well.  She feels like she may have lost a few pounds within the same timeframe.  She does not endorse any fever chills no headache no lightheadedness or dizziness no runny nose sneezing coughing no chest pain  shortness of breath no abdominal pain no trouble urinating no change in bowel movement no focal numbness or focal weakness.  No recent medication changes and she has been compliant with her medication.  No recent sick contact.  No specific treatment tried at home.  On exam this is a well-appearing elderly female laying in bed appears to be in no acute discomfort.  She is alert and oriented x4.  She has no focal neurodeficit.  Heart lung sounds normal abdomen is soft nontender.  Vital signs overall reassuring, no hypoxia, she is afebrile  -Labs ordered, independently viewed and interpreted by me.  Labs remarkable for UA showing positive nitrite, large leukocytes and many bacterial concerning for UTI.  Cr 1.63 concerning for AKI. The remainder of the labs are reassuring.  -The patient was maintained on a cardiac monitor.  I personally viewed and interpreted the cardiac monitored which showed an underlying rhythm of: NSR -This patient presents to the ED for concern of fatigue, this involves an extensive number of treatment options, and is a complaint that carries with it a high risk of complications and morbidity.  The differential diagnosis includes UTI, viral illness, anemia, electrolytes imbalance, stroke, MI -Co morbidities that complicate the patient evaluation includes HTN, PE, HLD -Treatment includes rocephin, IVF -Reevaluation of the patient after these medicines showed that the patient improved -PCP office notes or outside notes reviewed -Discussion with attending Dr. Rogene Houston -Escalation to admission/observation considered: patients feels much better, is comfortable  with discharge, and will follow up with PCP -Prescription medication considered, patient comfortable with keflex -Social Determinant of Health considered          Final Clinical Impression(s) / ED Diagnoses Final diagnoses:  Acute cystitis without hematuria  Dehydration    Rx / DC Orders ED Discharge Orders          Ordered    cephALEXin (KEFLEX) 500 MG capsule  4 times daily        07/27/22 1521              Domenic Moras, PA-C 07/27/22 1524    Fredia Sorrow, MD 07/30/22 [phone]

## 2022-07-27 NOTE — Discharge Instructions (Signed)
You have been evaluated for your symptoms.  Your urinalysis indicates that you have a urinary tract infection.  Please take antibiotic as prescribed.  You also were noted to be dehydrated.  Drink plenty of fluid and follow-up with your doctor for further care.  Return if you have any concern.

## 2022-07-27 NOTE — ED Triage Notes (Signed)
Pt presents with a c/o of generalized malaise x 4 days. Pt states she has been sleeping more as well. Pt denies N/V/D, cough, fever.

## 2022-07-29 LAB — URINE CULTURE: Culture: >=100000 — AB

## 2022-07-30 ENCOUNTER — Telehealth (HOSPITAL_BASED_OUTPATIENT_CLINIC_OR_DEPARTMENT_OTHER): Payer: Self-pay

## 2022-07-30 NOTE — Telephone Encounter (Signed)
Post ED Visit - Positive Culture Follow-up  Culture report reviewed by antimicrobial stewardship pharmacist: [place] Team '[x]'$  Bertis Ruddy, Pharm.D. '[]'$  Heide Guile, Pharm.D., BCPS AQ-ID '[]'$  Parks Neptune, Pharm.D., BCPS '[]'$  Alycia Rossetti, Pharm.D., BCPS '[]'$  [employer], Pharm.D., BCPS, AAHIVP '[]'$  Legrand Como, Pharm.D., BCPS, AAHIVP '[]'$  Salome Arnt, PharmD, BCPS '[]'$  Johnnette Gourd, PharmD, BCPS '[]'$  Hughes Better, PharmD, BCPS '[]'$  Leeroy Cha, PharmD '[]'$  Laqueta Linden, PharmD, BCPS '[]'$  Albertina Parr, PharmD  [place] Team '[]'$  Leodis Sias, PharmD '[]'$  Lindell Spar, PharmD '[]'$  Royetta Asal, PharmD '[]'$  Graylin Shiver, Rph '[]'$  Rema Fendt) Glennon Mac, PharmD '[]'$  Arlyn Dunning, PharmD '[]'$  Netta Cedars, PharmD '[]'$  Dia Sitter, PharmD '[]'$  Leone Haven, PharmD '[]'$  Gretta Arab, PharmD '[]'$  Theodis Shove, PharmD '[]'$  Peggyann Juba, PharmD '[]'$  Reuel Boom, PharmD   Positive urine culture Treated with Cephalexin, organism sensitive to the same and no further patient follow-up is required at this time.  Glennon Hamilton 07/30/2022, 9:12 AM

## 2022-08-05 ENCOUNTER — Telehealth: Payer: Self-pay | Admitting: *Deleted

## 2022-08-05 NOTE — Telephone Encounter (Signed)
     Patient  visit on 07/27/2022  at [facility]  was for UTI  Have you been able to follow up with your primary care physician? she sees PCP on 08/15/2022 and has her medications and transportation to the appt  The patient was able to obtain any needed medicine or equipment.  Are there diet recommendations that you are having difficulty following?  Patient expresses understanding of discharge instructions and education provided has no other needs at this time.    [facility] [phone] [address] , [place] [address] Email : [email or web address]

## 2022-08-15 ENCOUNTER — Ambulatory Visit: Payer: Medicare Other | Admitting: Nurse Practitioner

## 2022-08-15 ENCOUNTER — Encounter: Payer: Self-pay | Admitting: Internal Medicine

## 2022-08-15 ENCOUNTER — Ambulatory Visit (INDEPENDENT_AMBULATORY_CARE_PROVIDER_SITE_OTHER): Payer: Medicare Other | Admitting: Internal Medicine

## 2022-08-15 VITALS — BP 140/88 | HR 90 | Ht 65.5 in | Wt 180.0 lb

## 2022-08-15 DIAGNOSIS — Z0001 Encounter for general adult medical examination with abnormal findings: Secondary | ICD-10-CM

## 2022-08-15 DIAGNOSIS — N1832 Chronic kidney disease, stage 3b: Secondary | ICD-10-CM | POA: Diagnosis not present

## 2022-08-15 DIAGNOSIS — R7303 Prediabetes: Secondary | ICD-10-CM

## 2022-08-15 DIAGNOSIS — R5383 Other fatigue: Secondary | ICD-10-CM | POA: Diagnosis not present

## 2022-08-15 DIAGNOSIS — I824Y9 Acute embolism and thrombosis of unspecified deep veins of unspecified proximal lower extremity: Secondary | ICD-10-CM

## 2022-08-15 DIAGNOSIS — E782 Mixed hyperlipidemia: Secondary | ICD-10-CM

## 2022-08-15 DIAGNOSIS — I10 Essential (primary) hypertension: Secondary | ICD-10-CM | POA: Diagnosis not present

## 2022-08-15 NOTE — Progress Notes (Unsigned)
Established Patient Office Visit  Subjective   Patient ID: Nicole Good, female    DOB: 21-Aug-1941  Age: 81 y.o. MRN: [id number]  Chief Complaint  Patient presents with   Chronic Kidney Disease    Follow up   Nicole Good returns to care today.  Nicole Good was last seen at [facility] on 02/13/2022 for routine follow-up.  There have been no acute interval events.  Nicole Good presented to the emergency department on 11/19 endorsing fatigue and prescribed Keflex for treatment of UTI.  Today Nicole Good continues to endorse fatigue, noting that Nicole Good does not feel like herself.  Nicole Good would like for labs to be repeated to ensure there are no underlying causes.  Nicole Good relates that Nicole Good has not had the energy to exercise like Nicole Good typically does.  Acute concerns, chronic medical conditions, and outstanding preventative care items discussed today are individually addressed in A/P below.  Past Medical History:  Diagnosis Date   Arthritis    "maybe in my right knee and hip" (12/03/2015)   DVT (deep venous thrombosis) ([place]) 12/03/2015   LLE   Heart murmur    "when I was a little girl; gone now"   High cholesterol    Hypertension    Osteoporosis    Prolapsed internal hemorrhoids, grade 3 07/04/2014   Pulmonary emboli ([place]) ~ 2012   Right arm weakness 10/30/2015   Past Surgical History:  Procedure Laterality Date   ABDOMINAL HYSTERECTOMY  ~ 1979   "partial"   CATARACT EXTRACTION W/ INTRAOCULAR LENS  IMPLANT, BILATERAL Bilateral 2017   DILATION AND CURETTAGE OF UTERUS     TONSILLECTOMY  1950   Social History   Tobacco Use   Smoking status: Never   Smokeless tobacco: Never  Vaping Use   Vaping Use: Never used  Substance Use Topics   Alcohol use: No   Drug use: No   Family History  Problem Relation Age of Onset   Stroke Mother    Heart disease Mother    Hypertension Maternal Grandmother    No Known Allergies  Review of Systems  Constitutional:  Positive for malaise/fatigue.  All other systems reviewed and  are negative.     Objective:     BP (!) 140/88   Pulse 90   Ht 5' 5.5" (1.664 m)   Wt 180 lb (81.6 kg)   SpO2 94%   BMI 29.50 kg/m  BP Readings from Last 3 Encounters:  08/15/22 (!) 140/88  07/27/22 (!) 127/91  02/13/22 (!) 142/92      Physical Exam Vitals reviewed.  Constitutional:      General: Nicole Good is not in acute distress.    Appearance: Normal appearance. Nicole Good is not toxic-appearing.  HENT:     Head: Normocephalic and atraumatic.     Right Ear: External ear normal.     Left Ear: External ear normal.     Nose: Nose normal. No congestion or rhinorrhea.     Mouth/Throat:     Mouth: Mucous membranes are moist.     Pharynx: Oropharynx is clear. No oropharyngeal exudate or posterior oropharyngeal erythema.  Eyes:     General: No scleral icterus.    Extraocular Movements: Extraocular movements intact.     Conjunctiva/sclera: Conjunctivae normal.     Pupils: Pupils are equal, round, and reactive to light.  Cardiovascular:     Rate and Rhythm: Normal rate and regular rhythm.     Pulses: Normal pulses.     Heart sounds: Normal heart  sounds. No murmur heard.    No friction rub. No gallop.  Pulmonary:     Effort: Pulmonary effort is normal.     Breath sounds: Normal breath sounds. No wheezing, rhonchi or rales.  Abdominal:     General: Abdomen is flat. Bowel sounds are normal. There is no distension.     Palpations: Abdomen is soft.     Tenderness: There is no abdominal tenderness.  Musculoskeletal:        General: No swelling. Normal range of motion.     Cervical back: Normal range of motion.     Right lower leg: No edema.     Left lower leg: No edema.  Lymphadenopathy:     Cervical: No cervical adenopathy.  Skin:    General: Skin is warm and dry.     Capillary Refill: Capillary refill takes less than 2 seconds.     Coloration: Skin is not jaundiced.  Neurological:     General: No focal deficit present.     Mental Status: Nicole Good is alert and oriented to person,  place, and time.  Psychiatric:        Mood and Affect: Mood normal.        Behavior: Behavior normal.    Last CBC Lab Results  Component Value Date   WBC 6.1 07/27/2022   HGB 13.0 07/27/2022   HCT 40.4 07/27/2022   MCV 92.7 07/27/2022   MCH 29.8 07/27/2022   RDW 13.0 07/27/2022   PLT 210 86/57/8469   Last metabolic panel Lab Results  Component Value Date   GLUCOSE 91 08/15/2022   NA 140 08/15/2022   K 4.4 08/15/2022   CL 103 08/15/2022   CO2 22 08/15/2022   BUN 26 08/15/2022   CREATININE 1.55 (H) 08/15/2022   GFRNONAA 31 (L) 07/27/2022   CALCIUM 10.1 08/15/2022   PHOS 2.6 11/09/2020   PROT 6.8 10/07/2021   ALBUMIN 4.4 10/07/2021   LABGLOB 2.4 10/07/2021   AGRATIO 1.8 10/07/2021   BILITOT 0.3 10/07/2021   ALKPHOS 70 10/07/2021   AST 23 10/07/2021   ALT 21 10/07/2021   ANIONGAP 8 07/27/2022   Last lipids Lab Results  Component Value Date   CHOL 156 02/14/2022   HDL 38 (L) 02/14/2022   LDLCALC 93 02/14/2022   TRIG 142 02/14/2022   CHOLHDL 4.1 02/14/2022   Last hemoglobin A1c Lab Results  Component Value Date   HGBA1C 6.1 (H) 10/07/2021   Last thyroid functions Lab Results  Component Value Date   TSH 0.512 08/15/2022   Last vitamin D Lab Results  Component Value Date   VD25OH 61.1 08/15/2022     Assessment & Plan:   Problem List Items Addressed This Visit       Deep venous thrombosis ([place])    Nicole Good remains on Eliquis 2.5 mg twice daily.  No medication changes today.      Essential hypertension    BP mildly elevated today, 143/96.  Nicole Good is currently prescribed HCTZ 25 mg daily and losartan 50 mg daily.  Nicole Good regularly checks her blood pressure at home and reports readings that are well within goal.  No medication changes today.  We will follow-up in 1 month for BP check.      Stage 3b chronic kidney disease ([place])    Followed by nephrology (Dr. Theador Hawthorne). Last seen in August. Nicole Good has follow up scheduled for January.       Fatigue    This appears  to be a chronic  issue.  Nicole Good reports today that Nicole Good does not feel like herself.  Nicole Good does not have the motivation to exercise like Nicole Good previously has had. -Repeat labs ordered today to exclude underlying metabolic etiologies -Follow up in 1 month for reassessment       Return in about 4 weeks (around 09/12/2022) for fatigue.    Johnette Abraham, MD

## 2022-08-15 NOTE — Patient Instructions (Signed)
It was a pleasure to see you today.  Thank you for giving [place] the opportunity to be involved in your care.  Below is a brief recap of your visit and next steps.  We will plan to see you again in 1 month.  Summary No medication changes today We will follow up in 1 month to review your symptoms

## 2022-08-16 LAB — BASIC METABOLIC PANEL
BUN/Creatinine Ratio: 17 (ref 12–28)
BUN: 26 mg/dL (ref 8–27)
CO2: 22 mmol/L (ref 20–29)
Calcium: 10.1 mg/dL (ref 8.7–10.3)
Chloride: 103 mmol/L (ref 96–106)
Creatinine, Ser: 1.55 mg/dL — ABNORMAL HIGH (ref 0.57–1.00)
Glucose: 91 mg/dL (ref 70–99)
Potassium: 4.4 mmol/L (ref 3.5–5.2)
Sodium: 140 mmol/L (ref 134–144)
eGFR: 33 mL/min/{1.73_m2} — ABNORMAL LOW (ref >59)

## 2022-08-16 LAB — TSH+FREE T4
Free T4: 1.58 ng/dL (ref 0.82–1.77)
TSH: 0.512 u[IU]/mL (ref 0.450–4.500)

## 2022-08-16 LAB — VITAMIN D 25 HYDROXY (VIT D DEFICIENCY, FRACTURES): Vit D, 25-Hydroxy: 61.1 ng/mL (ref 30.0–100.0)

## 2022-08-16 LAB — B12 AND FOLATE PANEL
Folate: >20 ng/mL (ref >3.0)
Vitamin B-12: 911 pg/mL (ref 232–1245)

## 2022-08-16 LAB — IRON,TIBC AND FERRITIN PANEL
Ferritin: 100 ng/mL (ref 15–150)
Iron Saturation: 20 % (ref 15–55)
Iron: 59 ug/dL (ref 27–139)
Total Iron Binding Capacity: 288 ug/dL (ref 250–450)
UIBC: 229 ug/dL (ref 118–369)

## 2022-08-20 NOTE — Assessment & Plan Note (Addendum)
This appears to be a chronic issue.  She reports today that she does not feel like herself.  She does not have the motivation to exercise like she previously has had. -Repeat labs ordered today to exclude underlying metabolic etiologies -Follow up in 1 month for reassessment

## 2022-08-20 NOTE — Assessment & Plan Note (Signed)
Followed by nephrology (Dr. Theador Hawthorne). Last seen in August. She has follow up scheduled for January.

## 2022-08-20 NOTE — Assessment & Plan Note (Signed)
She remains on Eliquis 2.5 mg twice daily.  No medication changes today.

## 2022-08-20 NOTE — Assessment & Plan Note (Signed)
BP mildly elevated today, 143/96.  She is currently prescribed HCTZ 25 mg daily and losartan 50 mg daily.  She regularly checks her blood pressure at home and reports readings that are well within goal.  No medication changes today.  We will follow-up in 1 month for BP check.

## 2022-09-09 ENCOUNTER — Other Ambulatory Visit: Payer: Self-pay | Admitting: Internal Medicine

## 2022-09-09 DIAGNOSIS — Z86718 Personal history of other venous thrombosis and embolism: Secondary | ICD-10-CM

## 2022-09-09 DIAGNOSIS — Z7901 Long term (current) use of anticoagulants: Secondary | ICD-10-CM

## 2022-09-12 ENCOUNTER — Other Ambulatory Visit: Payer: Self-pay | Admitting: Internal Medicine

## 2022-09-12 ENCOUNTER — Ambulatory Visit (INDEPENDENT_AMBULATORY_CARE_PROVIDER_SITE_OTHER): Payer: Medicare Other | Admitting: Internal Medicine

## 2022-09-12 ENCOUNTER — Encounter: Payer: Self-pay | Admitting: Internal Medicine

## 2022-09-12 VITALS — BP 137/68 | HR 64 | Ht 65.5 in | Wt 184.4 lb

## 2022-09-12 DIAGNOSIS — S161XXA Strain of muscle, fascia and tendon at neck level, initial encounter: Secondary | ICD-10-CM

## 2022-09-12 DIAGNOSIS — I493 Ventricular premature depolarization: Secondary | ICD-10-CM

## 2022-09-12 DIAGNOSIS — I10 Essential (primary) hypertension: Secondary | ICD-10-CM

## 2022-09-12 DIAGNOSIS — R002 Palpitations: Secondary | ICD-10-CM

## 2022-09-12 DIAGNOSIS — M810 Age-related osteoporosis without current pathological fracture: Secondary | ICD-10-CM

## 2022-09-12 NOTE — Patient Instructions (Signed)
It was a pleasure to see you today.  Thank you for giving [place] the opportunity to be involved in your care.  Below is a brief recap of your visit and next steps.  We will plan to see you again in 3 months.  Summary No medication changes today We will check an ECG today and plan for follow up in 3 months

## 2022-09-12 NOTE — Progress Notes (Unsigned)
Established Patient Office Visit  Subjective   Patient ID: Nicole Good, female    DOB: 1941/01/16  Age: 82 y.o. MRN: [id number]  Chief Complaint  Patient presents with   Hypertension    Follow up   Nicole Good returns to care today for follow-up.  She was last seen by me on 12/8 at which time her blood pressure was mildly elevated and she endorsed fatigue.  Labs were ordered and 4-week follow-up was arranged for reassessment.  There have been no acute interval events.  Today Nicole Good states that her fatigue has improved.  She endorses discomfort at the base of the left side of her neck that she believes is a strained muscle from sleeping on her left side.  She also describes a sensation of her heart "skipping a beat".  She otherwise has no acute concerns to discuss.    Past Medical History:  Diagnosis Date   Arthritis    "maybe in my right knee and hip" (12/03/2015)   DVT (deep venous thrombosis) ([employer]) 12/03/2015   LLE   Heart murmur    "when I was a little girl; gone now"   High cholesterol    Hypertension    Osteoporosis    Prolapsed internal hemorrhoids, grade 3 07/04/2014   Pulmonary emboli ([place]) ~ 2012   Right arm weakness 10/30/2015   Past Surgical History:  Procedure Laterality Date   ABDOMINAL HYSTERECTOMY  ~ 1979   "partial"   CATARACT EXTRACTION W/ INTRAOCULAR LENS  IMPLANT, BILATERAL Bilateral 2017   DILATION AND CURETTAGE OF UTERUS     TONSILLECTOMY  1950   Social History   Tobacco Use   Smoking status: Never   Smokeless tobacco: Never  Vaping Use   Vaping Use: Never used  Substance Use Topics   Alcohol use: No   Drug use: No   Family History  Problem Relation Age of Onset   Stroke Mother    Heart disease Mother    Hypertension Maternal Grandmother    No Known Allergies  Review of Systems  Cardiovascular:  Positive for palpitations.  Musculoskeletal:  Positive for neck pain (Left lateral neck).  All other systems reviewed and are negative.     Objective:     BP 137/68   Pulse 64   Ht 5' 5.5" (1.664 m)   Wt 184 lb 6.4 oz (83.6 kg)   SpO2 95%   BMI 30.22 kg/m  BP Readings from Last 3 Encounters:  09/12/22 137/68  08/15/22 (!) 140/88  07/27/22 (!) 127/91   Physical Exam Vitals reviewed.  Constitutional:      General: She is not in acute distress.    Appearance: Normal appearance. She is not toxic-appearing.  HENT:     Head: Normocephalic and atraumatic.     Right Ear: External ear normal.     Left Ear: External ear normal.     Nose: Nose normal. No congestion or rhinorrhea.     Mouth/Throat:     Mouth: Mucous membranes are moist.     Pharynx: Oropharynx is clear. No oropharyngeal exudate or posterior oropharyngeal erythema.  Eyes:     General: No scleral icterus.    Extraocular Movements: Extraocular movements intact.     Conjunctiva/sclera: Conjunctivae normal.     Pupils: Pupils are equal, round, and reactive to light.  Cardiovascular:     Rate and Rhythm: Normal rate and regular rhythm.     Pulses: Normal pulses.     Heart sounds:  Normal heart sounds. No murmur heard.    No friction rub. No gallop.  Pulmonary:     Effort: Pulmonary effort is normal.     Breath sounds: Normal breath sounds. No wheezing, rhonchi or rales.  Abdominal:     General: Abdomen is flat. Bowel sounds are normal. There is no distension.     Palpations: Abdomen is soft.     Tenderness: There is no abdominal tenderness.  Musculoskeletal:        General: No swelling. Normal range of motion.     Cervical back: Normal range of motion. Tenderness (TTP over the left sternocleidomastoid) present.     Right lower leg: No edema.     Left lower leg: No edema.  Lymphadenopathy:     Cervical: No cervical adenopathy.  Skin:    General: Skin is warm and dry.     Capillary Refill: Capillary refill takes less than 2 seconds.     Coloration: Skin is not jaundiced.  Neurological:     General: No focal deficit present.     Mental Status: She  is alert and oriented to person, place, and time.  Psychiatric:        Mood and Affect: Mood normal.        Behavior: Behavior normal.    Last CBC Lab Results  Component Value Date   WBC 6.1 07/27/2022   HGB 13.0 07/27/2022   HCT 40.4 07/27/2022   MCV 92.7 07/27/2022   MCH 29.8 07/27/2022   RDW 13.0 07/27/2022   PLT 210 96/78/9381   Last metabolic panel Lab Results  Component Value Date   GLUCOSE 91 08/15/2022   NA 140 08/15/2022   K 4.4 08/15/2022   CL 103 08/15/2022   CO2 22 08/15/2022   BUN 26 08/15/2022   CREATININE 1.55 (H) 08/15/2022   EGFR 33 (L) 08/15/2022   CALCIUM 10.1 08/15/2022   PHOS 2.6 11/09/2020   PROT 6.8 10/07/2021   ALBUMIN 4.4 10/07/2021   LABGLOB 2.4 10/07/2021   AGRATIO 1.8 10/07/2021   BILITOT 0.3 10/07/2021   ALKPHOS 70 10/07/2021   AST 23 10/07/2021   ALT 21 10/07/2021   ANIONGAP 8 07/27/2022   Last lipids Lab Results  Component Value Date   CHOL 156 02/14/2022   HDL 38 (L) 02/14/2022   LDLCALC 93 02/14/2022   TRIG 142 02/14/2022   CHOLHDL 4.1 02/14/2022   Last hemoglobin A1c Lab Results  Component Value Date   HGBA1C 6.1 (H) 10/07/2021   Last thyroid functions Lab Results  Component Value Date   TSH 0.512 08/15/2022   Last vitamin D Lab Results  Component Value Date   VD25OH 61.1 08/15/2022   Last vitamin B12 and Folate Lab Results  Component Value Date   VITAMINB12 911 08/15/2022   FOLATE >20.0 08/15/2022     Assessment & Plan:   Problem List Items Addressed This Visit       Essential hypertension    Her blood pressure was mildly elevated at her last appointment.  She is currently prescribed HCTZ 25 mg daily and losartan 50 mg daily for treatment of hypertension.  Her blood pressure today is improved, 137/68. -No medication changes today.  Continue current regimen.      Ventricular premature beats    Previously documented history of PVCs.  Today she endorses occasional palpitations. Cardiac exam today is  normal.  ECG obtained today shows mild sinus bradycardia without any evidence of PVCs.  Her symptoms are not associated  with syncope or presyncope and she is not short of breath. -No changes today.  Continue to monitor for now.  If her symptoms worsen we will have her follow-up with cardiology.      Strain of sternocleidomastoid muscle    She endorses pain in the left side of her neck that is worse with movement.  On exam today she has tenderness palpation of the left sternocleidomastoid. -She has been provided with home PT exercises for neck strain and I recommended as needed use of Tylenol for pain relief      Return in about 3 months (around 12/12/2022).    Johnette Abraham, MD

## 2022-09-15 ENCOUNTER — Other Ambulatory Visit: Payer: Self-pay | Admitting: Internal Medicine

## 2022-09-15 DIAGNOSIS — M810 Age-related osteoporosis without current pathological fracture: Secondary | ICD-10-CM

## 2022-09-17 DIAGNOSIS — S161XXA Strain of muscle, fascia and tendon at neck level, initial encounter: Secondary | ICD-10-CM | POA: Insufficient documentation

## 2022-09-17 HISTORY — DX: Strain of muscle, fascia and tendon at neck level, initial encounter: S16.1XXA

## 2022-09-17 NOTE — Assessment & Plan Note (Signed)
Her blood pressure was mildly elevated at her last appointment.  She is currently prescribed HCTZ 25 mg daily and losartan 50 mg daily for treatment of hypertension.  Her blood pressure today is improved, 137/68. -No medication changes today.  Continue current regimen.

## 2022-09-17 NOTE — Assessment & Plan Note (Signed)
She endorses pain in the left side of her neck that is worse with movement.  On exam today she has tenderness palpation of the left sternocleidomastoid. -She has been provided with home PT exercises for neck strain and I recommended as needed use of Tylenol for pain relief

## 2022-09-17 NOTE — Assessment & Plan Note (Signed)
Previously documented history of PVCs.  Today she endorses a sensation of her heart "skipping a beat".  Cardiac exam today is normal.  ECG obtained today shows mild sinus bradycardia without any evidence of PVCs.  Her symptoms are not associated with syncope or presyncope and she is not short of breath. -No changes today.  Continue to monitor for now.  If her symptoms worsen we will have her follow-up with cardiology.

## 2022-09-23 ENCOUNTER — Other Ambulatory Visit: Payer: Self-pay | Admitting: Internal Medicine

## 2022-09-23 DIAGNOSIS — M810 Age-related osteoporosis without current pathological fracture: Secondary | ICD-10-CM

## 2022-10-03 ENCOUNTER — Telehealth: Payer: Self-pay | Admitting: Internal Medicine

## 2022-10-03 NOTE — Telephone Encounter (Signed)
error 

## 2022-10-04 ENCOUNTER — Other Ambulatory Visit: Payer: Self-pay | Admitting: Internal Medicine

## 2022-10-07 ENCOUNTER — Encounter: Payer: Self-pay | Admitting: Internal Medicine

## 2022-10-07 ENCOUNTER — Ambulatory Visit (INDEPENDENT_AMBULATORY_CARE_PROVIDER_SITE_OTHER): Payer: Medicare Other | Admitting: Internal Medicine

## 2022-10-07 VITALS — BP 127/86 | HR 74 | Ht 65.0 in | Wt 183.8 lb

## 2022-10-07 DIAGNOSIS — I493 Ventricular premature depolarization: Secondary | ICD-10-CM

## 2022-10-07 DIAGNOSIS — R002 Palpitations: Secondary | ICD-10-CM | POA: Diagnosis not present

## 2022-10-07 MED ORDER — METOPROLOL SUCCINATE ER 25 MG PO TB24: 25.0000 mg | ORAL_TABLET | Freq: Every day | ORAL | 2 refills | Status: DC

## 2022-10-07 MED ORDER — METOPROLOL SUCCINATE ER 25 MG PO TB24: 12.5000 mg | ORAL_TABLET | Freq: Every day | ORAL | 2 refills | Status: DC

## 2022-10-07 NOTE — Assessment & Plan Note (Signed)
Returning to care today for evaluation of heart palpitations.  She has been experiencing these on the basis and endorses associated fatigue.  Denies dizziness/lightheadedness as well as shortness of breath.  She has a previous history of seizures.  ECG repeated today shows PACs.  She is otherwise hemodynamically stable. -Start Toprol-XL 12.5 mg daily -Will work on arranging cardiology follow-up -Follow-up with me in 2 weeks for reassessment

## 2022-10-07 NOTE — Patient Instructions (Signed)
It was a pleasure to see you today.  Thank you for giving [place] the opportunity to be involved in your care.  Below is a brief recap of your visit and next steps.  We will plan to see you again in 2 weeks.   Summary Start metoprolol 25 mg daily I recommend following up with cardiology We will follow up in 2 weeks to reassess your symptoms

## 2022-10-07 NOTE — Progress Notes (Signed)
Acute Office Visit  Subjective:     Patient ID: Nicole Good, female    DOB: 1940/10/12, 82 y.o.   MRN: [id number]  Chief Complaint  Patient presents with   Palpitations    Patient feels like she is still having palpitations /fluttering. States she feels there is an extra beat in her heart.She explains she's overly tired and has no energy   Nicole Good presents for an acute visit today to discuss heart palpitations.  She was last seen by me on 1/5 for HTN follow-up.  At that time she endorsed occasional palpitations.  ECG at that time showed sinus bradycardia with no evidence of PVCs.  Of note, she has a prior history of PVCs.  Evaluated by cardiology in June 2021 and underwent TTE that was normal and 48-hour Holter monitor.  Holter monitor showed short runs of SVT < 1% PACs/PVCs.  She has not been seen by cardiology for follow-up since that time.  Nicole Good endorses feeling more tired than usual.  She denies dizziness and shortness of breath.  Review of Systems  Constitutional:  Positive for malaise/fatigue.  Respiratory:  Negative for shortness of breath.   Cardiovascular:  Positive for palpitations.  Neurological:  Negative for dizziness.      Objective:    BP 127/86 (BP Location: Right Arm, Patient Position: Sitting, Cuff Size: Normal)   Pulse 74   Ht '5\' 5"'$  (1.651 m)   Wt 183 lb 12.8 oz (83.4 kg)   SpO2 99%   BMI 30.59 kg/m   Physical Exam Vitals reviewed.  Constitutional:      General: She is not in acute distress.    Appearance: Normal appearance. She is not toxic-appearing.  HENT:     Head: Normocephalic and atraumatic.     Right Ear: External ear normal.     Left Ear: External ear normal.     Nose: Nose normal. No congestion or rhinorrhea.     Mouth/Throat:     Mouth: Mucous membranes are moist.     Pharynx: Oropharynx is clear. No oropharyngeal exudate or posterior oropharyngeal erythema.  Eyes:     General: No scleral icterus.    Extraocular Movements:  Extraocular movements intact.     Conjunctiva/sclera: Conjunctivae normal.     Pupils: Pupils are equal, round, and reactive to light.  Cardiovascular:     Rate and Rhythm: Normal rate and regular rhythm.     Pulses: Normal pulses.     Heart sounds: Normal heart sounds. No murmur heard.    No friction rub. No gallop.  Pulmonary:     Effort: Pulmonary effort is normal.     Breath sounds: Normal breath sounds. No wheezing, rhonchi or rales.  Abdominal:     General: Abdomen is flat. Bowel sounds are normal. There is no distension.     Palpations: Abdomen is soft.     Tenderness: There is no abdominal tenderness.  Musculoskeletal:        General: No swelling. Normal range of motion.     Cervical back: Normal range of motion.     Right lower leg: No edema.     Left lower leg: No edema.  Lymphadenopathy:     Cervical: No cervical adenopathy.  Skin:    General: Skin is warm and dry.     Capillary Refill: Capillary refill takes less than 2 seconds.     Coloration: Skin is not jaundiced.  Neurological:     General: No focal deficit  present.     Mental Status: She is alert and oriented to person, place, and time.  Psychiatric:        Mood and Affect: Mood normal.        Behavior: Behavior normal.       Assessment & Plan:   Problem List Items Addressed This Visit       Heart palpitations    Returning to care today for evaluation of heart palpitations.  She has been experiencing these on the basis and endorses associated fatigue.  Denies dizziness/lightheadedness as well as shortness of breath.  She has a previous history of seizures.  ECG repeated today shows PACs.  She is otherwise hemodynamically stable. -Start Toprol-XL 12.5 mg daily -Will work on arranging cardiology follow-up -Follow-up with me in 2 weeks for reassessment       Meds ordered this encounter  Medications   metoprolol succinate (TOPROL-XL) 25 MG 24 hr tablet    Sig: Take 0.5 tablets (12.5 mg total) by mouth  daily.    Dispense:  15 tablet    Refill:  2    Return in about 2 weeks (around 10/21/2022) for heart palpitations.  Johnette Abraham, MD

## 2022-10-08 ENCOUNTER — Ambulatory Visit: Payer: Medicare Other | Attending: Cardiology | Admitting: Cardiology

## 2022-10-09 ENCOUNTER — Encounter: Payer: Self-pay | Admitting: Cardiology

## 2022-10-17 ENCOUNTER — Ambulatory Visit: Payer: Medicare Other | Attending: Cardiology | Admitting: Cardiology

## 2022-10-17 ENCOUNTER — Encounter: Payer: Self-pay | Admitting: Cardiology

## 2022-10-17 VITALS — BP 140/86 | HR 55 | Ht 65.5 in | Wt 182.2 lb

## 2022-10-17 DIAGNOSIS — I10 Essential (primary) hypertension: Secondary | ICD-10-CM | POA: Insufficient documentation

## 2022-10-17 DIAGNOSIS — N1832 Chronic kidney disease, stage 3b: Secondary | ICD-10-CM | POA: Diagnosis present

## 2022-10-17 DIAGNOSIS — E782 Mixed hyperlipidemia: Secondary | ICD-10-CM | POA: Insufficient documentation

## 2022-10-17 DIAGNOSIS — R002 Palpitations: Secondary | ICD-10-CM | POA: Insufficient documentation

## 2022-10-17 DIAGNOSIS — R5382 Chronic fatigue, unspecified: Secondary | ICD-10-CM | POA: Diagnosis present

## 2022-10-17 DIAGNOSIS — I824Y9 Acute embolism and thrombosis of unspecified deep veins of unspecified proximal lower extremity: Secondary | ICD-10-CM | POA: Diagnosis present

## 2022-10-17 NOTE — Patient Instructions (Addendum)
Medication Instructions:  Your physician recommends that you continue on your current medications as directed. Please refer to the Current Medication list given to you today.  *If you need a refill on your cardiac medications before your next appointment, please call your pharmacy*   Lab Work: NONE   If you have labs (blood work) drawn today and your tests are completely normal, you will receive your results only by: [facility] (if you have MyChart) OR A paper copy in the mail If you have any lab test that is abnormal or we need to change your treatment, we will call you to review the results.   Testing/Procedures: Your physician has requested that you have an echocardiogram. Echocardiography is a painless test that uses sound waves to create images of your heart. It provides your doctor with information about the size and shape of your heart and how well your heart's chambers and valves are working. This procedure takes approximately one hour. There are no restrictions for this procedure. Please do NOT wear cologne, perfume, aftershave, or lotions (deodorant is allowed). Please arrive 15 minutes prior to your appointment time.    Follow-Up: At [facility], you and your health needs are our priority.  As part of our continuing mission to provide you with exceptional heart care, we have created designated Provider Care Teams.  These Care Teams include your primary Cardiologist (physician) and Advanced Practice Providers (APPs -  Physician Assistants and Nurse Practitioners) who all work together to provide you with the care you need, when you need it.  We recommend signing up for the patient portal called "MyChart".  Sign up information is provided on this After Visit Summary.  MyChart is used to connect with patients for Virtual Visits (Telemedicine).  Patients are able to view lab/test results, encounter notes, upcoming appointments, etc.  Non-urgent messages can be sent to  your provider as well.   To learn more about what you can do with MyChart, go to [email or web address].    Your next appointment:   6 month(s)  Provider:   Jenkins Rouge, MD    Other Instructions Thank you for choosing [facility]!

## 2022-10-17 NOTE — Progress Notes (Signed)
Cardiology Clinic Note   Patient Name: Nicole Good Date of Encounter: 10/17/2022  Primary Care Provider:  Johnette Abraham, MD Primary Cardiologist:  None  Patient Profile    82 year old female with a past medical history of essential hypertension, history of DVT, history of PE, abnormal EKG with PVCs, sleep apnea, Barrett's esophagus, osteoporosis, CKD stage IIIb, hypertriglyceridemia, prediabetes, and fatigue, who is here today to follow-up on increased amount of palpitations.  Past Medical History    Past Medical History:  Diagnosis Date   Arthritis    "maybe in my right knee and hip" (12/03/2015)   DVT (deep venous thrombosis) ([place]) 12/03/2015   LLE   Heart murmur    "when I was a little girl; gone now"   High cholesterol    Hypertension    Osteoporosis    Prolapsed internal hemorrhoids, grade 3 07/04/2014   Pulmonary emboli ([place]) ~ 2012   Right arm weakness 10/30/2015   Past Surgical History:  Procedure Laterality Date   ABDOMINAL HYSTERECTOMY  ~ 1979   "partial"   CATARACT EXTRACTION W/ INTRAOCULAR LENS  IMPLANT, BILATERAL Bilateral 2017   DILATION AND CURETTAGE OF UTERUS     TONSILLECTOMY  1950    Allergies  No Known Allergies  History of Present Illness    Nicole Good is a 82 year old female with previously mentioned past medical history of essential hypertension, history of DVT and 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  She was last seen in clinic/02/22/2020 for abnormal EKG with PVCs and complaints of fatigue.  With her complaints without obvious cardiac etiology she was ordered an echocardiogram to assess her EF and placed on a 48-hour Holter monitor to assess the burden of PVCs.  Echocardiogram completed 02/2020 revealed LVEF of 65-70%, no regional wall motion  abnormalities, G1 DD, mild to moderately dilated left atrium, trivial mitral regurgitation.  She did wear a Holter monitor which revealed short runs of SVT with the PAC and PVC burden less than 1%, no sustained arrhythmias, if she continued to have palpitations they could try the low-dose of Toprol 25 mg daily but if she continued to be asymptomatic they would just observe for the time being.  She was evaluated at the [facility] emergency department on 07/27/2022 with complaints of fatigue and was found to be positive for acute cystitis without hematuria and dehydration she was given 1 g of Rocephin as well as a liter of IV fluids.  She was then discharged with a prescription for Keflex.   She returns to clinic today with complaints of fatigue and previously 2 months of worsening palpitations.  She did follow-up with her PCP Dr. Doren Good who placed her on 12.5 mg of Toprol-XL since that time her palpitations have resolved but she continues to have fatigue.  She denies any chest pain, shortness of breath, dyspnea on exertion, or peripheral edema.  She denies any hospitalizations or visits to the emergency department.  Home Medications    Current Outpatient Medications  Medication Sig Dispense Refill   Calcium Carb-Cholecalciferol (OYSTER SHELL CALCIUM W/D) 500-5 MG-MCG TABS TAKE 1 TABLET BY MOUTH THREE TIMES A DAY 90 tablet 0   ELIQUIS 2.5 MG TABS tablet TAKE 1 TABLET BY MOUTH TWICE A DAY 60 tablet 0   hydrochlorothiazide (HYDRODIURIL) 25 MG tablet TAKE 1 TABLET (25 MG TOTAL) BY MOUTH  DAILY. 90 tablet 1   losartan (COZAAR) 50 MG tablet TAKE 1 TABLET BY MOUTH EVERY DAY 90 tablet 1   metoprolol succinate (TOPROL-XL) 25 MG 24 hr tablet Take 0.5 tablets (12.5 mg total) by mouth daily. 15 tablet 2   Multiple Vitamins-Minerals (MULTIVITAMINS THER. W/MINERALS) TABS Take 1 tablet by mouth every morning.     pantoprazole (PROTONIX) 40 MG tablet TAKE 1 TABLET BY MOUTH EVERY DAY 90 tablet 0   polyethylene glycol powder  (GLYCOLAX/MIRALAX) 17 GM/SCOOP powder Take 17 g by mouth daily as needed. 3350 g 1   rosuvastatin (CRESTOR) 10 MG tablet TAKE 1 TABLET BY MOUTH EVERY DAY 90 tablet 3   No current facility-administered medications for this visit.     Family History    Family History  Problem Relation Age of Onset   Stroke Mother    Heart disease Mother    Hypertension Maternal Grandmother    She indicated that her mother is deceased. She indicated that her father is deceased. She indicated that her sister is alive. She indicated that the status of her maternal grandmother is unknown.  Social History    Social History   Socioeconomic History   Marital status: Married    Spouse name: Not on file   Number of children: 1   Years of education: MA   Highest education level: Not on file  Occupational History   Occupation: retired    Comment: middle school principle   Tobacco Use   Smoking status: Never   Smokeless tobacco: Never  Scientific laboratory technician Use: Never used  Substance and Sexual Activity   Alcohol use: No   Drug use: No   Sexual activity: Never  Other Topics Concern   Not on file  Social History Narrative   Lives with husband-married 39 years       1 son- no grandkids    Right handed       Enjoys: reads, cleaned and organized, piano       Diet: eats all foods groups and tries to balanced diet well-occasionally hot dog and red meat   Caffeine: hot chocolate, green tea   Water: 3 liters      Exercise 55 minutes 5 days of week       Wears seat belt   Smoke detectors at home   Does not use phone while driving   Social Determinants of Health   Financial Resource Strain: Low Risk  (01/15/2021)   Overall Financial Resource Strain (CARDIA)    Difficulty of Paying Living Expenses: Not hard at all  Food Insecurity: No Food Insecurity (01/15/2021)   Hunger Vital Sign    Worried About Running Out of Food in the Last Year: Never true    [place] in the Last Year: Never true   Transportation Needs: No Transportation Needs (01/15/2021)   PRAPARE - Hydrologist (Medical): No    Lack of Transportation (Non-Medical): No  Physical Activity: Sufficiently Active (01/15/2021)   Exercise Vital Sign    Days of Exercise per Week: 4 days    Minutes of Exercise per Session: 40 min  Stress: No Stress Concern Present (01/15/2021)   [place]    Feeling of Stress : Not at all  Social Connections: [place] (01/15/2021)   Social Connection and Isolation Panel [NHANES]    Frequency of Communication with Friends and Family: More than three times  a week    Frequency of Social Gatherings with Friends and Family: More than three times a week    Attends Religious Services: More than 4 times per year    Active Member of [employer] or Organizations: No    Attends Music therapist: 1 to 4 times per year    Marital Status: Married  Human resources officer Violence: Not At Risk (01/15/2021)   Humiliation, Afraid, Rape, and Kick questionnaire    Fear of Current or Ex-Partner: No    Emotionally Abused: No    Physically Abused: No    Sexually Abused: No     Review of Systems    General:  No chills, fever, night sweats or weight changes.  Endorses fatigue Cardiovascular:  No chest pain, dyspnea on exertion, edema, orthopnea, endorsed previous 2 months of palpitations, paroxysmal nocturnal dyspnea. Dermatological: No rash, lesions/masses Respiratory: No cough, dyspnea Urologic: No hematuria, dysuria Abdominal:   No nausea, vomiting, diarrhea, bright red blood per rectum, melena, or hematemesis Neurologic:  No visual changes, wkns, changes in mental status. All other systems reviewed and are otherwise negative except as noted above.   Physical Exam    VS:  BP (!) 140/86   Pulse (!) 55   Ht 5' 5.5" (1.664 m)   Wt 182 lb 3.2 oz (82.6 kg)   SpO2 99%   BMI 29.86 kg/m  , BMI Body  mass index is 29.86 kg/m.     GEN: Well nourished, well developed, in no acute distress. HEENT: normal. Neck: Supple, no JVD, carotid bruits, or masses. Cardiac: RRR, bradycardic, no murmurs, rubs, or gallops. No clubbing, cyanosis, edema.  Radials 2+/PT 2+ and equal bilaterally.  Respiratory:  Respirations regular and unlabored, clear to auscultation bilaterally. GI: Soft, nontender, nondistended, BS + x 4. MS: no deformity or atrophy. Skin: warm and dry, no rash. Neuro:  Strength and sensation are intact. Psych: Normal affect.  Accessory Clinical Findings    ECG personally reviewed by me today-no new tracings were completed today  Lab Results  Component Value Date   WBC 6.1 07/27/2022   HGB 13.0 07/27/2022   HCT 40.4 07/27/2022   MCV 92.7 07/27/2022   PLT 210 07/27/2022   Lab Results  Component Value Date   CREATININE 1.55 (H) 08/15/2022   BUN 26 08/15/2022   NA 140 08/15/2022   K 4.4 08/15/2022   CL 103 08/15/2022   CO2 22 08/15/2022   Lab Results  Component Value Date   ALT 21 10/07/2021   AST 23 10/07/2021   ALKPHOS 70 10/07/2021   BILITOT 0.3 10/07/2021   Lab Results  Component Value Date   CHOL 156 02/14/2022   HDL 38 (L) 02/14/2022   LDLCALC 93 02/14/2022   TRIG 142 02/14/2022   CHOLHDL 4.1 02/14/2022    Lab Results  Component Value Date   HGBA1C 6.1 (H) 10/07/2021    Assessment & Plan   1.  Continued fatigue.  She remains active and is likely not from deconditioning.  She denies today if it primarily was from age.  She has been encouraged to continue with her activity.  She is also being scheduled for repeat echocardiogram to assess her EF to ensure there is been no changes previous study done in 21 showed EF 65 to 70% with G1 DD and no valvular abnormalities.  2.  Palpitations for 2 months prior to following up with her PCP who recently started on 12.5 mg of Toprol-XL.  Since starting the  medication her symptoms have resolved with exception of the  fatigue.  We did discuss repeating a ZIO XT monitor but with her symptoms being resolved and her on current beta-blocker therapy the monitor would likely be unrevealing.  Previous EKGs were reviewed that were completed by her PCP showed sinus bradycardia with occasional PAC.  She has been continued on Toprol-XL 12.5 mg daily.  Blood work was unrevealing as well with a normal TSH, no signs of infection with elevated WBC, no anemia, and she is also been encouraged to remain hydrated and decrease stress.  3.  History of DVT and PE without incidence of recurrence as she is continued on apixaban 2.5 mg twice daily as she does meet the age requirement and elevated serum creatinine for reduced dosing.  4.  Hypertension with blood pressure today 140/86.  She is continued on HCTZ 25 mg daily, losartan 50 mg daily, and Toprol-XL 12.5 mg daily.  She is also encouraged to continue to monitor her blood pressures at home.  5.  Hyperlipidemia with an LDL of 93 in 2023.  She is continued on rosuvastatin 10 mg daily this continues to be monitored by her PCP.  6.  Chronic renal failure with a serum creatinine of 1.55 in 08/2022.  She is encouraged to maintain hydration and warm and cool weather as dehydration can be a component of elevated serum creatinine.  She continues to be followed by nephrology.  7.  Disposition patient to return to clinic to see MD/APP in 6 months or sooner if needed related to symptomology.  Kena Limon, NP 10/17/2022, 3:28 PM

## 2022-10-23 ENCOUNTER — Ambulatory Visit (INDEPENDENT_AMBULATORY_CARE_PROVIDER_SITE_OTHER): Payer: Medicare Other | Admitting: Internal Medicine

## 2022-10-23 ENCOUNTER — Encounter: Payer: Self-pay | Admitting: Internal Medicine

## 2022-10-23 VITALS — BP 126/80 | HR 52 | Ht 65.5 in | Wt 183.8 lb

## 2022-10-23 DIAGNOSIS — R002 Palpitations: Secondary | ICD-10-CM

## 2022-10-23 NOTE — Progress Notes (Signed)
Established Patient Office Visit  Subjective   Patient ID: Nicole Good, female    DOB: 1941/06/04  Age: 82 y.o. MRN: [id number]  Chief Complaint  Patient presents with   Palpitations    Follow up   Ms. Nicole Good returns to care today for follow-up of heart palpitations.  She was last seen by me on 1/30 at which time she continued to endorse frequent heart palpitations associated with fatigue.  ECG demonstrated occasional PACs.  Metoprolol succinate 12.5 mg daily was prescribed.  2-week follow-up was arranged.  In the interim she has been seen by cardiology for follow-up.  No medication changes were made and an echocardiogram was ordered.  This is scheduled for 3/4.  Ms. Nicole Good states that her palpitations are "much better" today.  Her symptoms have significantly improved.  She has no additional concerns to discuss today.  Past Medical History:  Diagnosis Date   Arthritis    "maybe in my right knee and hip" (12/03/2015)   DVT (deep venous thrombosis) ([place]) 12/03/2015   LLE   Heart murmur    "when I was a little girl; gone now"   High cholesterol    Hypertension    Osteoporosis    Prolapsed internal hemorrhoids, grade 3 07/04/2014   Pulmonary emboli ([place]) ~ 2012   Right arm weakness 10/30/2015   Past Surgical History:  Procedure Laterality Date   ABDOMINAL HYSTERECTOMY  ~ 1979   "partial"   CATARACT EXTRACTION W/ INTRAOCULAR LENS  IMPLANT, BILATERAL Bilateral 2017   DILATION AND CURETTAGE OF UTERUS     TONSILLECTOMY  1950   Social History   Tobacco Use   Smoking status: Never   Smokeless tobacco: Never  Vaping Use   Vaping Use: Never used  Substance Use Topics   Alcohol use: No   Drug use: No   Family History  Problem Relation Age of Onset   Stroke Mother    Heart disease Mother    Hypertension Maternal Grandmother    No Known Allergies  Review of Systems  Constitutional:  Negative for chills and fever.  HENT:  Negative for sore throat.   Respiratory:  Negative  for cough and shortness of breath.   Cardiovascular:  Negative for chest pain, palpitations and leg swelling.  Gastrointestinal:  Negative for abdominal pain, blood in stool, constipation, diarrhea, nausea and vomiting.  Genitourinary:  Negative for dysuria and hematuria.  Musculoskeletal:  Negative for myalgias.  Skin:  Negative for itching and rash.  Neurological:  Negative for dizziness and headaches.  Psychiatric/Behavioral:  Negative for depression and suicidal ideas.      Objective:     BP 126/80   Pulse (!) 52   Ht 5' 5.5" (1.664 m)   Wt 183 lb 12.8 oz (83.4 kg)   SpO2 96%   BMI 30.12 kg/m  BP Readings from Last 3 Encounters:  10/23/22 126/80  10/17/22 (!) 140/86  10/07/22 127/86   Physical Exam Vitals reviewed.  Constitutional:      General: She is not in acute distress.    Appearance: Normal appearance. She is not toxic-appearing.  HENT:     Head: Normocephalic and atraumatic.     Right Ear: External ear normal.     Left Ear: External ear normal.     Nose: Nose normal. No congestion or rhinorrhea.     Mouth/Throat:     Mouth: Mucous membranes are moist.     Pharynx: Oropharynx is clear. No oropharyngeal exudate or posterior  oropharyngeal erythema.  Eyes:     General: No scleral icterus.    Extraocular Movements: Extraocular movements intact.     Conjunctiva/sclera: Conjunctivae normal.     Pupils: Pupils are equal, round, and reactive to light.  Cardiovascular:     Rate and Rhythm: Normal rate and regular rhythm.     Pulses: Normal pulses.     Heart sounds: Normal heart sounds. No murmur heard.    No friction rub. No gallop.  Pulmonary:     Effort: Pulmonary effort is normal.     Breath sounds: Normal breath sounds. No wheezing, rhonchi or rales.  Abdominal:     General: Abdomen is flat. Bowel sounds are normal. There is no distension.     Palpations: Abdomen is soft.     Tenderness: There is no abdominal tenderness.  Musculoskeletal:        General:  No swelling. Normal range of motion.     Cervical back: Normal range of motion.     Right lower leg: No edema.     Left lower leg: No edema.  Lymphadenopathy:     Cervical: No cervical adenopathy.  Skin:    General: Skin is warm and dry.     Capillary Refill: Capillary refill takes less than 2 seconds.     Coloration: Skin is not jaundiced.  Neurological:     General: No focal deficit present.     Mental Status: She is alert and oriented to person, place, and time.  Psychiatric:        Mood and Affect: Mood normal.        Behavior: Behavior normal.   Last CBC Lab Results  Component Value Date   WBC 6.1 07/27/2022   HGB 13.0 07/27/2022   HCT 40.4 07/27/2022   MCV 92.7 07/27/2022   MCH 29.8 07/27/2022   RDW 13.0 07/27/2022   PLT 210 [id number]   Last metabolic panel Lab Results  Component Value Date   GLUCOSE 91 08/15/2022   NA 140 08/15/2022   K 4.4 08/15/2022   CL 103 08/15/2022   CO2 22 08/15/2022   BUN 26 08/15/2022   CREATININE 1.55 (H) 08/15/2022   EGFR 33 (L) 08/15/2022   CALCIUM 10.1 08/15/2022   PHOS 2.6 11/09/2020   PROT 6.8 10/07/2021   ALBUMIN 4.4 10/07/2021   LABGLOB 2.4 10/07/2021   AGRATIO 1.8 10/07/2021   BILITOT 0.3 10/07/2021   ALKPHOS 70 10/07/2021   AST 23 10/07/2021   ALT 21 10/07/2021   ANIONGAP 8 07/27/2022   Last lipids Lab Results  Component Value Date   CHOL 156 02/14/2022   HDL 38 (L) 02/14/2022   LDLCALC 93 02/14/2022   TRIG 142 02/14/2022   CHOLHDL 4.1 02/14/2022   Last hemoglobin A1c Lab Results  Component Value Date   HGBA1C 6.1 (H) 10/07/2021   Last thyroid functions Lab Results  Component Value Date   TSH 0.512 08/15/2022   Last vitamin D Lab Results  Component Value Date   VD25OH 61.1 08/15/2022   Last vitamin B12 and Folate Lab Results  Component Value Date   VITAMINB12 911 08/15/2022   FOLATE >20.0 08/15/2022     Assessment & Plan:   Problem List Items Addressed This Visit       Heart palpitations  - Primary    Symptoms have significantly improved since starting Toprol-XL 12.5 mg daily.  She is scheduled to undergo TTE on 3/4. -No medication changes today.  Continue Toprol-XL 12.5 mg daily. -  Follow-up in 3 months      Return in about 3 months (around 01/21/2023).   Johnette Abraham, MD

## 2022-10-23 NOTE — Patient Instructions (Signed)
It was a pleasure to see you today.  Thank you for giving [place] the opportunity to be involved in your care.  Below is a brief recap of your visit and next steps.  We will plan to see you again in 3 months.  Summary No medication changes today. I am glad your symptoms have improved.  We will plan for follow up in 3 months

## 2022-10-23 NOTE — Assessment & Plan Note (Signed)
Symptoms have significantly improved since starting Toprol-XL 12.5 mg daily.  She is scheduled to undergo TTE on 3/4. -No medication changes today.  Continue Toprol-XL 12.5 mg daily. -Follow-up in 3 months

## 2022-11-10 ENCOUNTER — Ambulatory Visit (HOSPITAL_COMMUNITY)
Admission: RE | Admit: 2022-11-10 | Discharge: 2022-11-10 | Disposition: A | Discharge status: Home / Self Care | Payer: Medicare Other | Source: Ambulatory Visit | Attending: Cardiology | Admitting: Cardiology

## 2022-11-10 DIAGNOSIS — I10 Essential (primary) hypertension: Secondary | ICD-10-CM | POA: Diagnosis present

## 2022-11-10 DIAGNOSIS — R002 Palpitations: Secondary | ICD-10-CM | POA: Diagnosis present

## 2022-11-10 LAB — ECHOCARDIOGRAM COMPLETE
AR max vel: 2.57 cm2
AV Area VTI: 2.39 cm2
AV Area mean vel: 2.52 cm2
AV Mean grad: 5.5 mmHg
AV Peak grad: 11.4 mmHg
Ao pk vel: 1.69 m/s
Area-P 1/2: 3.77 cm2
MV M vel: 5.08 m/s
MV Peak grad: 103.2 mmHg
S' Lateral: 2.4 cm

## 2022-11-10 NOTE — Progress Notes (Signed)
*  PRELIMINARY RESULTS* Echocardiogram 2D Echocardiogram has been performed.  Nicole Good 11/10/2022, 4:03 PM

## 2022-11-20 ENCOUNTER — Other Ambulatory Visit: Payer: Self-pay | Admitting: Internal Medicine

## 2022-11-20 DIAGNOSIS — M810 Age-related osteoporosis without current pathological fracture: Secondary | ICD-10-CM

## 2022-12-15 ENCOUNTER — Ambulatory Visit: Payer: Medicare Other | Admitting: Internal Medicine

## 2022-12-16 ENCOUNTER — Other Ambulatory Visit: Payer: Self-pay | Admitting: Internal Medicine

## 2022-12-16 DIAGNOSIS — M810 Age-related osteoporosis without current pathological fracture: Secondary | ICD-10-CM

## 2022-12-19 ENCOUNTER — Ambulatory Visit (INDEPENDENT_AMBULATORY_CARE_PROVIDER_SITE_OTHER): Payer: Medicare Other | Admitting: Internal Medicine

## 2022-12-19 ENCOUNTER — Encounter: Payer: Self-pay | Admitting: Internal Medicine

## 2022-12-19 VITALS — BP 109/61 | HR 102 | Ht 65.5 in | Wt 182.4 lb

## 2022-12-19 DIAGNOSIS — I10 Essential (primary) hypertension: Secondary | ICD-10-CM

## 2022-12-19 DIAGNOSIS — E782 Mixed hyperlipidemia: Secondary | ICD-10-CM | POA: Diagnosis not present

## 2022-12-19 DIAGNOSIS — I824Y9 Acute embolism and thrombosis of unspecified deep veins of unspecified proximal lower extremity: Secondary | ICD-10-CM | POA: Diagnosis not present

## 2022-12-19 DIAGNOSIS — K2270 Barrett's esophagus without dysplasia: Secondary | ICD-10-CM | POA: Diagnosis not present

## 2022-12-19 NOTE — Progress Notes (Unsigned)
Established Patient Office Visit  Subjective   Patient ID: Nicole Good, female    DOB: 1940-10-26  Age: 82 y.o. MRN: [id number]  Chief Complaint  Patient presents with   Hyperlipidemia    Follow up   Nicole Good returns to care today for follow-up.  She was last evaluated by me on 2/15 for follow-up of heart palpitations.  There have been no acute interval events.  Nicole Good reports feeling well today.  She is asymptomatic and has no acute concerns to discuss.  Past Medical History:  Diagnosis Date   Arthritis    "maybe in my right knee and hip" (12/03/2015)   DVT (deep venous thrombosis) 12/03/2015   LLE   Heart murmur    "when I was a little girl; gone now"   High cholesterol    Hypertension    Osteoporosis    Prolapsed internal hemorrhoids, grade 3 07/04/2014   Pulmonary emboli ~ 2012   Right arm weakness 10/30/2015   Past Surgical History:  Procedure Laterality Date   ABDOMINAL HYSTERECTOMY  ~ 1979   "partial"   CATARACT EXTRACTION W/ INTRAOCULAR LENS  IMPLANT, BILATERAL Bilateral 2017   DILATION AND CURETTAGE OF UTERUS     TONSILLECTOMY  1950   Social History   Tobacco Use   Smoking status: Never   Smokeless tobacco: Never  Vaping Use   Vaping Use: Never used  Substance Use Topics   Alcohol use: No   Drug use: No   Family History  Problem Relation Age of Onset   Stroke Mother    Heart disease Mother    Hypertension Maternal Grandmother    No Known Allergies  Review of Systems  Constitutional:  Negative for chills and fever.  HENT:  Negative for sore throat.   Respiratory:  Negative for cough and shortness of breath.   Cardiovascular:  Negative for chest pain, palpitations and leg swelling.  Gastrointestinal:  Negative for abdominal pain, blood in stool, constipation, diarrhea, nausea and vomiting.  Genitourinary:  Negative for dysuria and hematuria.  Musculoskeletal:  Negative for myalgias.  Skin:  Negative for itching and rash.  Neurological:   Negative for dizziness and headaches.  Psychiatric/Behavioral:  Negative for depression and suicidal ideas.      Objective:     BP 109/61   Pulse (!) 102   Ht 5' 5.5" (1.664 m)   Wt 182 lb 6.4 oz (82.7 kg)   SpO2 95%   BMI 29.89 kg/m  BP Readings from Last 3 Encounters:  12/19/22 109/61  10/23/22 126/80  10/17/22 (!) 140/86   Physical Exam Vitals reviewed.  Constitutional:      General: She is not in acute distress.    Appearance: Normal appearance. She is not toxic-appearing.  HENT:     Head: Normocephalic and atraumatic.     Right Ear: External ear normal.     Left Ear: External ear normal.     Nose: Nose normal. No congestion or rhinorrhea.     Mouth/Throat:     Mouth: Mucous membranes are moist.     Pharynx: Oropharynx is clear. No oropharyngeal exudate or posterior oropharyngeal erythema.  Eyes:     General: No scleral icterus.    Extraocular Movements: Extraocular movements intact.     Conjunctiva/sclera: Conjunctivae normal.     Pupils: Pupils are equal, round, and reactive to light.  Cardiovascular:     Rate and Rhythm: Normal rate and regular rhythm.     Pulses: Normal  pulses.     Heart sounds: Normal heart sounds. No murmur heard.    No friction rub. No gallop.  Pulmonary:     Effort: Pulmonary effort is normal.     Breath sounds: Normal breath sounds. No wheezing, rhonchi or rales.  Abdominal:     General: Abdomen is flat. Bowel sounds are normal. There is no distension.     Palpations: Abdomen is soft.     Tenderness: There is no abdominal tenderness.  Musculoskeletal:        General: No swelling. Normal range of motion.     Cervical back: Normal range of motion.     Right lower leg: No edema.     Left lower leg: No edema.  Lymphadenopathy:     Cervical: No cervical adenopathy.  Skin:    General: Skin is warm and dry.     Capillary Refill: Capillary refill takes less than 2 seconds.     Coloration: Skin is not jaundiced.  Neurological:      General: No focal deficit present.     Mental Status: She is alert and oriented to person, place, and time.  Psychiatric:        Mood and Affect: Mood normal.        Behavior: Behavior normal.   Last CBC Lab Results  Component Value Date   WBC 6.1 07/27/2022   HGB 13.0 07/27/2022   HCT 40.4 07/27/2022   MCV 92.7 07/27/2022   MCH 29.8 07/27/2022   RDW 13.0 07/27/2022   PLT 210 07/27/2022   Last metabolic panel Lab Results  Component Value Date   GLUCOSE 91 08/15/2022   NA 140 08/15/2022   K 4.4 08/15/2022   CL 103 08/15/2022   CO2 22 08/15/2022   BUN 26 08/15/2022   CREATININE 1.55 (H) 08/15/2022   EGFR 33 (L) 08/15/2022   CALCIUM 10.1 08/15/2022   PHOS 2.6 11/09/2020   PROT 6.8 10/07/2021   ALBUMIN 4.4 10/07/2021   LABGLOB 2.4 10/07/2021   AGRATIO 1.8 10/07/2021   BILITOT 0.3 10/07/2021   ALKPHOS 70 10/07/2021   AST 23 10/07/2021   ALT 21 10/07/2021   ANIONGAP 8 07/27/2022   Last lipids Lab Results  Component Value Date   CHOL 156 02/14/2022   HDL 38 (L) 02/14/2022   LDLCALC 93 02/14/2022   TRIG 142 02/14/2022   CHOLHDL 4.1 02/14/2022   Last hemoglobin A1c Lab Results  Component Value Date   HGBA1C 6.1 (H) 10/07/2021   Last thyroid functions Lab Results  Component Value Date   TSH 0.512 08/15/2022   Last vitamin D Lab Results  Component Value Date   VD25OH 61.1 08/15/2022   Last vitamin B12 and Folate Lab Results  Component Value Date   VITAMINB12 911 08/15/2022   FOLATE >20.0 08/15/2022     Assessment & Plan:   Problem List Items Addressed This Visit       Deep venous thrombosis    She remains on Eliquis 2.5 mg twice daily. -No medication changes today      Essential hypertension - Primary    BP remains well-controlled on her current antihypertensive regimen. -No medication changes today      Barrett's esophagus without dysplasia    Symptoms remain well-controlled with Protonix 40 mg daily. -No medication changes today       Hyperlipidemia    Lipid panel last updated in June 2023.  She remains on rosuvastatin 10 mg daily. -No medication changes today.  Repeat  lipid panel at follow-up in 6 months       Return in about 6 months (around 06/20/2023) for CPE.    Billie Lade, MD

## 2022-12-19 NOTE — Patient Instructions (Signed)
It was a pleasure to see you today.  Thank you for giving [place] the opportunity to be involved in your care.  Below is a brief recap of your visit and next steps.  We will plan to see you again in 6 months.  Summary No medication changes today Follow up in 6 months for your annual exam and repeat labs

## 2022-12-22 ENCOUNTER — Encounter: Payer: Medicare Other | Admitting: Internal Medicine

## 2022-12-23 ENCOUNTER — Encounter: Payer: Self-pay | Admitting: Internal Medicine

## 2022-12-23 ENCOUNTER — Ambulatory Visit (INDEPENDENT_AMBULATORY_CARE_PROVIDER_SITE_OTHER): Payer: Medicare Other | Admitting: Internal Medicine

## 2022-12-23 DIAGNOSIS — Z0000 Encounter for general adult medical examination without abnormal findings: Secondary | ICD-10-CM | POA: Diagnosis not present

## 2022-12-23 NOTE — Progress Notes (Signed)
Subjective:  This is a telephone encounter between Nicole Good and Nicole Good on 12/23/2022 for AWV. The visit was conducted with the patient located at home and Nicole Good at [facility]. The patient's identity was confirmed using their DOB and current address. The patient has consented to being evaluated through a telephone encounter and understands the associated risks (an examination cannot be done and the patient may need to come in for an appointment) / benefits (allows the patient to remain at home, decreasing exposure to coronavirus).      Nicole Good is a 82 y.o. female who presents for Medicare Annual (Subsequent) preventive examination.  Review of Systems    Review of Systems  All other systems reviewed and are negative.   Objective:    There were no vitals filed for this visit. There is no height or weight on file to calculate BMI.     12/23/2022    3:53 PM 07/27/2022   11:11 AM 12/09/2021    9:06 AM 05/22/2021    3:47 PM 02/12/2021    3:02 PM 01/15/2021   12:51 PM 09/16/2020    9:44 AM  Advanced Directives  Does Patient Have a Medical Advance Directive? Yes No Yes Yes Yes Yes Yes  Type of Surveyor, minerals Living will;Healthcare Power of [employer] Power of [place];Living will Living will;Healthcare Power of Attorney Living will;Healthcare Power of Attorney  Does patient want to make changes to medical advance directive?     No - Patient declined No - Patient declined   Copy of Healthcare Power of Attorney in Chart?   No - copy requested No - copy requested No - copy requested No - copy requested     Current Medications (verified) Outpatient Encounter Medications as of 12/23/2022  Medication Sig   Calcium Carb-Cholecalciferol (OYSTER SHELL CALCIUM W/D) 500-5 MG-MCG TABS TAKE 1 TABLET BY MOUTH THREE TIMES A DAY   ELIQUIS 2.5 MG TABS tablet TAKE 1 TABLET BY MOUTH TWICE A DAY   hydrochlorothiazide (HYDRODIURIL) 25 MG tablet  TAKE 1 TABLET (25 MG TOTAL) BY MOUTH DAILY.   losartan (COZAAR) 50 MG tablet TAKE 1 TABLET BY MOUTH EVERY DAY   metoprolol succinate (TOPROL-XL) 25 MG 24 hr tablet Take 0.5 tablets (12.5 mg total) by mouth daily.   Multiple Vitamins-Minerals (MULTIVITAMINS THER. W/MINERALS) TABS Take 1 tablet by mouth every morning.   pantoprazole (PROTONIX) 40 MG tablet TAKE 1 TABLET BY MOUTH EVERY DAY   polyethylene glycol powder (GLYCOLAX/MIRALAX) 17 GM/SCOOP powder Take 17 g by mouth daily as needed.   rosuvastatin (CRESTOR) 10 MG tablet TAKE 1 TABLET BY MOUTH EVERY DAY   No facility-administered encounter medications on file as of 12/23/2022.    Allergies (verified) Patient has no known allergies.   History: Past Medical History:  Diagnosis Date   Arthritis    "maybe in my right knee and hip" (12/03/2015)   DVT (deep venous thrombosis) 12/03/2015   LLE   Heart murmur    "when I was a little girl; gone now"   High cholesterol    Hypertension    Osteoporosis    Prolapsed internal hemorrhoids, grade 3 07/04/2014   Pulmonary emboli ~ 2012   Right arm weakness 10/30/2015   Past Surgical History:  Procedure Laterality Date   ABDOMINAL HYSTERECTOMY  ~ 1979   "partial"   CATARACT EXTRACTION W/ INTRAOCULAR LENS  IMPLANT, BILATERAL Bilateral 2017   DILATION AND CURETTAGE OF UTERUS  TONSILLECTOMY  1950   Family History  Problem Relation Age of Onset   Stroke Mother    Heart disease Mother    Hypertension Maternal Grandmother    Social History   Socioeconomic History   Marital status: Married    Spouse name: Not on file   Number of children: 1   Years of education: MA   Highest education level: Not on file  Occupational History   Occupation: retired    Comment: middle school principle   Tobacco Use   Smoking status: Never   Smokeless tobacco: Never  Building services engineer Use: Never used  Substance and Sexual Activity   Alcohol use: No   Drug use: No   Sexual activity: Never   Other Topics Concern   Not on file  Social History Narrative   Lives with husband-married 33 years       1 son- no grandkids    Right handed       Enjoys: reads, cleaned and organized, piano       Diet: eats all foods groups and tries to balanced diet well-occasionally hot dog and red meat   Caffeine: hot chocolate, green tea   Water: 3 liters      Exercise 55 minutes 5 days of week       Wears seat belt   Smoke detectors at home   Does not use phone while driving   Social Determinants of Health   Financial Resource Strain: Low Risk  (01/15/2021)   Overall Financial Resource Strain (CARDIA)    Difficulty of Paying Living Expenses: Not hard at all  Food Insecurity: No Food Insecurity (01/15/2021)   Hunger Vital Sign    Worried About Running Out of Food in the Last Year: Never true    Ran Out of Food in the Last Year: Never true  Transportation Needs: No Transportation Needs (01/15/2021)   PRAPARE - Administrator, Civil Service (Medical): No    Lack of Transportation (Non-Medical): No  Physical Activity: Sufficiently Active (01/15/2021)   Exercise Vital Sign    Days of Exercise per Week: 4 days    Minutes of Exercise per Session: 40 min  Stress: No Stress Concern Present (01/15/2021)   [employer] of Occupational Health - Occupational Stress Questionnaire    Feeling of Stress : Not at all  Social Connections: Socially Integrated (01/15/2021)   Social Connection and Isolation Panel [NHANES]    Frequency of Communication with Friends and Family: More than three times a week    Frequency of Social Gatherings with Friends and Family: More than three times a week    Attends Religious Services: More than 4 times per year    Active Member of [employer] or Organizations: No    Attends Engineer, structural: 1 to 4 times per year    Marital Status: Married    Tobacco Counseling Counseling given: Not Answered   Clinical Intake:  Pre-visit preparation  completed: Yes  Pain : No/denies pain     Diabetes: No  How often do you need to have someone help you when you read instructions, pamphlets, or other written materials from your doctor or pharmacy?: 1 - Never What is the last grade level you completed in school?: doctorate degree    Activities of Daily Living    12/23/2022    3:55 PM  In your present state of health, do you have any difficulty performing the following activities:  Hearing? 0  Vision? 0  Difficulty concentrating or making decisions? 0  Walking or climbing stairs? 0  Dressing or bathing? 0  Doing errands, shopping? 0    Patient Care Team: Billie Lade, MD as PCP - General (Internal Medicine) Therese Sarah, RN as Oncology Nurse Navigator (Oncology)  Indicate any recent Medical Services you may have received from other than [facility] providers in the past year (date may be approximate).     Assessment:   This is a routine wellness examination for [place].  Hearing/Vision screen No results found.  Dietary issues and exercise activities discussed:     Goals Addressed   None    Depression Screen    12/23/2022    3:55 PM 10/23/2022    1:44 PM 10/07/2022    9:26 AM 09/12/2022   10:33 AM 08/15/2022    9:41 AM 02/13/2022    9:56 AM 12/09/2021    9:07 AM  PHQ 2/9 Scores  PHQ - 2 Score 0 0 0 0 0 0 0  PHQ- 9 Score  Fall Risk    12/23/2022    3:55 PM 10/23/2022    1:44 PM 10/07/2022    9:26 AM 09/12/2022   10:33 AM 08/15/2022    9:40 AM  Fall Risk   Falls in the past year? 0 0 0 0 0  Number falls in past yr:   0 0 0  Injury with Fall?   0 0 0  Risk for fall due to :    No Fall Risks No Fall Risks  Follow up    Falls evaluation completed Falls evaluation completed    FALL RISK PREVENTION PERTAINING TO THE HOME:  Any stairs in or around the home? Yes  If so, are there any without handrails? Yes  Home free of loose throw rugs in walkways, pet beds, electrical cords, etc? Yes  Adequate  lighting in your home to reduce risk of falls? Yes   ASSISTIVE DEVICES UTILIZED TO PREVENT FALLS:  Life alert? No  Use of a cane, walker or w/c? No  Grab bars in the bathroom? No  Shower chair or bench in shower? No  Elevated toilet seat or a handicapped toilet? No     Cognitive Function:        12/23/2022    3:55 PM 12/09/2021    9:09 AM  6CIT Screen  What Year? 0 points 0 points  What month? 0 points 0 points  What time? 0 points 0 points  Count back from 20 0 points 0 points  Months in reverse 2 points 0 points  Repeat phrase 0 points 0 points  Total Score 2 points 0 points    Immunizations Immunization History  Administered Date(s) Administered   Fluad Quad(high Dose 65+) 06/12/2020, 06/20/2022   Influenza, High Dose Seasonal PF 07/10/2017, 06/20/2021   Influenza, Seasonal, Injecte, Preservative Fre 05/30/2010   Influenza,inj,quad, With Preservative 04/08/2017   Influenza-Unspecified 09/21/2008, 06/27/2013, 07/03/2014, 09/08/2016, 07/10/2017, 06/25/2018   Pneumococcal Conjugate PCV 7 05/09/2017   Pneumococcal Conjugate-13 06/27/2013   Pneumococcal Polysaccharide-23 11/17/2006   Pneumococcal-Unspecified 06/27/2013, 05/09/2017   Tdap 05/30/2010   Zoster Recombinat (Shingrix) 03/22/2022, 09/17/2022    TDAP status: Due, Education has been provided regarding the importance of this vaccine. Advised may receive this vaccine at local pharmacy or Health Dept. Aware to provide a copy of the vaccination record if obtained from local pharmacy or Health Dept. Verbalized acceptance and understanding.  Flu  Vaccine status: Up to date  Pneumococcal vaccine status: Up to date  Covid-19 vaccine status: Information provided on how to obtain vaccines.   Qualifies for Shingles Vaccine? Yes   Zostavax completed No   Shingrix Completed?: Yes  Screening Tests Health Maintenance  Topic Date Due   COVID-19 Vaccine (1) Never done   DTaP/Tdap/Td (2 - Td or Tdap) 05/30/2020    INFLUENZA VACCINE  04/09/2023   Medicare Annual Wellness (AWV)  12/23/2023   Pneumonia Vaccine 78+ Years old  Completed   DEXA SCAN  Completed   Zoster Vaccines- Shingrix  Completed   HPV VACCINES  Aged Out    Health Maintenance  Health Maintenance Due  Topic Date Due   COVID-19 Vaccine (1) Never done   DTaP/Tdap/Td (2 - Td or Tdap) 05/30/2020    Colorectal cancer screening: No longer required.   Mammogram status: No longer required due to age.  Bone Density status: Completed 11/29/19. Results reflect: Bone density results: OSTEOPENIA.   Lung Cancer Screening: (Low Dose CT Chest recommended if Age 60-80 years, 30 pack-year currently smoking OR have quit w/in 15years.) does not qualify.    Additional Screening:  Hepatitis C Screening: does not qualify  Vision Screening: Recommended annual ophthalmology exams for early detection of glaucoma and other disorders of the eye. Is the patient up to date with their annual eye exam?  Yes  Who is the provider or what is the name of the office in which the patient attends annual eye exams? Provider in [place] If pt is not established with a provider, would they like to be referred to a provider to establish care? No .   Dental Screening: Recommended annual dental exams for proper oral hygiene  Community Resource Referral / Chronic Care Management: CRR required this visit?  No   CCM required this visit?  No      Plan:     I have personally reviewed and noted the following in the patient's chart:   Medical and social history Use of alcohol, tobacco or illicit drugs  Current medications and supplements including opioid prescriptions. Patient is not currently taking opioid prescriptions. Functional ability and status Nutritional status Physical activity Advanced directives List of other physicians Hospitalizations, surgeries, and ER visits in previous 12 months Vitals Screenings to include cognitive, depression, and  falls Referrals and appointments  In addition, I have reviewed and discussed with patient certain preventive protocols, quality metrics, and best practice recommendations. A written personalized care plan for preventive services as well as general preventive health recommendations were provided to patient.     Nicole Banister, MD   12/23/2022

## 2022-12-23 NOTE — Patient Instructions (Signed)
  Ms. Hannis , Thank you for taking time to come for your Medicare Wellness Visit. I appreciate your ongoing commitment to your health goals. Please review the following plan we discussed and let me know if I can assist you in the future.   These are the goals we discussed: Patient is going to continue staying active .   This is a list of the screening recommended for you and due dates:  Health Maintenance  Topic Date Due   COVID-19 Vaccine (1) Never done   DTaP/Tdap/Td vaccine (2 - Td or Tdap) 05/30/2020   Medicare Annual Wellness Visit  12/10/2022   Flu Shot  04/09/2023   Pneumonia Vaccine  Completed   DEXA scan (bone density measurement)  Completed   Zoster (Shingles) Vaccine  Completed   HPV Vaccine  Aged Out

## 2022-12-25 ENCOUNTER — Encounter: Payer: Self-pay | Admitting: Internal Medicine

## 2022-12-25 NOTE — Assessment & Plan Note (Signed)
She remains on Eliquis 2.5 mg twice daily.  No medication changes today. 

## 2022-12-25 NOTE — Assessment & Plan Note (Signed)
Lipid panel last updated in June 2023.  She remains on rosuvastatin 10 mg daily. -No medication changes today.  Repeat lipid panel at follow-up in 6 months

## 2022-12-25 NOTE — Assessment & Plan Note (Signed)
Symptoms remain well-controlled with Protonix 40 mg daily. -No medication changes today. 

## 2022-12-25 NOTE — Assessment & Plan Note (Signed)
BP remains well-controlled on her current antihypertensive regimen. -No medication changes today

## 2022-12-31 ENCOUNTER — Telehealth: Payer: Self-pay | Admitting: Internal Medicine

## 2022-12-31 NOTE — Telephone Encounter (Signed)
FYI  Patient has received tetanus shot on 12/31/22 at [facility] [address] [place]

## 2022-12-31 NOTE — Telephone Encounter (Signed)
Added to chart

## 2023-01-01 ENCOUNTER — Other Ambulatory Visit: Payer: Self-pay | Admitting: Internal Medicine

## 2023-01-01 DIAGNOSIS — I493 Ventricular premature depolarization: Secondary | ICD-10-CM

## 2023-01-16 ENCOUNTER — Ambulatory Visit: Payer: Medicare Other | Admitting: Internal Medicine

## 2023-01-18 ENCOUNTER — Other Ambulatory Visit: Payer: Self-pay | Admitting: Internal Medicine

## 2023-03-14 ENCOUNTER — Other Ambulatory Visit: Payer: Self-pay | Admitting: Family Medicine

## 2023-03-14 DIAGNOSIS — I10 Essential (primary) hypertension: Secondary | ICD-10-CM

## 2023-04-17 ENCOUNTER — Encounter: Payer: Self-pay | Admitting: Internal Medicine

## 2023-04-17 ENCOUNTER — Ambulatory Visit (INDEPENDENT_AMBULATORY_CARE_PROVIDER_SITE_OTHER): Payer: Medicare Other | Admitting: Internal Medicine

## 2023-04-17 VITALS — BP 126/75 | HR 63 | Ht 65.0 in | Wt 182.6 lb

## 2023-04-17 DIAGNOSIS — N1832 Chronic kidney disease, stage 3b: Secondary | ICD-10-CM | POA: Diagnosis not present

## 2023-04-17 DIAGNOSIS — K5900 Constipation, unspecified: Secondary | ICD-10-CM | POA: Diagnosis not present

## 2023-04-17 DIAGNOSIS — S161XXA Strain of muscle, fascia and tendon at neck level, initial encounter: Secondary | ICD-10-CM | POA: Diagnosis not present

## 2023-04-17 HISTORY — DX: Strain of muscle, fascia and tendon at neck level, initial encounter: S16.1XXA

## 2023-04-17 MED ORDER — CYCLOBENZAPRINE HCL 5 MG PO TABS: 5.0000 mg | ORAL_TABLET | Freq: Three times a day (TID) | ORAL | 1 refills | Status: DC | PRN

## 2023-04-17 MED ORDER — LINACLOTIDE 145 MCG PO CAPS: 145.0000 ug | ORAL_CAPSULE | Freq: Every day | ORAL | 2 refills | Status: DC

## 2023-04-17 NOTE — Assessment & Plan Note (Signed)
Followed by nephrology (Dr. Wolfgang Phoenix).  Reports that she will return to care for repeat labs and follow-up in September.

## 2023-04-17 NOTE — Patient Instructions (Signed)
It was a pleasure to see you today.  Thank you for giving [place] the opportunity to be involved in your care.  Below is a brief recap of your visit and next steps.  We will plan to see you again in 3 months.  Summary Flexeril prescribed for neck strain relief Please see the attached home exercises Linzess prescribed for constipation Follow up in 3 months for annual exam

## 2023-04-17 NOTE — Assessment & Plan Note (Signed)
Her acute concern today is discomfort in the right side of her neck.  There is tenderness palpation and stiffness over the right trapezius.  I have prescribed Flexeril for as needed symptom relief and provided home PT exercises.

## 2023-04-17 NOTE — Assessment & Plan Note (Signed)
Chronic issue that she has previously attempted to manage with MiraLAX and multiple over-the-counter supplements.  Today she requests a prescription for Linzess as this has recently been effective for her sister.  Nicole Good also tried taking a capsule of Linzess and states that it worked well. -Through shared decision making, Linzess 145 mcg daily has been prescribed

## 2023-04-17 NOTE — Progress Notes (Signed)
Acute Office Visit  Subjective:     Patient ID: Nicole Good, female    DOB: 12-31-1940, 82 y.o.   MRN: [id number]  Chief Complaint  Patient presents with   Palpitations    Follow up; wants neck to be checked.   Nicole Good returns to care today with multiple concerns to discuss.  She was last evaluated by me on 4/12.  No medication changes were made at that time.  There have been no acute interval events.  Nicole Good acute concern today is right-sided neck discomfort.  She states that has been present for 3 weeks.  There is no inciting event or trauma at the onset of pain.  She denies numbness, pain, and weakness, in her upper extremities.  Discomfort keeps her awake at night.  She is interested in treatment options.  She additionally requests a prescription for Linzess for treatment of constipation.  Her sister is prescribed Linzess and it works well for her.  Nicole Good recently tried taking Linzess and states that it was very effective.  Review of Systems  Gastrointestinal:  Positive for constipation.  Musculoskeletal:  Positive for neck pain.  All other systems reviewed and are negative.     Objective:    BP 126/75   Pulse 63   Ht 5\' 5"  (1.651 m)   Wt 182 lb 9.6 oz (82.8 kg)   SpO2 96%   BMI 30.39 kg/m  BP Readings from Last 3 Encounters:  04/17/23 126/75  12/19/22 109/61  10/23/22 126/80   Physical Exam Vitals reviewed.  Constitutional:      General: She is not in acute distress.    Appearance: Normal appearance. She is not toxic-appearing.  HENT:     Head: Normocephalic and atraumatic.     Right Ear: External ear normal.     Left Ear: External ear normal.     Nose: Nose normal. No congestion or rhinorrhea.     Mouth/Throat:     Mouth: Mucous membranes are moist.     Pharynx: Oropharynx is clear. No oropharyngeal exudate or posterior oropharyngeal erythema.  Eyes:     General: No scleral icterus.    Extraocular Movements: Extraocular movements intact.      Conjunctiva/sclera: Conjunctivae normal.     Pupils: Pupils are equal, round, and reactive to light.  Cardiovascular:     Rate and Rhythm: Normal rate and regular rhythm.     Pulses: Normal pulses.     Heart sounds: Normal heart sounds. No murmur heard.    No friction rub. No gallop.  Pulmonary:     Effort: Pulmonary effort is normal.     Breath sounds: Normal breath sounds. No wheezing, rhonchi or rales.  Abdominal:     General: Abdomen is flat. Bowel sounds are normal. There is no distension.     Palpations: Abdomen is soft.     Tenderness: There is no abdominal tenderness.  Musculoskeletal:        General: Tenderness (TTP/stiffness over the right trapezius) present. No swelling. Normal range of motion.     Cervical back: Normal range of motion.     Right lower leg: No edema.     Left lower leg: No edema.  Lymphadenopathy:     Cervical: No cervical adenopathy.  Skin:    General: Skin is warm and dry.     Capillary Refill: Capillary refill takes less than 2 seconds.     Coloration: Skin is not jaundiced.  Neurological:  General: No focal deficit present.     Mental Status: She is alert and oriented to person, place, and time.  Psychiatric:        Mood and Affect: Mood normal.        Behavior: Behavior normal.       Assessment & Plan:   Problem List Items Addressed This Visit       Strain of neck muscle    Her acute concern today is discomfort in the right side of her neck.  There is tenderness palpation and stiffness over the right trapezius.  I have prescribed Flexeril for as needed symptom relief and provided home PT exercises.      Stage 3b chronic kidney disease (HCC)    Followed by nephrology (Dr. Wolfgang Phoenix).  Reports that she will return to care for repeat labs and follow-up in September.      Constipation - Primary    Chronic issue that she has previously attempted to manage with MiraLAX and multiple over-the-counter supplements.  Today she requests a  prescription for Linzess as this has recently been effective for her sister.  Ms. Dworshak also tried taking a capsule of Linzess and states that it worked well. -Through shared decision making, Linzess 145 mcg daily has been prescribed       Meds ordered this encounter  Medications   cyclobenzaprine (FLEXERIL) 5 MG tablet    Sig: Take 1 tablet (5 mg total) by mouth 3 (three) times daily as needed for muscle spasms.    Dispense:  30 tablet    Refill:  1   linaclotide (LINZESS) 145 MCG CAPS capsule    Sig: Take 1 capsule (145 mcg total) by mouth daily before breakfast.    Dispense:  30 capsule    Refill:  2    Return in about 3 months (around 07/18/2023) for CPE.  Billie Lade, MD

## 2023-04-18 ENCOUNTER — Other Ambulatory Visit: Payer: Self-pay | Admitting: Internal Medicine

## 2023-04-21 ENCOUNTER — Telehealth: Payer: Self-pay | Admitting: Internal Medicine

## 2023-04-21 DIAGNOSIS — K5904 Chronic idiopathic constipation: Secondary | ICD-10-CM

## 2023-04-21 NOTE — Telephone Encounter (Signed)
Patient called said the linaclotide (LINZESS) 145 MCG CAPS capsule that was prescribe is too expensive to let provider know.

## 2023-04-27 ENCOUNTER — Other Ambulatory Visit: Payer: Self-pay | Admitting: Internal Medicine

## 2023-04-27 DIAGNOSIS — I493 Ventricular premature depolarization: Secondary | ICD-10-CM

## 2023-05-01 MED ORDER — LUBIPROSTONE 24 MCG PO CAPS
24.0000 ug | ORAL_CAPSULE | Freq: Two times a day (BID) | ORAL | 2 refills | Status: DC
Start: 2023-05-01 — End: 2023-06-01

## 2023-05-01 NOTE — Telephone Encounter (Signed)
Her linzess is too expensive. Asking for cheaper alternatives

## 2023-05-01 NOTE — Addendum Note (Signed)
Addended by: Billie Lade on: 05/01/2023 05:34 PM   Modules accepted: Orders

## 2023-05-04 NOTE — Telephone Encounter (Signed)
Patient advised.

## 2023-05-09 ENCOUNTER — Other Ambulatory Visit: Payer: Self-pay | Admitting: Internal Medicine

## 2023-05-09 DIAGNOSIS — Z7901 Long term (current) use of anticoagulants: Secondary | ICD-10-CM

## 2023-05-09 DIAGNOSIS — Z86718 Personal history of other venous thrombosis and embolism: Secondary | ICD-10-CM

## 2023-05-12 ENCOUNTER — Telehealth: Payer: Self-pay | Admitting: Internal Medicine

## 2023-05-12 ENCOUNTER — Other Ambulatory Visit: Payer: Self-pay

## 2023-05-12 DIAGNOSIS — I10 Essential (primary) hypertension: Secondary | ICD-10-CM

## 2023-05-12 MED ORDER — LOSARTAN POTASSIUM 50 MG PO TABS: 50.0000 mg | ORAL_TABLET | Freq: Every day | ORAL | 1 refills | Status: DC

## 2023-05-12 NOTE — Telephone Encounter (Signed)
Refill sent.

## 2023-05-12 NOTE — Telephone Encounter (Signed)
Prescription Request  05/12/2023  LOV: 04/17/2023  What is the name of the medication or equipment? losartan (COZAAR) 50 MG tablet [[id number]]    Have you contacted your pharmacy to request a refill? Yes   Which pharmacy would you like this sent to?  [id number] [place], [place] - [address] [place] [address] Phone: [phone] Fax: [phone]    Patient notified that their request is being sent to the clinical staff for review and that they should receive a response within 2 business days.   Please advise at Mobile [phone] (mobile)

## 2023-05-14 ENCOUNTER — Other Ambulatory Visit: Payer: Self-pay

## 2023-05-14 MED ORDER — HYDROCHLOROTHIAZIDE 25 MG PO TABS: 25.0000 mg | ORAL_TABLET | Freq: Every day | ORAL | 3 refills | Status: DC

## 2023-05-20 ENCOUNTER — Other Ambulatory Visit: Payer: Self-pay | Admitting: Internal Medicine

## 2023-05-20 ENCOUNTER — Other Ambulatory Visit: Payer: Self-pay

## 2023-05-20 ENCOUNTER — Telehealth: Payer: Self-pay | Admitting: Internal Medicine

## 2023-05-20 DIAGNOSIS — S161XXA Strain of muscle, fascia and tendon at neck level, initial encounter: Secondary | ICD-10-CM

## 2023-05-20 DIAGNOSIS — I493 Ventricular premature depolarization: Secondary | ICD-10-CM

## 2023-05-20 NOTE — Telephone Encounter (Signed)
Lmtrc. Placed orders for PT.

## 2023-05-20 NOTE — Telephone Encounter (Signed)
Patient called in regard to BP medications  Wants to know which she should be taking  hydrochlorothiazide (HYDRODIURIL) 25 MG tablet [[id number]] metoprolol succinate (TOPROL-XL) 25 MG 24 hr tablet [[id number]]  Patient also wants to go ahead with physical therapy  Provider suggested she may need it for neck issues.  Patient wants a call back in regard.

## 2023-05-21 NOTE — Telephone Encounter (Signed)
Spoke with patient.

## 2023-05-31 ENCOUNTER — Other Ambulatory Visit: Payer: Self-pay | Admitting: Internal Medicine

## 2023-05-31 DIAGNOSIS — K5904 Chronic idiopathic constipation: Secondary | ICD-10-CM

## 2023-06-01 ENCOUNTER — Other Ambulatory Visit: Payer: Self-pay | Admitting: Internal Medicine

## 2023-06-01 DIAGNOSIS — K5904 Chronic idiopathic constipation: Secondary | ICD-10-CM

## 2023-06-01 MED ORDER — LUBIPROSTONE 24 MCG PO CAPS
24.0000 ug | ORAL_CAPSULE | Freq: Two times a day (BID) | ORAL | 1 refills | Status: DC
Start: 2023-06-01 — End: 2024-07-05

## 2023-06-05 ENCOUNTER — Ambulatory Visit (HOSPITAL_COMMUNITY): Payer: Medicare Other

## 2023-06-12 ENCOUNTER — Other Ambulatory Visit: Payer: Self-pay | Admitting: Internal Medicine

## 2023-06-12 ENCOUNTER — Other Ambulatory Visit: Payer: Self-pay

## 2023-06-12 ENCOUNTER — Ambulatory Visit (HOSPITAL_COMMUNITY): Payer: Medicare Other | Attending: Internal Medicine

## 2023-06-12 DIAGNOSIS — M6281 Muscle weakness (generalized): Secondary | ICD-10-CM

## 2023-06-12 DIAGNOSIS — M542 Cervicalgia: Secondary | ICD-10-CM

## 2023-06-12 DIAGNOSIS — S161XXA Strain of muscle, fascia and tendon at neck level, initial encounter: Secondary | ICD-10-CM | POA: Insufficient documentation

## 2023-06-12 DIAGNOSIS — R293 Abnormal posture: Secondary | ICD-10-CM

## 2023-06-12 DIAGNOSIS — M810 Age-related osteoporosis without current pathological fracture: Secondary | ICD-10-CM

## 2023-06-12 NOTE — Therapy (Signed)
[facility] OUTPATIENT PHYSICAL THERAPY UPPER EXTREMITY EVALUATION   Patient Name: Nicole Good MRN: [id number] DOB:1941/03/12, 82 y.o., female Today's Date: 06/12/2023  END OF SESSION:   PT End of Session - 06/12/23 1150     Visit Number 1    Number of Visits 10    Authorization Type Medicare/ BCBC Comm    Progress Note Due on Visit 10    PT Start Time 1145    PT Stop Time 1225    PT Time Calculation (min) 40 min             Past Medical History:  Diagnosis Date   Arthritis    "maybe in my right knee and hip" (12/03/2015)   DVT (deep venous thrombosis) (HCC) 12/03/2015   LLE   Heart murmur    "when I was a little girl; gone now"   High cholesterol    Hypertension    Osteoporosis    Prolapsed internal hemorrhoids, grade 3 07/04/2014   Pulmonary emboli (HCC) ~ 2012   Right arm weakness 10/30/2015   Past Surgical History:  Procedure Laterality Date   ABDOMINAL HYSTERECTOMY  ~ 1979   "partial"   CATARACT EXTRACTION W/ INTRAOCULAR LENS  IMPLANT, BILATERAL Bilateral 2017   DILATION AND CURETTAGE OF UTERUS     TONSILLECTOMY  1950   Patient Active Problem List   Diagnosis Date Noted   Strain of neck muscle 04/17/2023   Heart palpitations 10/07/2022   Strain of sternocleidomastoid muscle 09/17/2022   Constipation 02/13/2022   Obesity (BMI 30-39.9) 02/13/2022   Hyperlipidemia 10/04/2021   MGUS (monoclonal gammopathy of unknown significance) 01/15/2021   Abnormal EKG 02/22/2020   Ventricular premature beats 02/22/2020   Current mild episode of major depressive disorder without prior episode (HCC) 02/22/2020   Prediabetes 11/24/2019   Post-menopausal 11/24/2019   Fatigue 08/23/2019   Stage 3b chronic kidney disease (HCC) 07/29/2019   Barrett's esophagus without dysplasia 07/27/2018   Gastritis 07/27/2018   Sleep apnea 06/02/2018   Dry eye syndrome of both lacrimal glands 12/06/2017   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   Bilateral pseudophakia 12/06/2017   Other  pulmonary embolism without acute cor pulmonale (HCC) 12/04/2015   Anticoagulant long-term use 12/04/2015   Deep venous thrombosis (HCC) 12/03/2015   History of pulmonary embolism 12/03/2015   H/O deep venous thrombosis 12/03/2015   Osteoarthritis, knee 05/06/2012   Body mass index (BMI) 26.0-26.9, adult 02/05/2012   Essential hypertension 03/21/2011   Hypercholesterolemia 03/21/2011   Benign lipomatous neoplasm, unspecified 03/21/2011   Osteoporosis 03/21/2011    PCP: Billie Lade, MDRef Provider (PCP)   REFERRING PROVIDER:   Anabel Halon, MD    REFERRING DIAG: S16.1XXA (ICD-10-CM) - Strain of neck muscle, initial encounter   THERAPY DIAG:  Posture abnormality  Muscle weakness (generalized)  Neck pain  Rationale for Evaluation and Treatment: Rehabilitation  ONSET DATE: 2.5 months  SUBJECTIVE:  SUBJECTIVE STATEMENT: 2.5 months ago, patient states she woke up with neck pain. Patient went to MD 05/20/23 who referred patient to OPPT.   Hand dominance: Right  PERTINENT HISTORY: HTN   PAIN:  Are you having pain? Yes: NPRS scale: 1-2/10 Pain location: mid cervical Pain description: "not pain but catching while moving; soreness "  Aggravating factors: bending head to the right Relieving factors: n/a  PRECAUTIONS: None  RED FLAGS: None   WEIGHT BEARING RESTRICTIONS: No  FALLS:  Has patient fallen in last 6 months? No  LIVING ENVIRONMENT: Lives with: lives with their spouse Lives in: House/apartment Stairs: Yes: External: 3 steps; can reach both Has following equipment at home: None  OCCUPATION: Retired  PLOF: Independent  PATIENT GOALS: to get rid off neck pain   NEXT MD VISIT: TBD   OBJECTIVE:   DIAGNOSTIC FINDINGS:  Not performed   COGNITION: Overall cognitive  status: Within functional limits for tasks assessed     SENSATION: WFL  POSTURE: Increased forward head posture, TH kyphosis   CERVICAL ROM:   Active ROM A/PROM (deg) eval  Flexion WFL  Extension WFL  Right lateral flexion 15%  Left lateral flexion 25%  Right rotation 50%  Left rotation 75%   (Blank rows = not tested)  UPPER EXTREMITY ROM:   Active ROM Right eval Left eval  Shoulder flexion [facility]  Shoulder extension    Shoulder abduction [facility]  Shoulder adduction    Shoulder internal rotation    Shoulder external rotation    Elbow flexion    Elbow extension    Wrist flexion    Wrist extension    Wrist ulnar deviation    Wrist radial deviation    Wrist pronation    Wrist supination    (Blank rows = not tested; * = limited by pain )   UPPER EXTREMITY GRIP STRENGTH:      LEFT Grip(measured in pounds): 40 RIGHT Grip(measured in pounds): 55  UPPER EXTREMITY MMT:   MMT Right eval Left eval  Shoulder flexion 4-/5 3+/5  Shoulder extension    Shoulder abduction 4-/5 3+/5  Shoulder adduction    Shoulder internal rotation    Shoulder external rotation 4-/5 3+/5  Middle trapezius    Lower trapezius    Elbow flexion    Elbow extension    Wrist flexion    Wrist extension    Wrist ulnar deviation    Wrist radial deviation    Wrist pronation    Wrist supination    (Blank rows = not tested; * = limited by pain )   JOINT MOBILITY TESTING:  Hypomobility PA glides to C3C4  PALPATION:  Minimal tenderness to palpation to cervical    TODAY'S TREATMENT:  DATE:   06/12/23 PT Initial Eval Self-Care/ADLS -Activity modification, postural   PATIENT EDUCATION: Education details: HEP Person educated: Patient Education method: Explanation Education comprehension: verbalized understanding  HOME EXERCISE PROGRAM: Access  Code: [email or web address] URL: [email or web address] Date: 06/12/2023 Prepared by: Seymour Bars  Exercises - Seated Scapular Retraction  - 1 x daily - 10 reps - 3 hold - Supine Cervical Sidebending Stretch  - 1 x daily - 10 reps - 3 hold  ASSESSMENT:   CLINICAL IMPRESSION: Patient is a 82 y.o. female who was seen today for physical therapy evaluation and treatment for abnormal posture, neck pain, shoulder weakness. Patient presents to PT with the following objective impairments: decreased ROM, decreased strength, hypomobility, postural dysfunction, and pain. These impairments limit the patient in activities such as carrying, lifting, locomotion level, and caring for others. These impairments also limit the patient in participation such as meal prep, interpersonal relationship, driving, shopping, community activity, and yard work. The patient will benefit from PT to address the limitations/impairments listed below to return to their prior level of function in the domains of activity and participation.   PERSONAL FACTORS:  n/a  are also affecting patient's functional outcome.   REHAB POTENTIAL: Excellent  CLINICAL DECISION MAKING: Stable/uncomplicated  EVALUATION COMPLEXITY: Low  GOALS: Goals reviewed with patient? No  SHORT TERM GOALS: Target date: 2 sessions   Patient will be independent with a basic stretching/strengthening HEP  Baseline: Goal status: INITIAL    LONG TERM GOALS: Target date: 5 sessions  Patient will be independent with a comprehensive strengthening HEP  Baseline:  Goal status: INITIAL  2.   Patient will improve Cervical active range of motion into right lateral flexion, rotation by 10% degrees with 0/10 pain. Baseline:  Goal status: INITIAL   PLAN: PT FREQUENCY:     PT DURATION:  5 sessions  PLANNED INTERVENTIONS: Therapeutic exercises, Therapeutic activity, Neuromuscular re-education, Balance training, Gait training, Patient/Family education,  Self Care, Joint mobilization, Joint manipulation, Dry Needling, Spinal manipulation, Spinal mobilization, Cryotherapy, Moist heat, Traction, and Manual therapy  PLAN FOR NEXT SESSION: Assign basic HEP for postures   Seymour Bars, PT 06/12/2023, 1:06 PM

## 2023-06-18 ENCOUNTER — Ambulatory Visit (HOSPITAL_COMMUNITY): Payer: Medicare Other

## 2023-06-18 DIAGNOSIS — M6281 Muscle weakness (generalized): Secondary | ICD-10-CM

## 2023-06-18 DIAGNOSIS — M542 Cervicalgia: Secondary | ICD-10-CM

## 2023-06-18 DIAGNOSIS — R293 Abnormal posture: Secondary | ICD-10-CM

## 2023-06-18 NOTE — Therapy (Signed)
[facility] OUTPATIENT PHYSICAL THERAPY TREATMENT   Patient Name: ARBADELLA WAAG MRN: [id number] DOB:1941-01-25, 82 y.o., female Today's Date: 06/18/2023  END OF SESSION:   PT End of Session - 06/18/23 1431     Visit Number 2    Number of Visits 10    Date for PT Re-Evaluation 07/17/23    Authorization Type Medicare/ BCBC Comm    Authorization Time Period 06/12/23-07/17/23    Progress Note Due on Visit 10    PT Start Time 1430    PT Stop Time 1510    PT Time Calculation (min) 40 min    Activity Tolerance Patient tolerated treatment well;No increased pain    Behavior During Therapy WFL for tasks assessed/performed             Past Medical History:  Diagnosis Date   Arthritis    "maybe in my right knee and hip" (12/03/2015)   DVT (deep venous thrombosis) (HCC) 12/03/2015   LLE   Heart murmur    "when I was a little girl; gone now"   High cholesterol    Hypertension    Osteoporosis    Prolapsed internal hemorrhoids, grade 3 07/04/2014   Pulmonary emboli (HCC) ~ 2012   Right arm weakness 10/30/2015   Past Surgical History:  Procedure Laterality Date   ABDOMINAL HYSTERECTOMY  ~ 1979   "partial"   CATARACT EXTRACTION W/ INTRAOCULAR LENS  IMPLANT, BILATERAL Bilateral 2017   DILATION AND CURETTAGE OF UTERUS     TONSILLECTOMY  1950   Patient Active Problem List   Diagnosis Date Noted   Strain of neck muscle 04/17/2023   Heart palpitations 10/07/2022   Strain of sternocleidomastoid muscle 09/17/2022   Constipation 02/13/2022   Obesity (BMI 30-39.9) 02/13/2022   Hyperlipidemia 10/04/2021   MGUS (monoclonal gammopathy of unknown significance) 01/15/2021   Abnormal EKG 02/22/2020   Ventricular premature beats 02/22/2020   Current mild episode of major depressive disorder without prior episode (HCC) 02/22/2020   Prediabetes 11/24/2019   Post-menopausal 11/24/2019   Fatigue 08/23/2019   Stage 3b chronic kidney disease (HCC) 07/29/2019   Barrett's esophagus without dysplasia  07/27/2018   Gastritis 07/27/2018   Sleep apnea 06/02/2018   Dry eye syndrome of both lacrimal glands 12/06/2017   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   Bilateral pseudophakia 12/06/2017   Other pulmonary embolism without acute cor pulmonale (HCC) 12/04/2015   Anticoagulant long-term use 12/04/2015   Deep venous thrombosis (HCC) 12/03/2015   History of pulmonary embolism 12/03/2015   H/O deep venous thrombosis 12/03/2015   Osteoarthritis, knee 05/06/2012   Body mass index (BMI) 26.0-26.9, adult 02/05/2012   Essential hypertension 03/21/2011   Hypercholesterolemia 03/21/2011   Benign lipomatous neoplasm, unspecified 03/21/2011   Osteoporosis 03/21/2011    PCP: Billie Lade, MDRef Provider (PCP)   REFERRING PROVIDER:   Anabel Halon, MD    REFERRING DIAG: S16.1XXA (ICD-10-CM) - Strain of neck muscle, initial encounter   THERAPY DIAG:  Posture abnormality  Muscle weakness (generalized)  Neck pain  Rationale for Evaluation and Treatment: Rehabilitation  ONSET DATE: July/August 2024  SUBJECTIVE:  SUBJECTIVE STATEMENT: Pt has been busy helping her sister with medical appointments. She has done her HEP twice with success. No medical or medication updates.    PERTINENT HISTORY: Ajanae Fuente is an 82yoF who is referred for nonspecific neck pain. Pt reports 2.5 months ago, patient states she woke up with neck pain. Patient went to MD 05/20/23 who referred patient to OPPT. PMH: HTN  PAIN:  Are you having pain? No, not pain, just feels a little tight and catchy when rotating head to left side    PRECAUTIONS: None   WEIGHT BEARING RESTRICTIONS: No  FALLS:  Has patient fallen in last 6 months? No  PATIENT GOALS: to get rid off neck pain   Objective:     TODAY'S TREATMENT:                                                                                                                                          DATE: 06/18/23   -Moist hot pack applied in hooklying x4 minutes  -Gentle sustained release MFR to Left upper trapezius x 10 minutes  *excellent reduction of taut bands, most within 1 minute -Left upper trap pin and stretch paired with 1. Cervical Rt rotation x15, then Rt shoulder extension x15 (from 90 degrees flexion)   -cervical retraction into 2 pillows 15x3secH  -BUE wand flexion (without wand) x15 (thoracic extension AA/ROM) supine -supine isometric bilat shoulder extension+scap retraction into plinth 1x15x3secH  -cervical retraction into 2 pillows 15x3secH   Transiition to sitting edge of plinth   -seated cervical rotation ROM 1x10 bilat -standing cervical extension ROM 1x15 c BUE support for balance.       PATIENT EDUCATION: Education details: HEP Person educated: Patient Education method: Explanation Education comprehension: verbalized understanding  HOME EXERCISE PROGRAM: Access Code: [email or web address] URL: [email or web address] Date: 06/18/2023 Prepared by: Alvera Novel  Exercises - Seated Scapular Retraction  - 1 x daily - 10 reps - 3 hold - Supine Cervical Sidebending Stretch  - 1 x daily - 10 reps - 3 hold - Supine Cervical Retraction with Towel  - 1 x daily - 10 reps - 3sec hold  ASSESSMENT:   CLINICAL IMPRESSION: Initiated a treatment course today, largely aimed at reducing adaptive shortening and taut band in the cervical musculature. Pain is generally low on arrival, as well as exit. No HEP updates made at this time. The patient will benefit from PT to address the limitations/impairments listed below to return to their prior level of function in the domains of activity and participation.   PERSONAL FACTORS:  n/a  are also affecting patient's functional outcome.   REHAB POTENTIAL: Excellent  CLINICAL DECISION  MAKING: Stable/uncomplicated  EVALUATION COMPLEXITY: Low  GOALS: Goals reviewed with patient? No  SHORT TERM GOALS: Target date: 2 sessions   Patient will be independent with a basic stretching/strengthening HEP  Baseline: Goal status: INITIAL  LONG TERM GOALS: Target date: 5 sessions  Patient will be independent with a comprehensive strengthening HEP  Baseline:  Goal status: INITIAL  2.   Patient will improve Cervical active range of motion into right lateral flexion, rotation by 10% degrees with 0/10 pain. Baseline:  Goal status: INITIAL   PLAN: PT FREQUENCY:     PT DURATION:  5 sessions  PLANNED INTERVENTIONS: Therapeutic exercises, Therapeutic activity, Neuromuscular re-education, Balance training, Gait training, Patient/Family education, Self Care, Joint mobilization, Joint manipulation, Dry Needling, Spinal manipulation, Spinal mobilization, Cryotherapy, Moist heat, Traction, and Manual therapy  PLAN FOR NEXT SESSION:    Tajuanna Burnett C, PT 06/18/2023, 2:39 PM   Rosamaria Lints, PT Physical Therapist [facility] [place]  [phone]

## 2023-06-24 ENCOUNTER — Ambulatory Visit (HOSPITAL_COMMUNITY): Payer: Medicare Other

## 2023-06-24 DIAGNOSIS — M6281 Muscle weakness (generalized): Secondary | ICD-10-CM

## 2023-06-24 DIAGNOSIS — R293 Abnormal posture: Secondary | ICD-10-CM | POA: Diagnosis not present

## 2023-06-24 DIAGNOSIS — M542 Cervicalgia: Secondary | ICD-10-CM

## 2023-06-24 NOTE — Therapy (Signed)
[facility] OUTPATIENT PHYSICAL THERAPY TREATMENT   Patient Name: Nicole Good MRN: [id number] DOB:08-24-1941, 82 y.o., female Today's Date: 06/24/2023  END OF SESSION:   PT End of Session - 06/24/23 1151     Visit Number 3    Number of Visits 10    Date for PT Re-Evaluation 07/17/23    Authorization Type Medicare/ BCBC Comm    Authorization Time Period 06/12/23-07/17/23    Progress Note Due on Visit 10    PT Start Time 1148    PT Stop Time 1228    PT Time Calculation (min) 40 min    Activity Tolerance Patient tolerated treatment well    Behavior During Therapy WFL for tasks assessed/performed             Past Medical History:  Diagnosis Date   Arthritis    "maybe in my right knee and hip" (12/03/2015)   DVT (deep venous thrombosis) (HCC) 12/03/2015   LLE   Heart murmur    "when I was a little girl; gone now"   High cholesterol    Hypertension    Osteoporosis    Prolapsed internal hemorrhoids, grade 3 07/04/2014   Pulmonary emboli (HCC) ~ 2012   Right arm weakness 10/30/2015   Past Surgical History:  Procedure Laterality Date   ABDOMINAL HYSTERECTOMY  ~ 1979   "partial"   CATARACT EXTRACTION W/ INTRAOCULAR LENS  IMPLANT, BILATERAL Bilateral 2017   DILATION AND CURETTAGE OF UTERUS     TONSILLECTOMY  1950   Patient Active Problem List   Diagnosis Date Noted   Strain of neck muscle 04/17/2023   Heart palpitations 10/07/2022   Strain of sternocleidomastoid muscle 09/17/2022   Constipation 02/13/2022   Obesity (BMI 30-39.9) 02/13/2022   Hyperlipidemia 10/04/2021   MGUS (monoclonal gammopathy of unknown significance) 01/15/2021   Abnormal EKG 02/22/2020   Ventricular premature beats 02/22/2020   Current mild episode of major depressive disorder without prior episode (HCC) 02/22/2020   Prediabetes 11/24/2019   Post-menopausal 11/24/2019   Fatigue 08/23/2019   Stage 3b chronic kidney disease (HCC) 07/29/2019   Barrett's esophagus without dysplasia 07/27/2018    Gastritis 07/27/2018   Sleep apnea 06/02/2018   Dry eye syndrome of both lacrimal glands 12/06/2017   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   Bilateral pseudophakia 12/06/2017   Other pulmonary embolism without acute cor pulmonale (HCC) 12/04/2015   Anticoagulant long-term use 12/04/2015   Deep venous thrombosis (HCC) 12/03/2015   History of pulmonary embolism 12/03/2015   H/O deep venous thrombosis 12/03/2015   Osteoarthritis, knee 05/06/2012   Body mass index (BMI) 26.0-26.9, adult 02/05/2012   Essential hypertension 03/21/2011   Hypercholesterolemia 03/21/2011   Benign lipomatous neoplasm, unspecified 03/21/2011   Osteoporosis 03/21/2011    PCP: Billie Lade, MDRef Provider (PCP)   REFERRING PROVIDER:   Anabel Halon, MD    REFERRING DIAG: S16.1XXA (ICD-10-CM) - Strain of neck muscle, initial encounter   THERAPY DIAG:  Posture abnormality  Muscle weakness (generalized)  Neck pain  Rationale for Evaluation and Treatment: Rehabilitation  ONSET DATE: July/August 2024  SUBJECTIVE:  SUBJECTIVE STATEMENT: Pt has been busy helping her sister with medical appointments. She has done her HEP once time since with success. Pain remains improved since prior session.     PERTINENT HISTORY: Nicole Good is an 82yoF who is referred for nonspecific neck pain. Pt reports 2.5 months ago, patient states she woke up with neck pain. Patient went to MD 05/20/23 who referred patient to OPPT. PMH: HTN  PAIN:  Are you having pain? No  PRECAUTIONS: None   WEIGHT BEARING RESTRICTIONS: No  FALLS:  Has patient fallen in last 6 months? No  PATIENT GOALS: to get rid off neck pain   Objective:     TODAY'S TREATMENT:                                                                                                                                          DATE: 06/24/23 -seated UBE, level 1.5 x4 minutes, alternating direction Q60sec -seated cervical rotation ROM (continues to feel pain free and unrestricted since prior visit)  -screening for scapular protraction restriction in UT bilat (none identified)  -cervical retraction into 2 pillows 15x3secH  -sustained pincher MFR left upper trapezius near [facility] joint x75sec, then pin and stretch paired with Rt Crvical rotation P/ROM x10  -cervical traction towel roll x 2 minutes  -standing lateral raises BUE 2lb FW 1x12  -standing front raises BUE 2lb FW open can position 1x12  -standing scapular elevation c bilat 4lb FW x10 (cues for 50% ROM to reduce potential cramping)  -seated horizontal row on cable machine L2 x15   -standing lateral raises BUE 2lb FW 1x12  -standing front raises BUE 2lb FW open can position 1x10  -standing scapular elevation c bilat 4lb FW x10 (cues for 50% ROM to reduce potential cramping)   06/18/23 -Moist hot pack applied in hooklying x4 minutes  -Gentle sustained release MFR to Left upper trapezius x 10 minutes  *excellent reduction of taut bands, most within 1 minute -Left upper trap pin and stretch paired with 1. Cervical Rt rotation x15, then Rt shoulder extension x15 (from 90 degrees flexion)   -cervical retraction into 2 pillows 15x3secH  -BUE wand flexion (without wand) x15 (thoracic extension AA/ROM) supine -supine isometric bilat shoulder extension+scap retraction into plinth 1x15x3secH  -cervical retraction into 2 pillows 15x3secH   Transiition to sitting edge of plinth   -seated cervical rotation ROM 1x10 bilat -standing cervical extension ROM 1x15 c BUE support for balance.     PATIENT EDUCATION: Education details: HEP Person educated: Patient Education method: Explanation Education comprehension: verbalized understanding  HOME EXERCISE PROGRAM: Access Code: [email or web address] URL:  [email or web address] Date: 06/18/2023 Prepared by: Alvera Novel  Exercises - Seated Scapular Retraction  - 1 x daily - 10 reps - 3 hold - Supine Cervical Sidebending Stretch  - 1 x daily - 10 reps - 3 hold - Supine Cervical Retraction with Towel  -  1 x daily - 10 reps - 3sec hold  ASSESSMENT:   CLINICAL IMPRESSION: Continued with targeting taut bands in the cervical musculature, then ROM work, and low to moderate tissue loading to improve long-term load tolerance. No pain in session today. The patient will benefit from PT to address the limitations/impairments listed below to return to their prior level of function in the domains of activity and participation.   PERSONAL FACTORS:  n/a  are also affecting patient's functional outcome.   REHAB POTENTIAL: Excellent  CLINICAL DECISION MAKING: Stable/uncomplicated  EVALUATION COMPLEXITY: Low  GOALS: Goals reviewed with patient? No  SHORT TERM GOALS: Target date: 2 sessions   Patient will be independent with a basic stretching/strengthening HEP  Baseline: Goal status: MET   LONG TERM GOALS: Target date: 5 sessions  Patient will be independent with a comprehensive strengthening HEP  Baseline:  Goal status: INITIAL  2.   Patient will improve Cervical active range of motion into right lateral flexion, rotation by 10% degrees with 0/10 pain. Baseline:  Goal status: INITIAL   PLAN: PT FREQUENCY:     PT DURATION:  5 sessions  PLANNED INTERVENTIONS: Therapeutic exercises, Therapeutic activity, Neuromuscular re-education, Balance training, Gait training, Patient/Family education, Self Care, Joint mobilization, Joint manipulation, Dry Needling, Spinal manipulation, Spinal mobilization, Cryotherapy, Moist heat, Traction, and Manual therapy  PLAN FOR NEXT SESSION:  If still feeling good, update HEP for long term shoulder loading program and DC.   Zakhai Meisinger C, PT 06/24/2023, 11:54 AM   Rosamaria Lints,  PT Physical Therapist [facility] [place]  [phone]

## 2023-07-01 ENCOUNTER — Encounter (HOSPITAL_COMMUNITY): Payer: Medicare Other

## 2023-07-10 ENCOUNTER — Ambulatory Visit (HOSPITAL_COMMUNITY): Payer: Medicare Other | Attending: Internal Medicine

## 2023-07-10 DIAGNOSIS — M542 Cervicalgia: Secondary | ICD-10-CM | POA: Insufficient documentation

## 2023-07-10 DIAGNOSIS — R293 Abnormal posture: Secondary | ICD-10-CM | POA: Diagnosis present

## 2023-07-10 DIAGNOSIS — M6281 Muscle weakness (generalized): Secondary | ICD-10-CM | POA: Diagnosis present

## 2023-07-10 NOTE — Therapy (Signed)
[facility] OUTPATIENT PHYSICAL THERAPY TREATMENT   Patient Name: Nicole Good MRN: [id number] DOB:[birth year], 82 y.o., female Today's Date: 07/10/2023 .PHYSICAL THERAPY DISCHARGE SUMMARY  Visits from Start of Care: 4  Current functional level related to goals / functional outcomes: See below   Remaining deficits: See below   Education / Equipment: N/a   Patient agrees to discharge. Patient goals were partially met. Patient is being discharged due to being pleased with the current functional level.   END OF SESSION:   PT End of Session - 07/10/23 1148     Visit Number 4    Number of Visits 10    Date for PT Re-Evaluation 07/17/23    Authorization Type Medicare/ BCBC Comm    Authorization Time Period 06/12/23-07/17/23    Progress Note Due on Visit 10    PT Start Time 1149    PT Stop Time 1227    PT Time Calculation (min) 38 min    Activity Tolerance Patient tolerated treatment well    Behavior During Therapy WFL for tasks assessed/performed             Past Medical History:  Diagnosis Date   Arthritis    "maybe in my right knee and hip" (12/03/2015)   DVT (deep venous thrombosis) (HCC) 12/03/2015   LLE   Heart murmur    "when I was a little girl; gone now"   High cholesterol    Hypertension    Osteoporosis    Prolapsed internal hemorrhoids, grade 3 07/04/2014   Pulmonary emboli (HCC) ~ 2012   Right arm weakness 10/30/2015   Past Surgical History:  Procedure Laterality Date   ABDOMINAL HYSTERECTOMY  ~ 1979   "partial"   CATARACT EXTRACTION W/ INTRAOCULAR LENS  IMPLANT, BILATERAL Bilateral 2017   DILATION AND CURETTAGE OF UTERUS     TONSILLECTOMY  1950   Patient Active Problem List   Diagnosis Date Noted   Strain of neck muscle 04/17/2023   Heart palpitations 10/07/2022   Strain of sternocleidomastoid muscle 09/17/2022   Constipation 02/13/2022   Obesity (BMI 30-39.9) 02/13/2022   Hyperlipidemia 10/04/2021   MGUS (monoclonal gammopathy of unknown  significance) 01/15/2021   Abnormal EKG 02/22/2020   Ventricular premature beats 02/22/2020   Current mild episode of major depressive disorder without prior episode (HCC) 02/22/2020   Prediabetes 11/24/2019   Post-menopausal 11/24/2019   Fatigue 08/23/2019   Stage 3b chronic kidney disease (HCC) 07/29/2019   Barrett's esophagus without dysplasia 07/27/2018   Gastritis 07/27/2018   Sleep apnea 06/02/2018   Dry eye syndrome of both lacrimal glands 12/06/2017   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   Bilateral pseudophakia 12/06/2017   Other pulmonary embolism without acute cor pulmonale (HCC) 12/04/2015   Anticoagulant long-term use 12/04/2015   Deep venous thrombosis (HCC) 12/03/2015   History of pulmonary embolism 12/03/2015   H/O deep venous thrombosis 12/03/2015   Osteoarthritis, knee 05/06/2012   Body mass index (BMI) 26.0-26.9, adult 02/05/2012   Essential hypertension 03/21/2011   Hypercholesterolemia 03/21/2011   Benign lipomatous neoplasm, unspecified 03/21/2011   Osteoporosis 03/21/2011    PCP: Billie Lade, MDRef Provider (PCP)   REFERRING PROVIDER:   Anabel Halon, MD    REFERRING DIAG: S16.1XXA (ICD-10-CM) - Strain of neck muscle, initial encounter   THERAPY DIAG:  Posture abnormality  Muscle weakness (generalized)  Neck pain  Rationale for Evaluation and Treatment: Rehabilitation  ONSET DATE: July/August 2024  SUBJECTIVE:  SUBJECTIVE STATEMENT: Pt has been busy helping her sister with medical appointments. She has done her HEP intermittently. Patient states she is pleased with progress thus far and would like to continue with HEP complaince at home and eventually return to Yoga.   PERTINENT HISTORY: Nicole Good is an 82yoF who is referred for nonspecific neck  pain. Pt reports 2.5 months ago, patient states she woke up with neck pain. Patient went to MD 05/20/23 who referred patient to OPPT. PMH: HTN  PAIN:  Are you having pain? No  PRECAUTIONS: None   WEIGHT BEARING RESTRICTIONS: No  FALLS:  Has patient fallen in last 6 months? No  PATIENT GOALS: to get rid off neck pain   Objective:  CERVICAL ROM:   Active ROM A/PROM (deg) eval 07/10/23  Flexion WFL WFL  Extension [facility] WFL  Right lateral flexion 15% 20%  Left lateral flexion 25% 35%  Right rotation 50% 75%  Left rotation 75% 75%   (Blank rows = not tested)  UPPER EXTREMITY ROM:   Active ROM Right eval Left eval  Shoulder flexion [facility]  Shoulder extension    Shoulder abduction [facility]  Shoulder adduction    Shoulder internal rotation    Shoulder external rotation    Elbow flexion    Elbow extension    Wrist flexion    Wrist extension    Wrist ulnar deviation    Wrist radial deviation    Wrist pronation    Wrist supination    (Blank rows = not tested; * = limited by pain )   UPPER EXTREMITY GRIP STRENGTH:      LEFT Grip(measured in pounds): 40, 42 on 07/10/23 RIGHT Grip(measured in pounds): 55, 55 on 07/10/23  UPPER EXTREMITY MMT:   MMT Right eval Left eval  Shoulder flexion 4-/5 3+/5  Shoulder extension    Shoulder abduction 4-/5 3+/5  Shoulder adduction    Shoulder internal rotation    Shoulder external rotation 4-/5 3+/5  Middle trapezius    Lower trapezius    Elbow flexion    Elbow extension    Wrist flexion    Wrist extension    Wrist ulnar deviation    Wrist radial deviation    Wrist pronation    Wrist supination    (Blank rows = not tested; * = limited by pain )      TODAY'S TREATMENT:                                                                                                                                         DATE:   07/10/23 PT Discharge HEP review    06/18/23 -seated UBE, level 1.5 x4 minutes, alternating direction  Q60sec -seated cervical rotation ROM (continues to feel pain free and unrestricted since prior visit)  -screening for scapular protraction restriction in UT bilat (none identified)  -cervical retraction into 2 pillows  15x3secH  -sustained pincher MFR left upper trapezius near [facility] joint x75sec, then pin and stretch paired with Rt Crvical rotation P/ROM x10  -cervical traction towel roll x 2 minutes  -standing lateral raises BUE 2lb FW 1x12  -standing front raises BUE 2lb FW open can position 1x12  -standing scapular elevation c bilat 4lb FW x10 (cues for 50% ROM to reduce potential cramping)  -seated horizontal row on cable machine L2 x15   -standing lateral raises BUE 2lb FW 1x12  -standing front raises BUE 2lb FW open can position 1x10  -standing scapular elevation c bilat 4lb FW x10 (cues for 50% ROM to reduce potential cramping)   06/18/23 -Moist hot pack applied in hooklying x4 minutes  -Gentle sustained release MFR to Left upper trapezius x 10 minutes  *excellent reduction of taut bands, most within 1 minute -Left upper trap pin and stretch paired with 1. Cervical Rt rotation x15, then Rt shoulder extension x15 (from 90 degrees flexion)   -cervical retraction into 2 pillows 15x3secH  -BUE wand flexion (without wand) x15 (thoracic extension AA/ROM) supine -supine isometric bilat shoulder extension+scap retraction into plinth 1x15x3secH  -cervical retraction into 2 pillows 15x3secH   Transiition to sitting edge of plinth   -seated cervical rotation ROM 1x10 bilat -standing cervical extension ROM 1x15 c BUE support for balance.     PATIENT EDUCATION: Education details: HEP Person educated: Patient Education method: Explanation Education comprehension: verbalized understanding  HOME EXERCISE PROGRAM: Access Code: [email or web address] URL: [email or web address] Date: 07/10/2023 Prepared by: Seymour Bars  Exercises - Static Prone on Elbows  - 1 x daily - 7 x weekly -  1 minute hold - Diaphragmatic Breathing in Child's Pose with Pelvic Floor Relaxation  - 1 x daily - 10 reps - 3 secs hold - Supine Cervical Sidebending Stretch  - 1 x daily - 10 reps - 3 hold - Supine Cervical Retraction with Towel  - 1 x daily - 10 reps - 3sec hold - Seated Thoracic Lumbar Extension  - 1 x daily - 10 reps - 3 seconds hold  ASSESSMENT:   CLINICAL IMPRESSION: As per subjective, patient would like to continue with HEP at home. Patient has shown improved pain levels and cervical active range of motion since starting PT. PT reviewed/ modified HEP; patient able to demonstrate well with no pain. PT discharge to HEP   PERSONAL FACTORS:  n/a  are also affecting patient's functional outcome.   REHAB POTENTIAL: Excellent  CLINICAL DECISION MAKING: Stable/uncomplicated  EVALUATION COMPLEXITY: Low  GOALS: Goals reviewed with patient? No  SHORT TERM GOALS: Target date: 2 sessions   Patient will be independent with a basic stretching/strengthening HEP  Baseline: Goal status: MET   LONG TERM GOALS: Target date: 5 sessions  Patient will be independent with a comprehensive strengthening HEP  Baseline:  Goal status: MET  2.   Patient will improve Cervical active range of motion into right lateral flexion, rotation by 10% degrees with 0/10 pain. Baseline:  Goal status: MET   PLAN: PT FREQUENCY:     PT DURATION:  5 sessions  PLANNED INTERVENTIONS: Therapeutic exercises, Therapeutic activity, Neuromuscular re-education, Balance training, Gait training, Patient/Family education, Self Care, Joint mobilization, Joint manipulation, Dry Needling, Spinal manipulation, Spinal mobilization, Cryotherapy, Moist heat, Traction, and Manual therapy     Seymour Bars, PT 07/10/2023, 11:51 AM

## 2023-07-20 ENCOUNTER — Ambulatory Visit (INDEPENDENT_AMBULATORY_CARE_PROVIDER_SITE_OTHER): Payer: Medicare Other | Admitting: Internal Medicine

## 2023-07-20 ENCOUNTER — Encounter: Payer: Self-pay | Admitting: Internal Medicine

## 2023-07-20 VITALS — BP 135/82 | HR 61 | Resp 16 | Ht 65.0 in | Wt 183.0 lb

## 2023-07-20 DIAGNOSIS — R7303 Prediabetes: Secondary | ICD-10-CM | POA: Diagnosis not present

## 2023-07-20 DIAGNOSIS — K2270 Barrett's esophagus without dysplasia: Secondary | ICD-10-CM | POA: Diagnosis not present

## 2023-07-20 DIAGNOSIS — I10 Essential (primary) hypertension: Secondary | ICD-10-CM

## 2023-07-20 DIAGNOSIS — E782 Mixed hyperlipidemia: Secondary | ICD-10-CM

## 2023-07-20 DIAGNOSIS — I824Y9 Acute embolism and thrombosis of unspecified deep veins of unspecified proximal lower extremity: Secondary | ICD-10-CM

## 2023-07-20 NOTE — Assessment & Plan Note (Signed)
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  She is prescribed Eliquis 2.5 mg twice daily and will remain on anticoagulation indefinitely.

## 2023-07-20 NOTE — Patient Instructions (Signed)
It was a pleasure to see you today.  Thank you for giving [place] the opportunity to be involved in your care.  Below is a brief recap of your visit and next steps.  We will plan to see you again in 6 months.  Summary No medication changes today Repeat labs ordered Follow up in 6 months

## 2023-07-20 NOTE — Assessment & Plan Note (Signed)
A1c 6.1 on labs from January 2023.  Repeat A1c ordered today.

## 2023-07-20 NOTE — Assessment & Plan Note (Signed)
Lipid panel updated in June 2023.  Total cholesterol 156 and LDL 93.  She is currently prescribed rosuvastatin 10 mg daily.  Repeat lipid panel ordered today.

## 2023-07-20 NOTE — Assessment & Plan Note (Signed)
 Remains adequately controlled on current antihypertensive regimen.  No medication changes are indicated today.

## 2023-07-20 NOTE — Progress Notes (Signed)
Established Patient Office Visit  Subjective   Patient ID: Nicole Good, female    DOB: December 14, 1940  Age: 82 y.o. MRN: [id number]  Chief Complaint  Patient presents with   Follow-up   Nicole Good returns to care today for routine follow-up.  She was last evaluated by me on 8/9 for an acute visit endorsing right-sided neck discomfort and constipation.  Her neck strain was managed conservatively and Linzess started for treatment of constipation.  Previously seen by me for routine follow-up on 4/12.  Nicole Good reports feeling well today.  She is asymptomatic and has no acute concerns to discuss.  Past Medical History:  Diagnosis Date   Arthritis    "maybe in my right knee and hip" (12/03/2015)   DVT (deep venous thrombosis) (HCC) 12/03/2015   LLE   Heart murmur    "when I was a little girl; gone now"   High cholesterol    Hypertension    Osteoporosis    Prolapsed internal hemorrhoids, grade 3 07/04/2014   Pulmonary emboli (HCC) ~ 2012   Right arm weakness 10/30/2015   Past Surgical History:  Procedure Laterality Date   ABDOMINAL HYSTERECTOMY  ~ 1979   "partial"   CATARACT EXTRACTION W/ INTRAOCULAR LENS  IMPLANT, BILATERAL Bilateral 2017   DILATION AND CURETTAGE OF UTERUS     TONSILLECTOMY  1950   Social History   Tobacco Use   Smoking status: Never   Smokeless tobacco: Never  Vaping Use   Vaping status: Never Used  Substance Use Topics   Alcohol use: No   Drug use: No   Family History  Problem Relation Age of Onset   Stroke Mother    Heart disease Mother    Hypertension Maternal Grandmother    No Known Allergies  Review of Systems  Constitutional:  Negative for chills and fever.  HENT:  Negative for sore throat.   Respiratory:  Negative for cough and shortness of breath.   Cardiovascular:  Negative for chest pain, palpitations and leg swelling.  Gastrointestinal:  Negative for abdominal pain, blood in stool, constipation, diarrhea, nausea and vomiting.   Genitourinary:  Negative for dysuria and hematuria.  Musculoskeletal:  Negative for myalgias.  Skin:  Negative for itching and rash.  Neurological:  Negative for dizziness and headaches.  Psychiatric/Behavioral:  Negative for depression and suicidal ideas.      Objective:     BP 135/82   Pulse 61   Resp 16   Ht 5\' 5"  (1.651 m)   Wt 183 lb (83 kg)   SpO2 97%   BMI 30.45 kg/m  BP Readings from Last 3 Encounters:  07/20/23 135/82  04/17/23 126/75  12/19/22 109/61   Physical Exam Vitals reviewed.  Constitutional:      General: She is not in acute distress.    Appearance: Normal appearance. She is not toxic-appearing.  HENT:     Head: Normocephalic and atraumatic.     Right Ear: External ear normal.     Left Ear: External ear normal.     Nose: Nose normal. No congestion or rhinorrhea.     Mouth/Throat:     Mouth: Mucous membranes are moist.     Pharynx: Oropharynx is clear. No oropharyngeal exudate or posterior oropharyngeal erythema.  Eyes:     General: No scleral icterus.    Extraocular Movements: Extraocular movements intact.     Conjunctiva/sclera: Conjunctivae normal.     Pupils: Pupils are equal, round, and reactive to light.  Cardiovascular:     Rate and Rhythm: Normal rate and regular rhythm.     Pulses: Normal pulses.     Heart sounds: Normal heart sounds. No murmur heard.    No friction rub. No gallop.  Pulmonary:     Effort: Pulmonary effort is normal.     Breath sounds: Normal breath sounds. No wheezing, rhonchi or rales.  Abdominal:     General: Abdomen is flat. Bowel sounds are normal. There is no distension.     Palpations: Abdomen is soft.     Tenderness: There is no abdominal tenderness.  Musculoskeletal:        General: No swelling. Normal range of motion.     Cervical back: Normal range of motion.     Right lower leg: No edema.     Left lower leg: No edema.  Lymphadenopathy:     Cervical: No cervical adenopathy.  Skin:    General: Skin is  warm and dry.     Capillary Refill: Capillary refill takes less than 2 seconds.     Coloration: Skin is not jaundiced.  Neurological:     General: No focal deficit present.     Mental Status: She is alert and oriented to person, place, and time.  Psychiatric:        Mood and Affect: Mood normal.        Behavior: Behavior normal.   Last CBC Lab Results  Component Value Date   WBC 6.1 07/27/2022   HGB 13.0 07/27/2022   HCT 40.4 07/27/2022   MCV 92.7 07/27/2022   MCH 29.8 07/27/2022   RDW 13.0 07/27/2022   PLT 210 07/27/2022   Last metabolic panel Lab Results  Component Value Date   GLUCOSE 91 08/15/2022   NA 140 08/15/2022   K 4.4 08/15/2022   CL 103 08/15/2022   CO2 22 08/15/2022   BUN 26 08/15/2022   CREATININE 1.55 (H) 08/15/2022   EGFR 33 (L) 08/15/2022   CALCIUM 10.1 08/15/2022   PHOS 2.6 11/09/2020   PROT 6.8 10/07/2021   ALBUMIN 4.4 10/07/2021   LABGLOB 2.4 10/07/2021   AGRATIO 1.8 10/07/2021   BILITOT 0.3 10/07/2021   ALKPHOS 70 10/07/2021   AST 23 10/07/2021   ALT 21 10/07/2021   ANIONGAP 8 07/27/2022   Last lipids Lab Results  Component Value Date   CHOL 156 02/14/2022   HDL 38 (L) 02/14/2022   LDLCALC 93 02/14/2022   TRIG 142 02/14/2022   CHOLHDL 4.1 02/14/2022   Last hemoglobin A1c Lab Results  Component Value Date   HGBA1C 6.1 (H) 10/07/2021   Last thyroid functions Lab Results  Component Value Date   TSH 0.512 08/15/2022   Last vitamin D Lab Results  Component Value Date   VD25OH 61.1 08/15/2022   Last vitamin B12 and Folate Lab Results  Component Value Date   VITAMINB12 911 08/15/2022   FOLATE >20.0 08/15/2022     Assessment & Plan:   Problem List Items Addressed This Visit       Deep venous thrombosis (HCC)    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  She is prescribed Eliquis 2.5 mg twice daily and will remain on anticoagulation indefinitely.      Essential hypertension - Primary    Remains adequately  controlled on current antihypertensive regimen.  No medication changes are indicated today.      Barrett's esophagus without dysplasia    Symptoms remain adequately controlled with Protonix 40  mg daily.  No medication changes are indicated today.      Prediabetes    A1c 6.1 on labs from January 2023.  Repeat A1c ordered today.      Hyperlipidemia    Lipid panel updated in June 2023.  Total cholesterol 156 and LDL 93.  She is currently prescribed rosuvastatin 10 mg daily.  Repeat lipid panel ordered today.      Return in about 6 months (around 01/17/2024).   Billie Lade, MD

## 2023-07-20 NOTE — Assessment & Plan Note (Signed)
Symptoms remain adequately controlled with Protonix 40 mg daily.  No medication changes are indicated today.

## 2023-07-21 LAB — CBC WITH DIFFERENTIAL/PLATELET
Basophils Absolute: 0 10*3/uL (ref 0.0–0.2)
Basos: 1 %
EOS (ABSOLUTE): 0.6 10*3/uL — ABNORMAL HIGH (ref 0.0–0.4)
Eos: 9 %
Hematocrit: 36.3 % (ref 34.0–46.6)
Hemoglobin: 12 g/dL (ref 11.1–15.9)
Immature Grans (Abs): 0 10*3/uL (ref 0.0–0.1)
Immature Granulocytes: 0 %
Lymphocytes Absolute: 1.8 10*3/uL (ref 0.7–3.1)
Lymphs: 27 %
MCH: 30.2 pg (ref 26.6–33.0)
MCHC: 33.1 g/dL (ref 31.5–35.7)
MCV: 91 fL (ref 79–97)
Monocytes Absolute: 0.6 10*3/uL (ref 0.1–0.9)
Monocytes: 9 %
Neutrophils Absolute: 3.6 10*3/uL (ref 1.4–7.0)
Neutrophils: 54 %
Platelets: 207 10*3/uL (ref 150–450)
RBC: 3.98 x10E6/uL (ref 3.77–5.28)
RDW: 12.9 % (ref 11.7–15.4)
WBC: 6.7 10*3/uL (ref 3.4–10.8)

## 2023-07-21 LAB — B12 AND FOLATE PANEL
Folate: 12.9 ng/mL (ref >3.0)
Vitamin B-12: 743 pg/mL (ref 232–1245)

## 2023-07-21 LAB — LIPID PANEL
Chol/HDL Ratio: 5 ratio — ABNORMAL HIGH (ref 0.0–4.4)
Cholesterol, Total: 166 mg/dL (ref 100–199)
HDL: 33 mg/dL — ABNORMAL LOW (ref >39)
LDL Chol Calc (NIH): 91 mg/dL (ref 0–99)
Triglycerides: 249 mg/dL — ABNORMAL HIGH (ref 0–149)
VLDL Cholesterol Cal: 42 mg/dL — ABNORMAL HIGH (ref 5–40)

## 2023-07-21 LAB — HEMOGLOBIN A1C
Est. average glucose Bld gHb Est-mCnc: 137 mg/dL
Hgb A1c MFr Bld: 6.4 % — ABNORMAL HIGH (ref 4.8–5.6)

## 2023-07-23 ENCOUNTER — Other Ambulatory Visit: Payer: Self-pay | Admitting: Internal Medicine

## 2023-07-24 ENCOUNTER — Telehealth: Payer: Self-pay | Admitting: Internal Medicine

## 2023-07-24 NOTE — Telephone Encounter (Signed)
Patient is requesting call back when office has ELIQUIS 2.5 MG TABS tablet [[id number]]  Samples in.   Also wants to look into alternative medications due to current med cost    Also wants to know if it would be okay for her to wait and take last tablet on Tuesday 11/19 in hopes that office has samples in.

## 2023-07-27 NOTE — Telephone Encounter (Signed)
Unable to reach patient.

## 2023-07-28 ENCOUNTER — Telehealth: Payer: Self-pay | Admitting: Internal Medicine

## 2023-07-28 NOTE — Telephone Encounter (Signed)
Pt is calling to state that she would like to know if she can pick up her elquis medication she  would like a callback

## 2023-07-28 NOTE — Telephone Encounter (Signed)
Spoke to patient

## 2023-07-29 ENCOUNTER — Other Ambulatory Visit: Payer: Self-pay | Admitting: Internal Medicine

## 2023-07-29 DIAGNOSIS — Z7901 Long term (current) use of anticoagulants: Secondary | ICD-10-CM

## 2023-07-29 DIAGNOSIS — Z86718 Personal history of other venous thrombosis and embolism: Secondary | ICD-10-CM

## 2023-08-29 ENCOUNTER — Other Ambulatory Visit: Payer: Self-pay | Admitting: Internal Medicine

## 2023-10-23 ENCOUNTER — Other Ambulatory Visit: Payer: Self-pay | Admitting: Internal Medicine

## 2023-10-23 DIAGNOSIS — I493 Ventricular premature depolarization: Secondary | ICD-10-CM

## 2023-10-24 ENCOUNTER — Other Ambulatory Visit: Payer: Self-pay | Admitting: Internal Medicine

## 2023-10-26 ENCOUNTER — Other Ambulatory Visit: Payer: Self-pay

## 2023-10-26 ENCOUNTER — Encounter (HOSPITAL_COMMUNITY): Payer: Self-pay

## 2023-10-26 ENCOUNTER — Emergency Department (HOSPITAL_COMMUNITY): Payer: Medicare Other

## 2023-10-26 ENCOUNTER — Emergency Department (HOSPITAL_COMMUNITY)
Admission: EM | Admit: 2023-10-26 | Discharge: 2023-10-26 | Disposition: A | Discharge status: Home / Self Care | Payer: Medicare Other | Attending: Emergency Medicine | Admitting: Emergency Medicine

## 2023-10-26 DIAGNOSIS — Z7901 Long term (current) use of anticoagulants: Secondary | ICD-10-CM | POA: Diagnosis not present

## 2023-10-26 DIAGNOSIS — N183 Chronic kidney disease, stage 3 unspecified: Secondary | ICD-10-CM | POA: Diagnosis not present

## 2023-10-26 DIAGNOSIS — I129 Hypertensive chronic kidney disease with stage 1 through stage 4 chronic kidney disease, or unspecified chronic kidney disease: Secondary | ICD-10-CM | POA: Insufficient documentation

## 2023-10-26 DIAGNOSIS — Z79899 Other long term (current) drug therapy: Secondary | ICD-10-CM | POA: Diagnosis not present

## 2023-10-26 DIAGNOSIS — M542 Cervicalgia: Secondary | ICD-10-CM | POA: Insufficient documentation

## 2023-10-26 MED ORDER — METHYLPREDNISOLONE 4 MG PO TBPK: ORAL_TABLET | ORAL | 0 refills | Status: DC

## 2023-10-26 NOTE — ED Triage Notes (Signed)
 Pt arrived via POV c/o on-going neck pain X 4 months. Pt reports seeing her PCP and was referred to PT for her neck pain.

## 2023-10-26 NOTE — Discharge Instructions (Signed)
 As discussed, you may try over-the-counter 4% lidocaine patches to the base of your neck, use as directed.  Do not wear continuously.  Take the prednisone as directed.  Please call your primary care provider this week to arrange follow-up appointment.  Return to the emergency department if you develop any new or worsening symptoms.

## 2023-10-27 ENCOUNTER — Telehealth: Payer: Self-pay

## 2023-10-27 NOTE — Transitions of Care (Post Inpatient/ED Visit) (Unsigned)
   10/27/2023  Name: Nicole Good MRN: [id number] DOB: [birth year]  Today's TOC FU Call Status: Today's TOC FU Call Status:: Unsuccessful Call (1st Attempt) Unsuccessful Call (1st Attempt) Date: 10/27/23  Attempted to reach the patient regarding the most recent Inpatient/ED visit.  Follow Up Plan: Additional outreach attempts will be made to reach the patient to complete the Transitions of Care (Post Inpatient/ED visit) call.   Signature Karena Addison, LPN [facility] Nurse Health Advisor Direct Dial [phone]

## 2023-10-28 NOTE — Transitions of Care (Post Inpatient/ED Visit) (Unsigned)
   10/28/2023  Name: Nicole Good MRN: [id number] DOB: 1941-01-04  Today's TOC FU Call Status: Today's TOC FU Call Status:: Unsuccessful Call (2nd Attempt) Unsuccessful Call (1st Attempt) Date: 10/27/23 Unsuccessful Call (2nd Attempt) Date: 10/28/23  Attempted to reach the patient regarding the most recent Inpatient/ED visit.  Follow Up Plan: Additional outreach attempts will be made to reach the patient to complete the Transitions of Care (Post Inpatient/ED visit) call.   Signature Karena Addison, LPN [facility] Nurse Health Advisor Direct Dial [phone]

## 2023-10-29 NOTE — ED Provider Notes (Signed)
 [place] EMERGENCY DEPARTMENT AT [facility] Provider Note   CSN: [id number] Arrival date & time: 10/26/23  1044     History  Chief Complaint  Patient presents with   Neck Pain    Nicole Good is a 83 y.o. female.   Neck Pain Associated symptoms: no chest pain, no fever, no headaches, no numbness and no weakness        Nicole Good is a 83 y.o. 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  She presents to the Emergency Department complaining of intermittent, dull pains to the base of her neck x 4 months.  She describes fleeting type pains lasting a few seconds that occur multiple times throughout the day and mostly occurs with certain movements of her neck.  She denies any pain numbness or weakness to her upper extremities, headache, dizziness, visual changes or known injury.  She states she was evaluated previously by her PCP for this and was referred to physical therapy without improvement.    Home Medications Prior to Admission medications   Medication Sig Start Date End Date Taking? Authorizing Provider  methylPREDNISolone (MEDROL DOSEPAK) 4 MG TBPK tablet Take 6 tablets day one, 5 tablets day two, 4 tablets day three, 3 tablets day four, 2 tablets day five, then 1 tablet day six 10/26/23  Yes Keywon Mestre, PA-C  apixaban (ELIQUIS) 2.5 MG TABS tablet TAKE 1 TABLET BY MOUTH TWICE A DAY 07/29/23   Billie Lade, MD  Calcium Carb-Cholecalciferol (OYSTER SHELL CALCIUM W/D) 500-5 MG-MCG TABS TAKE 1 TABLET BY MOUTH THREE TIMES A DAY 06/12/23   Billie Lade, MD  cyclobenzaprine (FLEXERIL) 5 MG tablet Take 1 tablet (5 mg total) by mouth 3 (three) times daily as needed for muscle spasms. 04/17/23   Billie Lade, MD  hydrochlorothiazide (HYDRODIURIL) 25 MG tablet Take 1 tablet (25 mg total) by mouth daily. 05/14/23   Billie Lade, MD  losartan (COZAAR) 50 MG tablet Take 1  tablet (50 mg total) by mouth daily. 05/12/23   Billie Lade, MD  lubiprostone (AMITIZA) 24 MCG capsule Take 1 capsule (24 mcg total) by mouth 2 (two) times daily with a meal. 06/01/23 11/28/23  Billie Lade, MD  metoprolol succinate (TOPROL-XL) 25 MG 24 hr tablet TAKE 1 TABLET (25 MG TOTAL) BY MOUTH DAILY. 10/23/23   Billie Lade, MD  Multiple Vitamins-Minerals (MULTIVITAMINS THER. W/MINERALS) TABS Take 1 tablet by mouth every morning.    [provider]  pantoprazole (PROTONIX) 40 MG tablet TAKE 1 TABLET BY MOUTH EVERY DAY 10/26/23   Billie Lade, MD  rosuvastatin (CRESTOR) 10 MG tablet TAKE 1 TABLET BY MOUTH EVERY DAY 08/31/23   Anabel Halon, MD      Allergies    Patient has no known allergies.    Review of Systems   Review of Systems  Constitutional:  Negative for chills and fever.  Eyes:  Negative for visual disturbance.  Respiratory:  Negative for cough, chest tightness and shortness of breath.   Cardiovascular:  Negative for chest pain.  Gastrointestinal:  Negative for nausea and vomiting.  Musculoskeletal:  Positive for neck pain. Negative for back pain.  Neurological:  Negative for dizziness, syncope, weakness, numbness and headaches.    Physical Exam Updated Vital Signs BP 127/75 (BP Location: Right Arm)   Pulse (!) 59   Temp 98.1 F (36.7 C) (Oral)   Resp  15   Ht 5\' 5"  (1.651 m)   Wt 83 kg   SpO2 97%   BMI 30.45 kg/m  Physical Exam Vitals and nursing note reviewed.  Constitutional:      General: She is not in acute distress.    Appearance: Normal appearance. She is not toxic-appearing.  HENT:     Head: Atraumatic.  Neck:     Trachea: Phonation normal.     Comments: Tenderness to palpation at the bilateral cervical paraspinal muscles.  No bony deformities. Cardiovascular:     Rate and Rhythm: Normal rate and regular rhythm.     Pulses: Normal pulses.  Pulmonary:     Effort: Pulmonary effort is normal.  Chest:     Chest wall: No  tenderness.  Musculoskeletal:        General: Normal range of motion.     Cervical back: No edema, rigidity, torticollis or crepitus. Muscular tenderness present. No spinous process tenderness. Normal range of motion.  Lymphadenopathy:     Cervical: No cervical adenopathy.  Skin:    General: Skin is warm.     Capillary Refill: Capillary refill takes less than 2 seconds.  Neurological:     General: No focal deficit present.     Mental Status: She is alert.     Sensory: Sensation is intact. No sensory deficit.     Motor: No weakness.     Coordination: Coordination is intact.     Gait: Gait is intact.     Comments: CN III through XII grossly intact.  Speech clear.  Grip strength is strong and symmetrical bilaterally.  No pronator drift.     ED Results / Procedures / Treatments   Labs (all labs ordered are listed, but only abnormal results are displayed) Labs Reviewed - No data to display  EKG None  Radiology DG Cervical Spine Complete Result Date: 10/26/2023 CLINICAL DATA:  Ongoing neck pain for 4 months. EXAM: CERVICAL SPINE - COMPLETE 4+ VIEW COMPARISON:  CT cervical spine 10/02/2015. FINDINGS: No radiographic evidence of compression fracture. Straightening of the normal cervical lordosis. Trace anterolisthesis of C4 on C5, similar to prior. Endplate osteophytes greatest at C5-6. Facet arthrosis and uncovertebral hypertrophy at multiple levels. Radiographically there is moderate foraminal narrowing on the left at C3-4. Additional mild foraminal narrowing on the left at C5-6 and C6-7 and on the right at C5-6 through C7-T1. No prevertebral soft tissue swelling. IMPRESSION: Degenerative changes of the cervical spine as above. Foraminal narrowing at multiple levels most pronounced on the left at C3-4. Consider MRI for further evaluation if clinically indicated. Electronically Signed   By: Emily Filbert M.D.   On: 10/26/2023 14:12     Procedures Procedures    Medications Ordered in  ED Medications - No data to display  ED Course/ Medical Decision Making/ A&P                                 Medical Decision Making Patient here with chronic neck pain, reports ongoing for 4 months describes having intermittent brief pains at the base of her neck and top of her shoulders aggravated by certain movements.  No known injury.  No radicular symptoms, headache, dizziness, or visual change no extremity numbness or weakness   Symptoms suspected to be musculoskeletal, no focal neurodeficits no muscular weakness on exam. Cervical radiculopathy acute neurologic process also considered in differential  Amount and/or Complexity of Data Reviewed  Radiology: ordered.    Details: C-spine x-ray shows degenerative changes with foraminal narrowing at multiple levels most pronounced on the left at C3 and 4 Discussion of management or test interpretation with external provider(s):   Patient with cervical spine showing foraminal narrowing at multiple levels, patient without neurologic symptoms on exam.  She has tried physical therapy without improvement.  No new or worsening symptoms reported.  I feel a short taper of steroids may be beneficial.  Patient is anticoagulated and therefore not taking NSAIDs.  I have discussed x-ray findings with her, she understands that she may need outpatient evaluation with MRI if her symptoms are not improving.  Her workup does not indicate need for emergent MRI at this time.  She is agreeable to close outpatient follow-up with PCP.  She was given return precautions as well.  Risk Prescription drug management.           Final Clinical Impression(s) / ED Diagnoses Final diagnoses:  Neck pain    Rx / DC Orders ED Discharge Orders          Ordered    methylPREDNISolone (MEDROL DOSEPAK) 4 MG TBPK tablet        10/26/23 1503              Pauline Aus, PA-C 10/29/23 1734    Gloris Manchester, MD 11/02/23 2159

## 2023-10-29 NOTE — Transitions of Care (Post Inpatient/ED Visit) (Signed)
 10/29/2023  Name: Nicole Good MRN: [id number] DOB: 1941-04-22  Today's TOC FU Call Status: Today's TOC FU Call Status:: Successful TOC FU Call Completed Unsuccessful Call (1st Attempt) Date: 10/27/23 Unsuccessful Call (2nd Attempt) Date: 10/28/23 [facility] FU Call Complete Date: 10/29/23 Patient's Name and Date of Birth confirmed.  Transition Care Management Follow-up Telephone Call Date of Discharge: 10/26/23 [facility] Type of Discharge: Emergency Department Reason for ED Visit: Other: (cervicalgia) How have you been since you were released from the hospital?: Better Any questions or concerns?: No  Items Reviewed: Did you receive and understand the discharge instructions provided?: Yes Medications obtained,verified, and reconciled?: Yes (Medications Reviewed) Any new allergies since your discharge?: No Dietary orders reviewed?: NA Do you have support at home?: Yes People in Home: spouse  Medications Reviewed Today: Medications Reviewed Today     Reviewed by Karena Addison, LPN (Licensed Practical Nurse) on 10/29/23 at 1032  Med List Status: <None>   Medication Order Taking? Sig Documenting Provider Last Dose Status Informant  apixaban (ELIQUIS) 2.5 MG TABS tablet [id number]  TAKE 1 TABLET BY MOUTH TWICE A DAY Billie Lade, MD  Active   Calcium Carb-Cholecalciferol (OYSTER SHELL CALCIUM W/D) 500-5 MG-MCG TABS [id number] No TAKE 1 TABLET BY MOUTH THREE TIMES A DAY Billie Lade, MD Taking Active   cyclobenzaprine (FLEXERIL) 5 MG tablet [id number] No Take 1 tablet (5 mg total) by mouth 3 (three) times daily as needed for muscle spasms. Billie Lade, MD Taking Active   hydrochlorothiazide (HYDRODIURIL) 25 MG tablet [id number] No Take 1 tablet (25 mg total) by mouth daily. Billie Lade, MD Taking Active   losartan (COZAAR) 50 MG tablet [id number] No Take 1 tablet (50 mg total) by mouth daily. Billie Lade, MD Taking Active   lubiprostone  [facility]) 24 MCG capsule [id number] No Take 1 capsule (24 mcg total) by mouth 2 (two) times daily with a meal. Billie Lade, MD Taking Active   methylPREDNISolone (MEDROL DOSEPAK) 4 MG TBPK tablet [id number]  Take 6 tablets day one, 5 tablets day two, 4 tablets day three, 3 tablets day four, 2 tablets day five, then 1 tablet day six Triplett, Tammy, PA-C  Active   metoprolol succinate (TOPROL-XL) 25 MG 24 hr tablet [id number]  TAKE 1 TABLET (25 MG TOTAL) BY MOUTH DAILY. Billie Lade, MD  Active   Multiple Vitamins-Minerals (MULTIVITAMINS THER. W/MINERALS) TABS [id number] No Take 1 tablet by mouth every morning. [provider] Taking Active Self  pantoprazole (PROTONIX) 40 MG tablet [id number]  TAKE 1 TABLET BY MOUTH EVERY DAY Billie Lade, MD  Active   rosuvastatin (CRESTOR) 10 MG tablet [id number]  TAKE 1 TABLET BY MOUTH EVERY DAY Anabel Halon, MD  Active             Home Care and Equipment/Supplies: Were Home Health Services Ordered?: NA Any new equipment or medical supplies ordered?: NA  Functional Questionnaire: Do you need assistance with bathing/showering or dressing?: No Do you need assistance with meal preparation?: No Do you need assistance with eating?: No Do you have difficulty maintaining continence: No Do you need assistance with getting out of bed/getting out of a chair/moving?: No Do you have difficulty managing or taking your medications?: No  Follow up appointments reviewed: PCP Follow-up appointment confirmed?: No (declined appt) Specialist Hospital Follow-up appointment confirmed?: NA Do you need transportation to your follow-up appointment?: No Do you understand care options if your condition(s) worsen?: Yes-patient  verbalized understanding    SIGNATURE Karena Addison, LPN [facility] Nurse Health Advisor Direct Dial [phone]

## 2023-11-30 ENCOUNTER — Inpatient Hospital Stay: Attending: Hematology

## 2023-11-30 ENCOUNTER — Other Ambulatory Visit: Payer: Self-pay

## 2023-11-30 DIAGNOSIS — Z90710 Acquired absence of both cervix and uterus: Secondary | ICD-10-CM | POA: Diagnosis not present

## 2023-11-30 DIAGNOSIS — E7800 Pure hypercholesterolemia, unspecified: Secondary | ICD-10-CM | POA: Diagnosis not present

## 2023-11-30 DIAGNOSIS — I129 Hypertensive chronic kidney disease with stage 1 through stage 4 chronic kidney disease, or unspecified chronic kidney disease: Secondary | ICD-10-CM | POA: Diagnosis not present

## 2023-11-30 DIAGNOSIS — N1832 Chronic kidney disease, stage 3b: Secondary | ICD-10-CM | POA: Insufficient documentation

## 2023-11-30 DIAGNOSIS — D472 Monoclonal gammopathy: Secondary | ICD-10-CM | POA: Diagnosis present

## 2023-11-30 DIAGNOSIS — K5900 Constipation, unspecified: Secondary | ICD-10-CM | POA: Diagnosis not present

## 2023-11-30 DIAGNOSIS — Z823 Family history of stroke: Secondary | ICD-10-CM | POA: Insufficient documentation

## 2023-11-30 DIAGNOSIS — Z79899 Other long term (current) drug therapy: Secondary | ICD-10-CM | POA: Insufficient documentation

## 2023-11-30 DIAGNOSIS — Z8249 Family history of ischemic heart disease and other diseases of the circulatory system: Secondary | ICD-10-CM | POA: Diagnosis not present

## 2023-11-30 DIAGNOSIS — Z7901 Long term (current) use of anticoagulants: Secondary | ICD-10-CM | POA: Diagnosis not present

## 2023-11-30 DIAGNOSIS — Z86718 Personal history of other venous thrombosis and embolism: Secondary | ICD-10-CM | POA: Insufficient documentation

## 2023-11-30 DIAGNOSIS — R809 Proteinuria, unspecified: Secondary | ICD-10-CM | POA: Diagnosis not present

## 2023-11-30 DIAGNOSIS — Z86711 Personal history of pulmonary embolism: Secondary | ICD-10-CM | POA: Diagnosis not present

## 2023-11-30 DIAGNOSIS — Z8719 Personal history of other diseases of the digestive system: Secondary | ICD-10-CM | POA: Insufficient documentation

## 2023-11-30 LAB — CBC WITH DIFFERENTIAL/PLATELET
Abs Immature Granulocytes: 0.02 10*3/uL (ref 0.00–0.07)
Basophils Absolute: 0 10*3/uL (ref 0.0–0.1)
Basophils Relative: 1 %
Eosinophils Absolute: 0.2 10*3/uL (ref 0.0–0.5)
Eosinophils Relative: 4 %
HCT: 38.6 % (ref 36.0–46.0)
Hemoglobin: 12.2 g/dL (ref 12.0–15.0)
Immature Granulocytes: 0 %
Lymphocytes Relative: 27 %
Lymphs Abs: 1.4 10*3/uL (ref 0.7–4.0)
MCH: 30 pg (ref 26.0–34.0)
MCHC: 31.6 g/dL (ref 30.0–36.0)
MCV: 94.8 fL (ref 80.0–100.0)
Monocytes Absolute: 0.4 10*3/uL (ref 0.1–1.0)
Monocytes Relative: 8 %
Neutro Abs: 3.1 10*3/uL (ref 1.7–7.7)
Neutrophils Relative %: 60 %
Platelets: 207 10*3/uL (ref 150–400)
RBC: 4.07 MIL/uL (ref 3.87–5.11)
RDW: 13.2 % (ref 11.5–15.5)
WBC: 5.1 10*3/uL (ref 4.0–10.5)
nRBC: 0 % (ref 0.0–0.2)

## 2023-11-30 LAB — COMPREHENSIVE METABOLIC PANEL
ALT: 17 U/L (ref 0–44)
AST: 20 U/L (ref 15–41)
Albumin: 4.1 g/dL (ref 3.5–5.0)
Alkaline Phosphatase: 49 U/L (ref 38–126)
Anion gap: 9 (ref 5–15)
BUN: 28 mg/dL — ABNORMAL HIGH (ref 8–23)
CO2: 25 mmol/L (ref 22–32)
Calcium: 9.6 mg/dL (ref 8.9–10.3)
Chloride: 106 mmol/L (ref 98–111)
Creatinine, Ser: 1.5 mg/dL — ABNORMAL HIGH (ref 0.44–1.00)
GFR, Estimated: 34 mL/min — ABNORMAL LOW (ref >60)
Glucose, Bld: 90 mg/dL (ref 70–99)
Potassium: 4.1 mmol/L (ref 3.5–5.1)
Sodium: 140 mmol/L (ref 135–145)
Total Bilirubin: 0.4 mg/dL (ref 0.0–1.2)
Total Protein: 7.5 g/dL (ref 6.5–8.1)

## 2023-11-30 LAB — LACTATE DEHYDROGENASE: LDH: 132 U/L (ref 98–192)

## 2023-11-30 NOTE — Progress Notes (Signed)
 Orders placed per Rojelio Brenner, PA-C orders for SPEP, immunofixation, kappa/lambda light chains, CMP, CBC/D, LDH.

## 2023-12-01 LAB — IMMUNOFIXATION ELECTROPHORESIS
IgA: 204 mg/dL (ref 64–422)
IgG (Immunoglobin G), Serum: 1080 mg/dL (ref 586–1602)
IgM (Immunoglobulin M), Srm: 66 mg/dL (ref 26–217)
Total Protein ELP: 6.9 g/dL (ref 6.0–8.5)

## 2023-12-01 LAB — KAPPA/LAMBDA LIGHT CHAINS
Kappa free light chain: 33.9 mg/L — ABNORMAL HIGH (ref 3.3–19.4)
Kappa, lambda light chain ratio: 1.93 — ABNORMAL HIGH (ref 0.26–1.65)
Lambda free light chains: 17.6 mg/L (ref 5.7–26.3)

## 2023-12-01 LAB — PROTEIN ELECTROPHORESIS, SERUM
A/G Ratio: 1.1 (ref 0.7–1.7)
Albumin ELP: 3.7 g/dL (ref 2.9–4.4)
Alpha-1-Globulin: 0.2 g/dL (ref 0.0–0.4)
Alpha-2-Globulin: 0.9 g/dL (ref 0.4–1.0)
Beta Globulin: 1.1 g/dL (ref 0.7–1.3)
Gamma Globulin: 1.1 g/dL (ref 0.4–1.8)
Globulin, Total: 3.3 g/dL (ref 2.2–3.9)
Total Protein ELP: 7 g/dL (ref 6.0–8.5)

## 2023-12-05 NOTE — Progress Notes (Unsigned)
 [facility] [address], [place] [address]   CLINIC:  Medical Oncology/Hematology  PCP:  Billie Lade, MD [address] [place] [address] [phone]   REASON FOR VISIT:  Follow-up for MGUS  CURRENT THERAPY: Reestablish care (lost to follow-up since 2022)  INTERVAL HISTORY:   Ms. Forget 83 y.o. female returns for routine follow-up of MGUS.  She was previously evaluated by Dr. Ellin Saba in 2022, but was lost to follow-up.  She returns today after having been referred back to [place] for MGUS, at the request of Dr. Wolfgang Phoenix.  ***  At today's visit, she reports feeling ***.  No recent hospitalizations, surgeries, or changes in baseline health status.  ***She denies any new bone pain or recent fractures. ***She denies any B symptoms such as fever, chills, night sweats, unintentional weight loss..   ***No new neurologic symptoms such as tinnitus, new-onset hearing loss, blurred vision, headache, or dizziness. *** Denies any numbness or tingling in hands or feet. ***No thromboembolic events since her last visit.  ***No new masses or lymphadenopathy per her report.   She has ***% energy and ***% appetite. She endorses that she is maintaining a stable weight.  ASSESSMENT & PLAN:  1.  IgA Lambda MGUS: - Patient seen at the request of Dr. Wolfgang Phoenix, reestablishing with hematology/oncology in March 2025 - Previously seen by Dr. Ellin Saba in 2022 for IgA lambda MGUS, lost to follow-up since September 2022 - Labs from nephrology (10/22/2020) for workup of CKD showed faint IgA lambda monoclonal immunoglobulin in urine - Skeletal survey from August 2022 did not show any lytic lesions.  24-hour urine from August 2022 was negative for immunofixation and M spike. - Repeat SPEP and immunofixation in September 2022 were negative, lost to follow-up after that visit. - Most recent nephrology labs by Dr. Wolfgang Phoenix (11/09/2023): "Poorly defined band of restricted protein  mobility detected in gamma globulins; unlikely that this may represent a monoclonal protein" - Most recent MGUS/myeloma panel (11/30/2023): Immunofixation normal. SPEP negative for M spike. Minimally elevated FLC ratio (1.93), likely secondary to CKD. Hgb 12.2, creatinine 1.50, calcium 9.6.  LDH normal. - She denies any fevers, night sweats or weight loss. *** No new bone lesions.***  No neuropathy symptoms.  ***No recurrent infections noted. - PLAN: No definitive evidence of plasma cell dyscrasia at this time, but we will continue monitoring of any evolving plasma cell disorder with repeat labs and RTC in 1 year.  *** If any positive immunofixation or M spike, we will check repeat urine study and skeletal survey.  2.  CKD: - Stage IIIb CKD since 2009, thought to be secondary to hypertension and age associated decline in GFR. - Proteinuria was less than 100 mg per 24 hours. - She will continue follow-up with Dr. Wolfgang Phoenix.  3.  Social/family history: - She lives at home with her husband. 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  Non-smoker.  No exposure to chemicals. - Maternal cousin had myeloma.  PLAN SUMMARY: >> Labs in 1 year = SPEP, immunofixation, kappa/lambda light chains, CMP, CBC/D, LDH >> OFFICE visit in 1 year (1 week after labs) >> ***    REVIEW OF SYSTEMS: ***  Review of Systems - Oncology   PHYSICAL EXAM:  ECOG PERFORMANCE STATUS: {CHL ONC ECOG IE:[id number]} *** There were no vitals filed for this visit. There were no vitals filed for this visit. Physical Exam  PAST MEDICAL/SURGICAL HISTORY:  Past Medical History:  Diagnosis  Date   Arthritis    "maybe in my right knee and hip" (12/03/2015)   DVT (deep venous thrombosis) (HCC) 12/03/2015   LLE   Heart murmur    "when I was a little girl; gone now"   High cholesterol    Hypertension    Osteoporosis    Prolapsed internal hemorrhoids, grade 3 07/04/2014   Pulmonary emboli (HCC) ~ 2012   Right arm weakness  10/30/2015   Past Surgical History:  Procedure Laterality Date   ABDOMINAL HYSTERECTOMY  ~ 1979   "partial"   CATARACT EXTRACTION W/ INTRAOCULAR LENS  IMPLANT, BILATERAL Bilateral 2017   DILATION AND CURETTAGE OF UTERUS     TONSILLECTOMY  1950    SOCIAL HISTORY:  Social History   Socioeconomic History   Marital status: Married    Spouse name: Not on file   Number of children: 1   Years of education: MA   Highest education level: Not on file  Occupational History   Occupation: retired    Comment: middle school principle   Tobacco Use   Smoking status: Never   Smokeless tobacco: Never  Vaping Use   Vaping status: Never Used  Substance and Sexual Activity   Alcohol use: No   Drug use: No   Sexual activity: Never  Other Topics Concern   Not on file  Social History Narrative   Lives with husband-married 33 years       1 son- no grandkids    Right handed       Enjoys: reads, cleaned and organized, piano       Diet: eats all foods groups and tries to balanced diet well-occasionally hot dog and red meat   Caffeine: hot chocolate, green tea   Water: 3 liters      Exercise 55 minutes 5 days of week       Wears seat belt   Smoke detectors at home   Does not use phone while driving   Social Drivers of Health   Financial Resource Strain: Low Risk  (01/15/2021)   Overall Financial Resource Strain (CARDIA)    Difficulty of Paying Living Expenses: Not hard at all  Food Insecurity: No Food Insecurity (01/15/2021)   Hunger Vital Sign    Worried About Running Out of Food in the Last Year: Never true    Ran Out of Food in the Last Year: Never true  Transportation Needs: No Transportation Needs (01/15/2021)   PRAPARE - Administrator, Civil Service (Medical): No    Lack of Transportation (Non-Medical): No  Physical Activity: Sufficiently Active (01/15/2021)   Exercise Vital Sign    Days of Exercise per Week: 4 days    Minutes of Exercise per Session: 40 min   Stress: No Stress Concern Present (01/15/2021)   [employer] of Occupational Health - Occupational Stress Questionnaire    Feeling of Stress : Not at all  Social Connections: Socially Integrated (01/15/2021)   Social Connection and Isolation Panel [NHANES]    Frequency of Communication with Friends and Family: More than three times a week    Frequency of Social Gatherings with Friends and Family: More than three times a week    Attends Religious Services: More than 4 times per year    Active Member of [employer] or Organizations: No    Attends Banker Meetings: 1 to 4 times per year    Marital Status: Married  Catering manager Violence: Not At Risk (01/15/2021)  Humiliation, Afraid, Rape, and Kick questionnaire    Fear of Current or Ex-Partner: No    Emotionally Abused: No    Physically Abused: No    Sexually Abused: No    FAMILY HISTORY:  Family History  Problem Relation Age of Onset   Stroke Mother    Heart disease Mother    Hypertension Maternal Grandmother     CURRENT MEDICATIONS:  Outpatient Encounter Medications as of 12/07/2023  Medication Sig   apixaban (ELIQUIS) 2.5 MG TABS tablet TAKE 1 TABLET BY MOUTH TWICE A DAY   Calcium Carb-Cholecalciferol (OYSTER SHELL CALCIUM W/D) 500-5 MG-MCG TABS TAKE 1 TABLET BY MOUTH THREE TIMES A DAY   cyclobenzaprine (FLEXERIL) 5 MG tablet Take 1 tablet (5 mg total) by mouth 3 (three) times daily as needed for muscle spasms.   hydrochlorothiazide (HYDRODIURIL) 25 MG tablet Take 1 tablet (25 mg total) by mouth daily.   losartan (COZAAR) 50 MG tablet Take 1 tablet (50 mg total) by mouth daily.   methylPREDNISolone (MEDROL DOSEPAK) 4 MG TBPK tablet Take 6 tablets day one, 5 tablets day two, 4 tablets day three, 3 tablets day four, 2 tablets day five, then 1 tablet day six   metoprolol succinate (TOPROL-XL) 25 MG 24 hr tablet TAKE 1 TABLET (25 MG TOTAL) BY MOUTH DAILY.   Multiple Vitamins-Minerals (MULTIVITAMINS THER. W/MINERALS)  TABS Take 1 tablet by mouth every morning.   pantoprazole (PROTONIX) 40 MG tablet TAKE 1 TABLET BY MOUTH EVERY DAY   rosuvastatin (CRESTOR) 10 MG tablet TAKE 1 TABLET BY MOUTH EVERY DAY   No facility-administered encounter medications on file as of 12/07/2023.    ALLERGIES:  No Known Allergies  LABORATORY DATA:  I have reviewed the labs as listed.  CBC    Component Value Date/Time   WBC 5.1 11/30/2023 1105   RBC 4.07 11/30/2023 1105   HGB 12.2 11/30/2023 1105   HGB 12.0 07/20/2023 1452   HCT 38.6 11/30/2023 1105   HCT 36.3 07/20/2023 1452   PLT 207 11/30/2023 1105   PLT 207 07/20/2023 1452   MCV 94.8 11/30/2023 1105   MCV 91 07/20/2023 1452   MCH 30.0 11/30/2023 1105   MCHC 31.6 11/30/2023 1105   RDW 13.2 11/30/2023 1105   RDW 12.9 07/20/2023 1452   LYMPHSABS 1.4 11/30/2023 1105   LYMPHSABS 1.8 07/20/2023 1452   MONOABS 0.4 11/30/2023 1105   EOSABS 0.2 11/30/2023 1105   EOSABS 0.6 (H) 07/20/2023 1452   BASOSABS 0.0 11/30/2023 1105   BASOSABS 0.0 07/20/2023 1452      Latest Ref Rng & Units 11/30/2023   11:05 AM 08/15/2022   11:04 AM 07/27/2022   11:39 AM  CMP  Glucose 70 - 99 mg/dL 90  91  98   BUN 8 - 23 mg/dL 28  26  28    Creatinine 0.44 - 1.00 mg/dL 9.60  4.54  0.98   Sodium 135 - 145 mmol/L 140  140  139   Potassium 3.5 - 5.1 mmol/L 4.1  4.4  4.1   Chloride 98 - 111 mmol/L 106  103  105   CO2 22 - 32 mmol/L 25  22  26    Calcium 8.9 - 10.3 mg/dL 9.6  11.9  9.5   Total Protein 6.5 - 8.1 g/dL 7.5     Total Bilirubin 0.0 - 1.2 mg/dL 0.4     Alkaline Phos 38 - 126 U/L 49     AST 15 - 41 U/L 20  ALT 0 - 44 U/L 17       DIAGNOSTIC IMAGING:  I have independently reviewed the relevant imaging and discussed with the patient.   WRAP UP:  All questions were answered. The patient knows to call the clinic with any problems, questions or concerns.  Medical decision making: ***  Time spent on visit: I spent *** minutes counseling the patient face to face. The  total time spent in the appointment was *** minutes and more than 50% was on counseling.  Carnella Guadalajara, PA-C  ***

## 2023-12-07 ENCOUNTER — Inpatient Hospital Stay (HOSPITAL_BASED_OUTPATIENT_CLINIC_OR_DEPARTMENT_OTHER): Admitting: Physician Assistant

## 2023-12-07 VITALS — BP 137/82 | HR 55 | Temp 99.0°F | Resp 16 | Wt 185.0 lb

## 2023-12-07 DIAGNOSIS — D472 Monoclonal gammopathy: Secondary | ICD-10-CM

## 2023-12-07 NOTE — Patient Instructions (Signed)
 [facility] at [facility] **VISIT SUMMARY & IMPORTANT INSTRUCTIONS **   You were seen today by Rojelio Brenner PA-C for your follow-up of abnormal protein in your blood.   Your kidney doctor (Dr. Wolfgang Phoenix) found a possible abnormal protein in your blood.  In some patients, this protein can be a precancerous condition. However, when we checked your labs there was NO ABNORMAL PROTEIN. We will check your labs again in 1 year.  FOLLOW-UP APPOINTMENT: 1 year  ** Thank you for trusting me with your healthcare!  I strive to provide all of my patients with quality care at each visit.  If you receive a survey for this visit, I would be so grateful to you for taking the time to provide feedback.  Thank you in advance!  ~ Ruhi Kopke                   Dr. Doreatha Massed   &   Rojelio Brenner, PA-C   - - - - - - - - - - - - - - - - - -    Thank you for choosing [facility] at [facility] to provide your oncology and hematology care.  To afford each patient quality time with our provider, please arrive at least 15 minutes before your scheduled appointment time.   If you have a lab appointment with the Cancer Center please come in thru the Main Entrance and check in at the main information desk.  You need to re-schedule your appointment should you arrive 10 or more minutes late.  We strive to give you quality time with our providers, and arriving late affects you and other patients whose appointments are after yours.  Also, if you no show three or more times for appointments you may be dismissed from the clinic at the providers discretion.     Again, thank you for choosing [facility].  Our hope is that these requests will decrease the amount of time that you wait before being seen by our physicians.       _____________________________________________________________  Should you have questions after your visit to [facility],  please contact our office at [phone] and follow the prompts.  Our office hours are 8:00 a.m. and 4:30 p.m. Monday - Friday.  Please note that voicemails left after 4:00 p.m. may not be returned until the following business day.  We are closed weekends and major holidays.  You do have access to a nurse 24-7, just call the main number to the clinic [phone] and do not press any options, hold on the line and a nurse will answer the phone.    For prescription refill requests, have your pharmacy contact our office and allow 72 hours.

## 2023-12-14 DIAGNOSIS — M542 Cervicalgia: Secondary | ICD-10-CM | POA: Insufficient documentation

## 2023-12-20 ENCOUNTER — Other Ambulatory Visit: Payer: Self-pay | Admitting: Internal Medicine

## 2023-12-20 DIAGNOSIS — I10 Essential (primary) hypertension: Secondary | ICD-10-CM

## 2023-12-23 ENCOUNTER — Ambulatory Visit (INDEPENDENT_AMBULATORY_CARE_PROVIDER_SITE_OTHER): Payer: Self-pay

## 2023-12-23 VITALS — BP 127/73 | HR 56 | Ht 64.0 in | Wt 184.0 lb

## 2023-12-23 DIAGNOSIS — R5381 Other malaise: Secondary | ICD-10-CM | POA: Diagnosis not present

## 2023-12-23 NOTE — Progress Notes (Signed)
   Acute Office Visit  Subjective:     Patient ID: Nicole Good, female    DOB: 1941/01/07, 83 y.o.   MRN: [id number]  Chief Complaint  Patient presents with   Medical Management of Chronic Issues    Pt states she's "Been off balance, semi numbness in her fingers and sole of her feet"    HPI Patient is in today for feeling "off balance", wouldn't describe as dizziness.  Also having tingling in her fingertips.   ROS      Objective:    BP 127/73   Pulse (!) 56   Ht 5\' 4"  (1.626 m)   Wt 184 lb (83.5 kg)   SpO2 98%   BMI 31.58 kg/m  BP Readings from Last 3 Encounters:  12/23/23 127/73  12/07/23 137/82  10/26/23 127/75   Wt Readings from Last 3 Encounters:  12/23/23 184 lb (83.5 kg)  12/07/23 184 lb 15.5 oz (83.9 kg)  10/26/23 182 lb 15.7 oz (83 kg)      Physical Exam Vitals and nursing note reviewed.  Constitutional:      Appearance: Normal appearance.  HENT:     Head: Normocephalic.     Right Ear: Tympanic membrane, ear canal and external ear normal.     Left Ear: Tympanic membrane, ear canal and external ear normal.     Nose: Nose normal.     Mouth/Throat:     Mouth: Mucous membranes are moist.     Pharynx: Oropharynx is clear.  Eyes:     Extraocular Movements: Extraocular movements intact.     Pupils: Pupils are equal, round, and reactive to light.  Cardiovascular:     Rate and Rhythm: Normal rate and regular rhythm.  Pulmonary:     Effort: Pulmonary effort is normal.     Breath sounds: Normal breath sounds.  Musculoskeletal:     Cervical back: Normal range of motion and neck supple.  Skin:    General: Skin is warm and dry.  Neurological:     Mental Status: She is alert and oriented to person, place, and time.  Psychiatric:        Mood and Affect: Mood normal.        Thought Content: Thought content normal.     No results found for any visits on 12/23/23.      Assessment & Plan:   Problem List Items Addressed This Visit   None Visit  Diagnoses       Malaise    -  Primary   Exam in office today is unremarkable.  recommend increasing fluid intake and rest over the next week to see if any improvement.  recommend f/u if no improvement       No orders of the defined types were placed in this encounter.   No follow-ups on file.  Alison Irvine, FNP

## 2024-01-13 ENCOUNTER — Ambulatory Visit (INDEPENDENT_AMBULATORY_CARE_PROVIDER_SITE_OTHER): Payer: Medicare Other | Admitting: Internal Medicine

## 2024-01-13 ENCOUNTER — Encounter: Payer: Self-pay | Admitting: Internal Medicine

## 2024-01-13 VITALS — BP 124/84 | HR 104 | Ht 64.0 in | Wt 182.4 lb

## 2024-01-13 DIAGNOSIS — K2270 Barrett's esophagus without dysplasia: Secondary | ICD-10-CM

## 2024-01-13 DIAGNOSIS — N1832 Chronic kidney disease, stage 3b: Secondary | ICD-10-CM | POA: Diagnosis not present

## 2024-01-13 DIAGNOSIS — I824Y9 Acute embolism and thrombosis of unspecified deep veins of unspecified proximal lower extremity: Secondary | ICD-10-CM

## 2024-01-13 DIAGNOSIS — E782 Mixed hyperlipidemia: Secondary | ICD-10-CM

## 2024-01-13 DIAGNOSIS — R7303 Prediabetes: Secondary | ICD-10-CM | POA: Diagnosis not present

## 2024-01-13 DIAGNOSIS — I10 Essential (primary) hypertension: Secondary | ICD-10-CM

## 2024-01-13 NOTE — Progress Notes (Signed)
 Established Patient Office Visit  Subjective   Patient ID: Nicole Good, female    DOB: 1940-11-14  Age: 83 y.o. MRN: [id number]  Chief Complaint  Patient presents with   Care Management    Six month follow up    Nicole Good returns to care today for routine follow-up.  She was last evaluated by me in November 2024.  No medication changes were made at that time, repeat labs ordered, and 27-month follow-up arranged.  ER presentation on 2/17 endorsing neck pain.  X-rays of the cervical spine showed degenerative changes with foraminal narrowing at multiple levels.  Treated with steroids and recommended to follow-up with her PCP.  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  Most recently, she presented to [facility] for an acute visit on 4/16 in the setting of malaise.  Treated supportively.  Today she reports feeling fairly well but continues to endorse posterior neck pain.  She has established care with orthopedic surgery in recently completed an MRI.  She is scheduled for follow-up 5/9.  She does not have any acute concerns to discuss today.  Past Medical History:  Diagnosis Date   Arthritis    "maybe in my right knee and hip" (12/03/2015)   DVT (deep venous thrombosis) (HCC) 12/03/2015   LLE   Heart murmur    "when I was a little girl; gone now"   High cholesterol    Hypertension    Osteoporosis    Prolapsed internal hemorrhoids, grade 3 07/04/2014   Pulmonary emboli (HCC) ~ 2012   Right arm weakness 10/30/2015   Past Surgical History:  Procedure Laterality Date   ABDOMINAL HYSTERECTOMY  ~ 1979   "partial"   CATARACT EXTRACTION W/ INTRAOCULAR LENS  IMPLANT, BILATERAL Bilateral 2017   DILATION AND CURETTAGE OF UTERUS     TONSILLECTOMY  1950   Social History   Tobacco Use   Smoking status: Never   Smokeless tobacco: Never  Vaping Use   Vaping status: Never Used  Substance Use Topics   Alcohol use: No   Drug use: No   Family History   Problem Relation Age of Onset   Stroke Mother    Heart disease Mother    Hypertension Maternal Grandmother    No Known Allergies  Review of Systems  Constitutional:  Negative for chills and fever.  HENT:  Negative for sore throat.   Respiratory:  Negative for cough and shortness of breath.   Cardiovascular:  Negative for chest pain, palpitations and leg swelling.  Gastrointestinal:  Negative for abdominal pain, blood in stool, constipation, diarrhea, nausea and vomiting.  Genitourinary:  Negative for dysuria and hematuria.  Musculoskeletal:  Positive for neck pain. Negative for myalgias.  Skin:  Negative for itching and rash.  Neurological:  Negative for dizziness and headaches.  Psychiatric/Behavioral:  Negative for depression and suicidal ideas.      Objective:     BP 124/84   Pulse (!) 104   Ht 5\' 4"  (1.626 m)   Wt 182 lb 6.4 oz (82.7 kg)   SpO2 97%   BMI 31.31 kg/m  BP Readings from Last 3 Encounters:  01/13/24 124/84  12/23/23 127/73   Physical Exam Vitals reviewed.  Constitutional:      General: She is not in acute distress.    Appearance: Normal appearance. She is not toxic-appearing.  HENT:     Head: Normocephalic and atraumatic.     Right Ear: External  ear normal.     Left Ear: External ear normal.     Nose: Nose normal. No congestion or rhinorrhea.     Mouth/Throat:     Mouth: Mucous membranes are moist.     Pharynx: Oropharynx is clear. No oropharyngeal exudate or posterior oropharyngeal erythema.  Eyes:     General: No scleral icterus.    Extraocular Movements: Extraocular movements intact.     Conjunctiva/sclera: Conjunctivae normal.     Pupils: Pupils are equal, round, and reactive to light.  Cardiovascular:     Rate and Rhythm: Normal rate and regular rhythm.     Pulses: Normal pulses.     Heart sounds: Normal heart sounds. No murmur heard.    No friction rub. No gallop.  Pulmonary:     Effort: Pulmonary effort is normal.     Breath sounds:  Normal breath sounds. No wheezing, rhonchi or rales.  Abdominal:     General: Abdomen is flat. Bowel sounds are normal. There is no distension.     Palpations: Abdomen is soft.     Tenderness: There is no abdominal tenderness.  Musculoskeletal:        General: No swelling. Normal range of motion.     Cervical back: Normal range of motion.     Right lower leg: No edema.     Left lower leg: No edema.  Lymphadenopathy:     Cervical: No cervical adenopathy.  Skin:    General: Skin is warm and dry.     Capillary Refill: Capillary refill takes less than 2 seconds.     Coloration: Skin is not jaundiced.  Neurological:     General: No focal deficit present.     Mental Status: She is alert and oriented to person, place, and time.  Psychiatric:        Mood and Affect: Mood normal.        Behavior: Behavior normal.   Last CBC Lab Results  Component Value Date   WBC 5.1 11/30/2023   HGB 12.2 11/30/2023   HCT 38.6 11/30/2023   MCV 94.8 11/30/2023   MCH 30.0 11/30/2023   RDW 13.2 11/30/2023   PLT 207 11/30/2023   Last metabolic panel Lab Results  Component Value Date   GLUCOSE 90 11/30/2023   NA 140 11/30/2023   K 4.1 11/30/2023   CL 106 11/30/2023   CO2 25 11/30/2023   BUN 28 (H) 11/30/2023   CREATININE 1.50 (H) 11/30/2023   GFRNONAA 34 (L) 11/30/2023   CALCIUM  9.6 11/30/2023   PHOS 2.6 11/09/2020   PROT 7.5 11/30/2023   ALBUMIN 4.1 11/30/2023   LABGLOB 3.3 11/30/2023   AGRATIO 1.1 11/30/2023   BILITOT 0.4 11/30/2023   ALKPHOS 49 11/30/2023   AST 20 11/30/2023   ALT 17 11/30/2023   ANIONGAP 9 11/30/2023   Last lipids Lab Results  Component Value Date   CHOL 186 01/13/2024   HDL 33 (L) 01/13/2024   LDLCALC 108 (H) 01/13/2024   TRIG 261 (H) 01/13/2024   CHOLHDL 5.6 (H) 01/13/2024   Last hemoglobin A1c Lab Results  Component Value Date   HGBA1C 6.3 (H) 01/13/2024   Last thyroid  functions Lab Results  Component Value Date   TSH 0.508 01/13/2024   Last  vitamin D  Lab Results  Component Value Date   VD25OH 61.1 08/15/2022   Last vitamin B12 and Folate Lab Results  Component Value Date   VITAMINB12 743 07/20/2023   FOLATE 12.9 07/20/2023  Assessment & Plan:   Problem List Items Addressed This Visit       Deep venous thrombosis (HCC)   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  She remains on Eliquis  2.5 mg twice daily.      Essential hypertension   Adequately controlled on current antihypertensive regimen.  No medication changes are indicated today.      Barrett's esophagus without dysplasia   Stable.  She remains on pantoprazole  40 mg daily.  She underwent repeat EGD in February with no acute findings identified.      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  Renal function stable on labs from March.      Prediabetes   A1c 6.4 on labs from November 2024.  Repeat A1c ordered today.      Hyperlipidemia   Lipid panel last updated in November 2024.  Total cholesterol 166 and LDL 91.  She is currently prescribed rosuvastatin  10 mg daily.  Repeat lipid panel ordered today.      Return in about 6 months (around 07/15/2024).   Tobi Fortes, MD

## 2024-01-13 NOTE — Patient Instructions (Signed)
 It was a pleasure to see you today.  Thank you for giving us  the opportunity to be involved in your care.  Below is a brief recap of your visit and next steps.  We will plan to see you again in 6 months.  Summary No medication changes today Repeat labs Follow up in 6 months

## 2024-01-14 LAB — LIPID PANEL
Chol/HDL Ratio: 5.6 ratio — ABNORMAL HIGH (ref 0.0–4.4)
Cholesterol, Total: 186 mg/dL (ref 100–199)
HDL: 33 mg/dL — ABNORMAL LOW (ref >39)
LDL Chol Calc (NIH): 108 mg/dL — ABNORMAL HIGH (ref 0–99)
Triglycerides: 261 mg/dL — ABNORMAL HIGH (ref 0–149)
VLDL Cholesterol Cal: 45 mg/dL — ABNORMAL HIGH (ref 5–40)

## 2024-01-14 LAB — TSH+FREE T4
Free T4: 1.36 ng/dL (ref 0.82–1.77)
TSH: 0.508 u[IU]/mL (ref 0.450–4.500)

## 2024-01-14 LAB — HEMOGLOBIN A1C
Est. average glucose Bld gHb Est-mCnc: 134 mg/dL
Hgb A1c MFr Bld: 6.3 % — ABNORMAL HIGH (ref 4.8–5.6)

## 2024-01-19 ENCOUNTER — Ambulatory Visit: Payer: Medicare Other | Admitting: Internal Medicine

## 2024-01-20 ENCOUNTER — Other Ambulatory Visit: Payer: Self-pay | Admitting: Internal Medicine

## 2024-01-20 DIAGNOSIS — I493 Ventricular premature depolarization: Secondary | ICD-10-CM

## 2024-01-25 ENCOUNTER — Ambulatory Visit: Payer: Medicare Other

## 2024-01-28 ENCOUNTER — Ambulatory Visit: Attending: Medical | Admitting: Physician Assistant

## 2024-01-28 ENCOUNTER — Encounter: Payer: Self-pay | Admitting: Medical

## 2024-01-28 VITALS — BP 110/60 | HR 54 | Ht 64.0 in | Wt 181.0 lb

## 2024-01-28 DIAGNOSIS — Z7901 Long term (current) use of anticoagulants: Secondary | ICD-10-CM | POA: Diagnosis present

## 2024-01-28 DIAGNOSIS — Z86718 Personal history of other venous thrombosis and embolism: Secondary | ICD-10-CM | POA: Diagnosis present

## 2024-01-28 DIAGNOSIS — Z86711 Personal history of pulmonary embolism: Secondary | ICD-10-CM | POA: Diagnosis present

## 2024-01-28 DIAGNOSIS — I517 Cardiomegaly: Secondary | ICD-10-CM | POA: Diagnosis present

## 2024-01-28 DIAGNOSIS — R002 Palpitations: Secondary | ICD-10-CM | POA: Insufficient documentation

## 2024-01-28 DIAGNOSIS — I10 Essential (primary) hypertension: Secondary | ICD-10-CM | POA: Diagnosis present

## 2024-01-28 DIAGNOSIS — E782 Mixed hyperlipidemia: Secondary | ICD-10-CM | POA: Insufficient documentation

## 2024-01-28 DIAGNOSIS — N1832 Chronic kidney disease, stage 3b: Secondary | ICD-10-CM | POA: Diagnosis present

## 2024-01-28 DIAGNOSIS — R7303 Prediabetes: Secondary | ICD-10-CM | POA: Diagnosis present

## 2024-01-28 DIAGNOSIS — I5189 Other ill-defined heart diseases: Secondary | ICD-10-CM | POA: Insufficient documentation

## 2024-01-28 MED ORDER — ROSUVASTATIN CALCIUM 20 MG PO TABS: 20.0000 mg | ORAL_TABLET | Freq: Every day | ORAL | 3 refills | Status: AC

## 2024-01-28 MED ORDER — APIXABAN 2.5 MG PO TABS: 2.5000 mg | ORAL_TABLET | Freq: Two times a day (BID) | ORAL | 0 refills | Status: DC

## 2024-01-28 NOTE — Patient Instructions (Signed)
 Medication Instructions:  Your physician has recommended you make the following change in your medication:   Increase Crestor to 20 mg Daily   *If you need a refill on your cardiac medications before your next appointment, please call your pharmacy*  Lab Work: Your physician recommends that you return for lab work just before your MRI.   Your physician recommends that you return for lab work in: 3 Months ( CMP, FLP)    If you have labs (blood work) drawn today and your tests are completely normal, you will receive your results only by: MyChart Message (if you have MyChart) OR A paper copy in the mail If you have any lab test that is abnormal or we need to change your treatment, we will call you to review the results.  Testing/Procedures:   You are scheduled for Cardiac MRI at the location below.  Please arrive for your appointment at ______________ . ?  [facility] [address] [place], [place] [address] Please take advantage of the free valet parking available at the [facility] [employer] (Entrance C).  Proceed to the [facility] Department (First Floor) for check-in.   OR   [facility] [address] [place], [place] [address] Please go to the [facility] and check-in with the desk attendant.   Magnetic resonance imaging (MRI) is a painless test that produces images of the inside of the body without using Xrays.  During an MRI, strong magnets and radio waves work together in a Data processing manager to form detailed images.   MRI images may provide more details about a medical condition than X-rays, CT scans, and ultrasounds can provide.  You may be given earphones to listen for instructions.  You may eat a light breakfast and take medications as ordered with the exception of furosemide, hydrochlorothiazide , chlorthalidone or spironolactone (or any other fluid pill). If you are undergoing a stress MRI, please avoid  stimulants for 12 hr prior to test. (I.e. Caffeine, nicotine, chocolate, or antihistamine medications)  If your provider has ordered anti-anxiety medications for this test, then you will need a driver.  An IV will be inserted into one of your veins. Contrast material will be injected into your IV. It will leave your body through your urine within a day. You may be told to drink plenty of fluids to help flush the contrast material out of your system.  You will be asked to remove all metal, including: Watch, jewelry, and other metal objects including hearing aids, hair pieces and dentures. Also wearable glucose monitoring systems (ie. Freestyle Libre and Omnipods) (Braces and fillings normally are not a problem.)   TEST WILL TAKE APPROXIMATELY 1 HOUR  PLEASE NOTIFY SCHEDULING AT LEAST 24 HOURS IN ADVANCE IF YOU ARE UNABLE TO KEEP YOUR APPOINTMENT. [phone]  For more information and frequently asked questions, please visit our website : [email or web address]  Please call the Cardiac Imaging Nurse Navigators with any questions/concerns. [phone] Office    Follow-Up: At [facility], you and your health needs are our priority.  As part of our continuing mission to provide you with exceptional heart care, our providers are all part of one team.  This team includes your primary Cardiologist (physician) and Advanced Practice Providers or APPs (Physician Assistants and Nurse Practitioners) who all work together to provide you with the care you need, when you need it.  Your next appointment:   1 year(s)  Provider:   You may see None  or one of the following Advanced Practice Providers on your designated Care Team:   [place], PA-C  [place], [place] Theotis Flake, [place]     We recommend signing up for the patient portal called "MyChart".  Sign up information is provided on this After Visit Summary.  MyChart is used to connect with patients for Virtual  Visits (Telemedicine).  Patients are able to view lab/test results, encounter notes, upcoming appointments, etc.  Non-urgent messages can be sent to your provider as well.   To learn more about what you can do with MyChart, go to [email or web address].   Other Instructions Thank you for choosing [facility]!

## 2024-01-28 NOTE — Progress Notes (Signed)
 Cardiology Office Note:  .   Date:  01/28/2024  ID:  Lulie, Hurd November 01, 1940, MRN [id number] PCP: Tobi Fortes, MD  [facility] Providers Cardiologist:  Janelle Mediate, MD {  History of Present Illness: .   Nicole Good is a 83 y.o. female  with PMHx of mild  asymmetric LVH/G1DD (ECHO 11/2022),  palpitations (2021 Holter: NSR with short run of SVT, <1% PVC/PAC), HTN, Hx of DVT/PE, sleep apnea, hypertriglyceridemia, prediabetes, CKD stage IIIb, Barrett's esophagus, osteoporosis, and fatigue reports to office for follow up.   Last seen in heartcare OV 10/17/2022 with Ronald Cockayne, NP for follow-up on palpitations.  At that time, noted fatigue and previous 2 months of worsening palpitations.  However, PCP Dr. Kermit Ped placed her on Toprol -XL 12.5 mg resulting in palpitations resolving but continued to have fatigue.  Continued on Eliquis  2.5 mg BID, HCTZ 25 mg, losartan  50 mg, Toprol -XL 12.5 mg and Crestor 10 mg. Follow-up echo showed EF 65 to 70%, mild basal anterior septal asymmetrical LVH with mild chordal SAM, mildly turbulent LVOT flow, G1DD, LA mildly dilated, mild MVR and TVR.   Today, denied any cardiac symptoms including chest pain, shortness of breath, palpitations, syncope, presyncope, dizziness, orthopnea, PND, swelling, acute bleeding, or claudication. Reports compliance with medications. Reports she is trying to lose weight using weight watcher and exercise 5x/week for 45 mins of cardio plus weights. Reports healthy dietary habitats. Denies tobacco use/Binging ETOH/drug use. Lives with husband and able to care for self. Able to walk around grocery store without any concerns.    Studies Reviewed: Aaron Aas   EKG Interpretation Date/Time:  Thursday Jan 28 2024 13:07:07 EDT Ventricular Rate:  54 PR Interval:  144 QRS Duration:  66 QT Interval:  414 QTC Calculation: 392 R Axis:   23  Text Interpretation: Sinus bradycardia When compared with ECG of 27-Jul-2022 11:15, No  significant change was found Confirmed by Odetta Benes ([id number]) on 01/28/2024 1:10:43 PM   ECHO 11/2022 IMPRESSIONS   1. Mild basal anteroseptal hypertrophy with mild chordal SAM. There is  mildly turbulent LVOT flow but no significant dynamic gradient. . Left  ventricular ejection fraction, by estimation, is 65 to 70%. The left  ventricle has normal function. The left  ventricle has no regional wall motion abnormalities. There is mild  asymmetric left ventricular hypertrophy of the basal-septal segment. Left  ventricular diastolic parameters are consistent with Grade I diastolic  dysfunction (impaired relaxation).   2. Right ventricular systolic function is normal. The right ventricular  size is normal.   3. Left atrial size was mildly dilated.   4. The mitral valve is normal in structure. Mild mitral valve  regurgitation. No evidence of mitral stenosis.   5. The tricuspid valve is abnormal.   6. The aortic valve is tricuspid. Aortic valve regurgitation is not  visualized. No aortic stenosis is present.   7. The inferior vena cava is normal in size with greater than 50%  respiratory variability, suggesting right atrial pressure of 3 mmHg.   Holter Monitor 02/2020 NSR Average HR 60 bpm PAC/PVC;s < 1% total beats Rare short bursts atrial tachycardia longest only 13 seconds No significant sustained arrhythmias    Physical Exam:   VS:  BP 110/60 (BP Location: Left Arm, Patient Position: Sitting, Cuff Size: Normal)   Pulse (!) 54   Ht 5\' 4"  (1.626 m)   Wt 181 lb (82.1 kg)   SpO2 99%   BMI 31.07 kg/m  Wt Readings from Last 3 Encounters:  01/28/24 181 lb (82.1 kg)  01/13/24 182 lb 6.4 oz (82.7 kg)  12/23/23 184 lb (83.5 kg)    GEN: Well nourished, well developed in no acute distress NECK: No JVD; No carotid bruits CARDIAC: RRR, no murmurs, rubs, gallops RESPIRATORY:  Clear to auscultation without rales, wheezing or rhonchi  ABDOMEN: Soft, non-tender,  non-distended EXTREMITIES:  No edema; No deformity   ASSESSMENT AND PLAN: .    Palpitations Holter monitor 02/2020 showed NSR, avg HR 60's, short runs of SVT with the PAC and PVC burden less than 1%, no sustained arrhythmias. Denies any palpations.  Continue on metoprolol  25 mg daily  HTN 11/2023: K 4.1, Cr 1.50 (baseline Cr 1.3 - 1.6) BP in office today 110/60  Continue on daily dose of hydrochlorothiazide  25 mg, Losartan  50 mg, and Toprol  XL 25 mg   HLD 11/2023: AST/ALT WNL, 01/2024: LDL 108, goal < 100 Currently on Crestor 10 mg daily. Will increase Crestor to 20 mg daily Encouraged to continue healthy eating habitats and exercise  LVH  Diastolic Dysfunction 11/2022 Echo: EF 65 to 70%, mild basal anterior septal asymmetrical LVH with mild chordal SAM, mildly turbulent LVOT flow, G1DD, LA mildly dilated, mild MVR and TVR.  Denied any SOB, dizziness, or syncope. Able to work out for 45 min of cardio without any concerns.   Discussed hypertrophy could be 2/2 to aging, HTN, or HOCM. Informed that HOCM is less likely with exercise tolerance. Patient would prefer to get further testing to rule out HOCM. Ordered cardiac MRI.   Hx of DVT/PE 11/2023: CBC WNL No sign of bleeding Continue on Eliquis  2.5 mg twice daily, which is the appropriate dose given her age and renal function.   CKD S3B 11/2023: K 4.1, Cr 1.50 (baseline Cr 1.3 - 1.6) Continue to be followed by nephrology  Prediabetes  01/2024: A1C 6.3 Managed by primary care  Encouraged lifestyle modifications.     Dispo: Ordered Cardiac MRI & Hgb within 2 week of testing. Increased Crestor 20 mg. F/U labs in 3 m/o for FLP and CMP (Will need to submit order after Hgb completed); Follow up in 1 year per patient preference.   Signed, Metta Actis, PA-C

## 2024-02-05 ENCOUNTER — Ambulatory Visit (HOSPITAL_COMMUNITY): Admission: RE | Admit: 2024-02-05 | Source: Ambulatory Visit

## 2024-02-10 ENCOUNTER — Encounter: Payer: Self-pay | Admitting: Internal Medicine

## 2024-02-10 NOTE — Assessment & Plan Note (Signed)
 Followed by nephrology.  Renal function stable on labs from March.

## 2024-02-10 NOTE — Assessment & Plan Note (Signed)
 A1c 6.4 on labs from November 2024.  Repeat A1c ordered today.

## 2024-02-10 NOTE — Assessment & Plan Note (Signed)
 Adequately controlled on current antihypertensive regimen.  No medication changes are indicated today.

## 2024-02-10 NOTE — Assessment & Plan Note (Signed)
 Stable.  She remains on pantoprazole  40 mg daily.  She underwent repeat EGD in February with no acute findings identified.

## 2024-02-10 NOTE — Assessment & Plan Note (Signed)
 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  She remains on Eliquis  2.5 mg twice daily.

## 2024-02-10 NOTE — Assessment & Plan Note (Signed)
 Lipid panel last updated in November 2024.  Total cholesterol 166 and LDL 91.  She is currently prescribed rosuvastatin  10 mg daily.  Repeat lipid panel ordered today.

## 2024-04-04 ENCOUNTER — Other Ambulatory Visit: Payer: Self-pay | Admitting: Physician Assistant

## 2024-04-04 ENCOUNTER — Ambulatory Visit (HOSPITAL_COMMUNITY)
Admission: RE | Admit: 2024-04-04 | Discharge: 2024-04-04 | Disposition: A | Discharge status: Home / Self Care | Source: Ambulatory Visit | Attending: Physician Assistant | Admitting: Physician Assistant

## 2024-04-04 ENCOUNTER — Ambulatory Visit: Payer: Self-pay | Admitting: Physician Assistant

## 2024-04-04 DIAGNOSIS — I517 Cardiomegaly: Secondary | ICD-10-CM

## 2024-04-04 MED ORDER — GADOBUTROL 1 MMOL/ML IV SOLN
8.0000 mL | Freq: Once | INTRAVENOUS | Status: AC | PRN
  Administered 2024-04-04: 8 mL via INTRAVENOUS

## 2024-04-14 ENCOUNTER — Other Ambulatory Visit: Payer: Self-pay | Admitting: Internal Medicine

## 2024-04-14 DIAGNOSIS — Z86718 Personal history of other venous thrombosis and embolism: Secondary | ICD-10-CM

## 2024-04-14 DIAGNOSIS — Z7901 Long term (current) use of anticoagulants: Secondary | ICD-10-CM

## 2024-04-15 ENCOUNTER — Other Ambulatory Visit: Payer: Self-pay | Admitting: Internal Medicine

## 2024-04-15 DIAGNOSIS — I493 Ventricular premature depolarization: Secondary | ICD-10-CM

## 2024-04-19 ENCOUNTER — Other Ambulatory Visit: Payer: Self-pay | Admitting: Internal Medicine

## 2024-04-19 DIAGNOSIS — Z7901 Long term (current) use of anticoagulants: Secondary | ICD-10-CM

## 2024-04-19 DIAGNOSIS — Z86718 Personal history of other venous thrombosis and embolism: Secondary | ICD-10-CM

## 2024-04-20 ENCOUNTER — Other Ambulatory Visit: Payer: Self-pay | Admitting: General Practice

## 2024-04-20 DIAGNOSIS — Z7901 Long term (current) use of anticoagulants: Secondary | ICD-10-CM

## 2024-04-20 DIAGNOSIS — Z86718 Personal history of other venous thrombosis and embolism: Secondary | ICD-10-CM

## 2024-04-20 NOTE — Telephone Encounter (Signed)
 Copied from CRM [id number]. Topic: Clinical - Medication Refill >> Apr 20, 2024 10:01 AM Sasha H wrote: Medication: apixaban  (ELIQUIS ) 2.5 MG TABS tablet  Has the patient contacted their pharmacy? Yes (Agent: If no, request that the patient contact the pharmacy for the refill. If patient does not wish to contact the pharmacy document the reason why and proceed with request.) (Agent: If yes, when and what did the pharmacy advise?)  This is the patient's preferred pharmacy:  [id number] [place], [place] - [address] [place] [address] Phone: [phone] Fax: [phone]  Is this the correct pharmacy for this prescription? Yes If no, delete pharmacy and type the correct one.   Has the prescription been filled recently? Yes  Is the patient out of the medication? Yes  Has the patient been seen for an appointment in the last year OR does the patient have an upcoming appointment? Yes  Can we respond through MyChart? No  Agent: Please be advised that Rx refills may take up to 3 business days. We ask that you follow-up with your pharmacy.

## 2024-04-24 ENCOUNTER — Other Ambulatory Visit: Payer: Self-pay | Admitting: Internal Medicine

## 2024-04-24 DIAGNOSIS — Z7901 Long term (current) use of anticoagulants: Secondary | ICD-10-CM

## 2024-04-24 DIAGNOSIS — Z86718 Personal history of other venous thrombosis and embolism: Secondary | ICD-10-CM

## 2024-04-25 ENCOUNTER — Other Ambulatory Visit: Payer: Self-pay

## 2024-04-25 ENCOUNTER — Ambulatory Visit: Payer: Self-pay

## 2024-04-25 ENCOUNTER — Other Ambulatory Visit: Payer: Self-pay | Admitting: Internal Medicine

## 2024-04-25 DIAGNOSIS — Z7901 Long term (current) use of anticoagulants: Secondary | ICD-10-CM

## 2024-04-25 DIAGNOSIS — Z86718 Personal history of other venous thrombosis and embolism: Secondary | ICD-10-CM

## 2024-04-25 MED ORDER — APIXABAN 2.5 MG PO TABS: 2.5000 mg | ORAL_TABLET | Freq: Two times a day (BID) | ORAL | 0 refills | Status: DC

## 2024-04-25 NOTE — Telephone Encounter (Signed)
 Message from [place] [facility] sent at 04/25/2024  9:27 AM EDT  Summary: Blood thinner, out of medication for seven days    Reason for Triage: Reason for CRM: Received call from patient, checking status of medication, apixaban  (ELIQUIS ) 2.5 MG TABS tablet.  Per patient states pharmacy has made several requests for medication refill, as well as she has.  Patient is patient of Dr. Melvenia, who is no longer with the office, has an upcoming appointment on 07/15/24, with Leita Longs.  Patient reports she has now been without the medication for 7 days, and went to pharmacy as of most recent on yesterday, 04/24/24, and no response from office regarding refill of medication.  Patient is concerned, and would like a status update as soon as possible.  Patient is requesting this to be called to pharmacy instead of e-scribing.  Can be reached at [phone].  Patient is aware of same day call back.  Pharmacy:  [id number] [place], [place] - [address] [place] [address] Phone: [phone] Fax: [phone]

## 2024-04-26 NOTE — Telephone Encounter (Signed)
 Pt advised with verbal understanding

## 2024-05-19 ENCOUNTER — Telehealth: Payer: Self-pay

## 2024-05-19 NOTE — Telephone Encounter (Signed)
 Copied from CRM [id number]. Topic: General - Other >> May 19, 2024  9:17 AM Jasmin G wrote: Reason for CRM: Pt called to [facility] [address] [place], [place] [address], [phone].

## 2024-05-23 ENCOUNTER — Other Ambulatory Visit: Payer: Self-pay

## 2024-05-23 MED ORDER — COVID-19 MRNA VAC-TRIS(PFIZER) 30 MCG/0.3ML IM SUSY
0.3000 mL | PREFILLED_SYRINGE | Freq: Once | INTRAMUSCULAR | 0 refills | Status: AC
Start: 2024-05-23 — End: 2024-05-23

## 2024-05-23 NOTE — Telephone Encounter (Signed)
 No answer no voice mail

## 2024-06-11 ENCOUNTER — Other Ambulatory Visit: Payer: Self-pay | Admitting: Internal Medicine

## 2024-06-11 DIAGNOSIS — I10 Essential (primary) hypertension: Secondary | ICD-10-CM

## 2024-07-05 ENCOUNTER — Other Ambulatory Visit: Payer: Self-pay

## 2024-07-05 ENCOUNTER — Other Ambulatory Visit: Payer: Self-pay | Admitting: Internal Medicine

## 2024-07-05 DIAGNOSIS — I493 Ventricular premature depolarization: Secondary | ICD-10-CM

## 2024-07-05 DIAGNOSIS — K5904 Chronic idiopathic constipation: Secondary | ICD-10-CM

## 2024-07-15 ENCOUNTER — Ambulatory Visit

## 2024-07-15 VITALS — BP 131/67 | HR 58 | Ht 64.0 in | Wt 181.0 lb

## 2024-07-15 DIAGNOSIS — I10 Essential (primary) hypertension: Secondary | ICD-10-CM | POA: Diagnosis not present

## 2024-07-15 DIAGNOSIS — M542 Cervicalgia: Secondary | ICD-10-CM

## 2024-07-15 DIAGNOSIS — M545 Low back pain, unspecified: Secondary | ICD-10-CM | POA: Diagnosis not present

## 2024-07-15 MED ORDER — TIZANIDINE HCL 4 MG PO TABS: 2.0000 mg | ORAL_TABLET | Freq: Four times a day (QID) | ORAL | 0 refills | Status: AC | PRN

## 2024-07-15 NOTE — Progress Notes (Signed)
 Established Patient Office Visit  Subjective   Patient ID: Nicole Good, female    DOB: [birth year]  Age: 83 y.o. MRN: [id number]  Chief Complaint  Patient presents with   Medical Management of Chronic Issues    6 month follow up    HPI Discussed the use of AI scribe software for clinical note transcription with the patient, who gave verbal consent to proceed.  History of Present Illness    Nicole Good is an 83 year old female who presents with discomfort in the lower back and neck.  Axial musculoskeletal pain - Discomfort localized to the lower back and neck - Pain present across the top of the buttocks - No radiation of pain down the legs - No burning sensation during urination or unusual odor  Cervicalgia and neck dysfunction - Intermittent 'catch' in the neck with certain movements - History of neck muscle strain - Previous physical therapy with prescribed exercises - Cervical spine x-ray earlier this year showed narrowing and degeneration  Eyelid ptosis and surgical history - History of right eyelid drooping for approximately 28 years - Three surgeries performed on the right eyelid for ptosis - Extensive workup including MRI and brain scans ruled out stroke or other neurological causes  Nutritional intake and weight stability - Dietary change to one meal per day for the past two and a half months - No change in weight despite dietary modification - Subjective improvement in well-being with current diet  Physical activity - Regular exercise at home, including treadmill and weights, five days per week     Patient Active Problem List   Diagnosis Date Noted   Acute bilateral low back pain without sciatica 07/20/2024   Cervicalgia 12/14/2023   Heart palpitations 10/07/2022   Constipation 02/13/2022   Obesity (BMI 30-39.9) 02/13/2022   Hyperlipidemia 10/04/2021   MGUS (monoclonal gammopathy of unknown significance) 01/15/2021   Abnormal EKG 02/22/2020    Ventricular premature beats 02/22/2020   Current mild episode of major depressive disorder without prior episode 02/22/2020   Prediabetes 11/24/2019   Post-menopausal 11/24/2019   Fatigue 08/23/2019   Stage 3b chronic kidney disease (HCC) 07/29/2019   Barrett's esophagus without dysplasia 07/27/2018   Gastritis 07/27/2018   Vertebrogenic low back pain 06/17/2018   Sleep apnea 06/02/2018   Dry eye syndrome of both lacrimal glands 12/06/2017   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   Bilateral pseudophakia 12/06/2017   Other pulmonary embolism without acute cor pulmonale (HCC) 12/04/2015   Anticoagulant long-term use 12/04/2015   Deep venous thrombosis (HCC) 12/03/2015   History of pulmonary embolism 12/03/2015   H/O deep venous thrombosis 12/03/2015   Osteoarthritis, knee 05/06/2012   Body mass index (BMI) 26.0-26.9, adult 02/05/2012   Essential hypertension 03/21/2011   Hypercholesterolemia 03/21/2011   Benign lipomatous neoplasm, 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  Constitutional:      Appearance: Normal appearance.  HENT:  Head: Normocephalic.  Eyes:     Extraocular Movements: Extraocular movements intact.     Pupils: Pupils are equal, round, and reactive to light.  Cardiovascular:     Rate and Rhythm: Normal rate and regular rhythm.  Pulmonary:     Effort: Pulmonary effort is normal.     Breath sounds: Normal breath sounds.  Musculoskeletal:     Cervical back: Normal range of motion and neck supple.  Neurological:     Mental Status: She is  alert and oriented to person, place, and time.  Psychiatric:        Mood and Affect: Mood normal.        Thought Content: Thought content normal.      No results found for any visits on 07/15/24.    The ASCVD Risk score (Arnett DK, et al., 2019) failed to calculate for the following reasons:   The 2019 ASCVD risk score is only valid for ages 75 to 67    Assessment & Plan:   Problem List Items Addressed This Visit       Cardiovascular and Mediastinum   Essential hypertension   Adequately controlled on current antihypertensive regimen.  No medication changes are indicated today.        Other   Cervicalgia - Primary   Chronic neck strain with recurrent symptoms, likely musculoskeletal. No acute trauma. - Epsom salt baths, heating pads, stretching exercises. - Tizanidine for muscle relaxation at bedtime, starting with half a tablet. - Consider MRI of the neck if no improvement.      Relevant Medications   tiZANidine (ZANAFLEX) 4 MG tablet   Acute bilateral low back pain without sciatica   Acute musculoskeletal low back pain without leg radiation or urinary symptoms. Unrelated to cervical spine issues. - Epsom salt baths, heating pads, stretching exercises. - Tizanidine for muscle relaxation at bedtime, starting with half a tablet.      Relevant Medications   tiZANidine (ZANAFLEX) 4 MG tablet   Return in about 6 months (around 01/12/2025) for chronic follow-up with PCP.    Leita Longs, FNP

## 2024-07-20 DIAGNOSIS — M545 Low back pain, unspecified: Secondary | ICD-10-CM | POA: Insufficient documentation

## 2024-07-20 NOTE — Assessment & Plan Note (Signed)
 Adequately controlled on current antihypertensive regimen.  No medication changes are indicated today.

## 2024-07-20 NOTE — Assessment & Plan Note (Signed)
 Chronic neck strain with recurrent symptoms, likely musculoskeletal. No acute trauma. - Epsom salt baths, heating pads, stretching exercises. - Tizanidine for muscle relaxation at bedtime, starting with half a tablet. - Consider MRI of the neck if no improvement.

## 2024-07-20 NOTE — Assessment & Plan Note (Signed)
 Acute musculoskeletal low back pain without leg radiation or urinary symptoms. Unrelated to cervical spine issues. - Epsom salt baths, heating pads, stretching exercises. - Tizanidine for muscle relaxation at bedtime, starting with half a tablet.

## 2024-09-09 ENCOUNTER — Ambulatory Visit: Payer: Self-pay

## 2024-09-09 NOTE — Telephone Encounter (Signed)
Noted patient scheduled

## 2024-09-09 NOTE — Telephone Encounter (Signed)
 FYI Only or Action Required?: FYI only for provider: appointment scheduled on 09/12/2024 at 3 PM.  Patient was last seen in primary care on 07/15/2024 by Bevely Doffing, FNP.  Called Nurse Triage reporting Fatigue.  Symptoms began several weeks ago.  Interventions attempted: Rest, hydration, or home remedies.  Symptoms are: unchanged.  Triage Disposition: See PCP When Office is Open (Within 3 Days)  Patient/caregiver understands and will follow disposition?: Yes  Copied from CRM #[id number]. Topic: Clinical - Red Word Triage >> Sep 09, 2024 10:42 AM Donna BRAVO wrote: Red Word that prompted transfer to Nurse Triage:  Patient has high BP taking medication for the last couple of weeks not feeling well -saliva very thick for 3week -not feeling herself - just wants to feel better -weakness -energy level has gone down -sleepy all day Reason for Disposition  [1] MILD weakness (e.g., does not interfere with ability to work, go to school, normal activities) AND [2] persists > 1 week  Answer Assessment - Initial Assessment Questions Patient reports fatigue for the last 2-3 weeks. Patient is on medication for high blood pressure. Reports her saliva is very thick for the last three weeks. Patient is wanting to be seen by her provider.   1. DESCRIPTION: Describe how you are feeling.     Patient reports weakness-feeling like her energy level is down and sleepy all day.  2. SEVERITY: How bad is it?  Can you stand and walk?     mild 3. ONSET: When did these symptoms begin? (e.g., hours, days, weeks, months)     2-3 weeks ago 4. CAUSE: What do you think is causing the weakness or fatigue? (e.g., not drinking enough fluids, medical problem, trouble sleeping)     unsure 5. NEW MEDICINES:  Have you started on any new medicines recently? (e.g., opioid pain medicines, benzodiazepines, muscle relaxants, antidepressants, antihistamines, neuroleptics, beta blockers)     no 6. OTHER SYMPTOMS:  Do you have any other symptoms? (e.g., chest pain, fever, cough, SOB, vomiting, diarrhea, bleeding, other areas of pain)     no  Protocols used: Weakness (Generalized) and Fatigue-A-AH

## 2024-09-12 ENCOUNTER — Ambulatory Visit (INDEPENDENT_AMBULATORY_CARE_PROVIDER_SITE_OTHER): Payer: Self-pay

## 2024-09-12 DIAGNOSIS — Z86718 Personal history of other venous thrombosis and embolism: Secondary | ICD-10-CM | POA: Diagnosis not present

## 2024-09-12 DIAGNOSIS — Z7901 Long term (current) use of anticoagulants: Secondary | ICD-10-CM

## 2024-09-12 MED ORDER — APIXABAN 2.5 MG PO TABS: 2.5000 mg | ORAL_TABLET | Freq: Two times a day (BID) | ORAL | 2 refills | Status: AC

## 2024-09-12 NOTE — Progress Notes (Signed)
 "  Established Patient Office Visit  Subjective   Patient ID: Nicole Good, female    DOB: 06/20/1941  Age: 84 y.o. MRN: [id number]  Chief Complaint  Patient presents with   Medical Management of Chronic Issues    Pt here for Fatigue     HPI Discussed the use of AI scribe software for clinical note transcription with the patient, who gave verbal consent to proceed.  History of Present Illness    Nicole Good is an 84 year old female who presents with fatigue and thick saliva.  Fatigue - Fatigue described as feeling 'off' and not like herself, particularly regarding energy levels - Symptoms more noticeable during the holiday season and after activities such as waking up early and being on her feet all day - No recent cold or flu symptoms - No associated gastrointestinal symptoms  Thick saliva - Thick saliva occurring for approximately two to three months - Symptom most prominent after consuming beef; also occurs when drinking milk - Alleviated by rinsing mouth with Listerine - Similar episode occurred four years ago after eating steak - Has stopped eating red meat; currently consumes chicken, turkey, and dairy products including low-fat chocolate milk - No associated gastrointestinal symptoms such as upset stomach or diarrhea  Anticoagulation therapy - Currently taking Eliquis  - Has approximately six or seven pills remaining - In process of obtaining Eliquis  from a [place]; medication will take some time to arrive     Patient Active Problem List   Diagnosis Date Noted   Acute bilateral low back pain without sciatica 07/20/2024   Cervicalgia 12/14/2023   Heart palpitations 10/07/2022   Constipation 02/13/2022   Obesity (BMI 30-39.9) 02/13/2022   Hyperlipidemia 10/04/2021   MGUS (monoclonal gammopathy of unknown significance) 01/15/2021   Abnormal EKG 02/22/2020   Ventricular premature beats 02/22/2020   Current mild episode of major depressive disorder  without prior episode 02/22/2020   Prediabetes 11/24/2019   Post-menopausal 11/24/2019   Fatigue 08/23/2019   Stage 3b chronic kidney disease (HCC) 07/29/2019   Barrett's esophagus without dysplasia 07/27/2018   Gastritis 07/27/2018   Vertebrogenic low back pain 06/17/2018   Sleep apnea 06/02/2018   Dry eye syndrome of both lacrimal glands 12/06/2017   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   Bilateral pseudophakia 12/06/2017   Other pulmonary embolism without acute cor pulmonale (HCC) 12/04/2015   Anticoagulant long-term use 12/04/2015   Deep venous thrombosis (HCC) 12/03/2015   History of pulmonary embolism 12/03/2015   H/O deep venous thrombosis 12/03/2015   Osteoarthritis, knee 05/06/2012   Body mass index (BMI) 26.0-26.9, adult 02/05/2012   Essential hypertension 03/21/2011   Hypercholesterolemia 03/21/2011   Benign lipomatous neoplasm, 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  Constitutional:      Appearance: Normal appearance.  HENT:     Head: Normocephalic.  Eyes:     Extraocular Movements: Extraocular movements intact.     Pupils: Pupils are equal, round, and  reactive to light.  Cardiovascular:     Rate and Rhythm: Normal rate and regular rhythm.  Pulmonary:     Effort: Pulmonary effort is normal.     Breath sounds: Normal breath sounds.  Musculoskeletal:     Cervical back: Normal range of motion and neck supple.  Neurological:     Mental Status: She is alert and oriented to person, place, and time.  Psychiatric:        Mood and Affect: Mood normal.         Thought Content: Thought content normal.      No results found for any visits on 09/12/24.  Last CBC Lab Results  Component Value Date   WBC 5.1 11/30/2023   HGB 12.2 11/30/2023   HCT 38.6 11/30/2023   MCV 94.8 11/30/2023   MCH 30.0 11/30/2023   RDW 13.2 11/30/2023   PLT 207 11/30/2023   Last metabolic panel Lab Results  Component Value Date   GLUCOSE 90 11/30/2023   NA 140 11/30/2023   K 4.1 11/30/2023   CL 106 11/30/2023   CO2 25 11/30/2023   BUN 28 (H) 11/30/2023   CREATININE 1.50 (H) 11/30/2023   GFRNONAA 34 (L) 11/30/2023   CALCIUM  9.6 11/30/2023   PHOS 2.6 11/09/2020   PROT 7.5 11/30/2023   ALBUMIN 4.1 11/30/2023   LABGLOB 3.3 11/30/2023   AGRATIO 1.1 11/30/2023   BILITOT 0.4 11/30/2023   ALKPHOS 49 11/30/2023   AST 20 11/30/2023   ALT 17 11/30/2023   ANIONGAP 9 11/30/2023   Last lipids Lab Results  Component Value Date   CHOL 186 01/13/2024   HDL 33 (L) 01/13/2024   LDLCALC 108 (H) 01/13/2024   TRIG 261 (H) 01/13/2024   CHOLHDL 5.6 (H) 01/13/2024   Last hemoglobin A1c Lab Results  Component Value Date   HGBA1C 6.3 (H) 01/13/2024   Last thyroid  functions Lab Results  Component Value Date   TSH 0.508 01/13/2024   FREET4 1.36 01/13/2024   Last vitamin D  Lab Results  Component Value Date   VD25OH 61.1 08/15/2022   Last vitamin B12 and Folate Lab Results  Component Value Date   VITAMINB12 743 07/20/2023   FOLATE 12.9 07/20/2023     The ASCVD Risk score (Arnett DK, et al., 2019) failed to calculate for the following reasons:   The 2019 ASCVD risk score is only valid for ages 28 to 55   * - Cholesterol units were assumed    Assessment & Plan:   Problem List Items Addressed This Visit       Other   Anticoagulant long-term use   Delay in obtaining Eliquis  with limited supply remaining. - Sent prescription for Eliquis  to [facility] on [address] for a month's supply with refills, while awaiting shipment of Eliquis  from mail order.        Relevant Medications   apixaban  (ELIQUIS ) 2.5 MG TABS tablet   H/O deep venous thrombosis   Delay in obtaining Eliquis  with limited supply remaining. - Sent prescription for Eliquis  to [facility] on [address] for a month's supply with refills, while awaiting shipment of Eliquis  from mail order.       Relevant Medications   apixaban  (ELIQUIS ) 2.5 MG TABS tablet    No follow-ups on file.    Leita Longs, FNP  "

## 2024-09-13 NOTE — Assessment & Plan Note (Addendum)
 Delay in obtaining Eliquis  with limited supply remaining. - Sent prescription for Eliquis  to [facility] on [address] for a month's supply with refills, while awaiting shipment of Eliquis  from mail order.

## 2024-11-28 ENCOUNTER — Other Ambulatory Visit

## 2024-12-05 ENCOUNTER — Ambulatory Visit: Admitting: Physician Assistant

## 2025-01-13 ENCOUNTER — Ambulatory Visit
